# Patient Record
Sex: Male | Born: 1935 | Race: Black or African American | Hispanic: No | Marital: Married | Smoking: Former smoker
Health system: Southern US, Community
[De-identification: ages and names within clinical notes are randomized; demographics above are authoritative.]

## PROBLEM LIST (undated history)

## (undated) DIAGNOSIS — C61 Malignant neoplasm of prostate: Secondary | ICD-10-CM

## (undated) DIAGNOSIS — I639 Cerebral infarction, unspecified: Secondary | ICD-10-CM

## (undated) DIAGNOSIS — Z87891 Personal history of nicotine dependence: Secondary | ICD-10-CM

## (undated) DIAGNOSIS — J449 Chronic obstructive pulmonary disease, unspecified: Secondary | ICD-10-CM

## (undated) DIAGNOSIS — I447 Left bundle-branch block, unspecified: Secondary | ICD-10-CM

## (undated) DIAGNOSIS — E119 Type 2 diabetes mellitus without complications: Secondary | ICD-10-CM

## (undated) DIAGNOSIS — I429 Cardiomyopathy, unspecified: Secondary | ICD-10-CM

## (undated) HISTORY — DX: Cerebral infarction, unspecified: I63.9

## (undated) HISTORY — PX: PROSTATE SURGERY: SHX751

## (undated) HISTORY — DX: Malignant neoplasm of prostate: C61

---

## 2000-08-21 ENCOUNTER — Encounter: Admission: RE | Admit: 2000-08-21 | Discharge: 2000-11-19 | Payer: Self-pay | Admitting: Cardiology

## 2001-01-01 ENCOUNTER — Emergency Department (HOSPITAL_COMMUNITY): Admission: EM | Admit: 2001-01-01 | Discharge: 2001-01-01 | Payer: Self-pay | Admitting: Emergency Medicine

## 2005-12-12 ENCOUNTER — Ambulatory Visit: Payer: Self-pay | Admitting: Internal Medicine

## 2006-01-11 ENCOUNTER — Ambulatory Visit: Payer: Self-pay | Admitting: Internal Medicine

## 2006-08-24 ENCOUNTER — Encounter (HOSPITAL_COMMUNITY): Admission: RE | Admit: 2006-08-24 | Discharge: 2006-08-24 | Payer: Self-pay | Admitting: Cardiology

## 2006-12-31 ENCOUNTER — Ambulatory Visit: Payer: Self-pay | Admitting: Internal Medicine

## 2007-02-26 ENCOUNTER — Emergency Department (HOSPITAL_COMMUNITY): Admission: EM | Admit: 2007-02-26 | Discharge: 2007-02-26 | Payer: Self-pay | Admitting: Emergency Medicine

## 2007-05-06 ENCOUNTER — Emergency Department (HOSPITAL_COMMUNITY): Admission: EM | Admit: 2007-05-06 | Discharge: 2007-05-06 | Payer: Self-pay | Admitting: Family Medicine

## 2007-05-27 ENCOUNTER — Ambulatory Visit: Payer: Self-pay | Admitting: Internal Medicine

## 2007-12-02 DIAGNOSIS — H409 Unspecified glaucoma: Secondary | ICD-10-CM | POA: Insufficient documentation

## 2007-12-02 DIAGNOSIS — J439 Emphysema, unspecified: Secondary | ICD-10-CM

## 2007-12-02 DIAGNOSIS — Z8546 Personal history of malignant neoplasm of prostate: Secondary | ICD-10-CM | POA: Insufficient documentation

## 2007-12-04 ENCOUNTER — Encounter: Payer: Self-pay | Admitting: Internal Medicine

## 2007-12-14 ENCOUNTER — Inpatient Hospital Stay (HOSPITAL_COMMUNITY): Admission: AD | Admit: 2007-12-14 | Discharge: 2007-12-18 | Payer: Self-pay | Admitting: Cardiology

## 2007-12-14 ENCOUNTER — Encounter: Payer: Self-pay | Admitting: Emergency Medicine

## 2008-01-02 ENCOUNTER — Encounter: Payer: Self-pay | Admitting: Internal Medicine

## 2010-11-12 ENCOUNTER — Encounter: Payer: Self-pay | Admitting: Cardiology

## 2011-03-07 NOTE — Assessment & Plan Note (Signed)
[facility]                             PULMONARY OFFICE NOTE   BURYL, Dustin Leonard                       MRN:          [id number]  DATE:05/27/2007                            DOB:          Aug 09, 1929    PROBLEM LIST:  1. Chronic obstructive pulmonary disease.  2. History of prostate cancer resection in 1998.  3. Glaucoma.   HISTORY:  He had bronchitis treated at [facility] July 14  with Avelox and Mucinex. He says he is now back to normal. Chest x-ray  May 6 had questioned interstitial densities left lower lobe. Chest x-ray  July 14 at his urgent care visit described stable opacities left base  considering recurrent air space disease or scarring not entirely  excluding a mass. This was explained to him and his daughter. He just  uses albuterol now and on an occasional p.r.n. basis.   MEDICATIONS:  1. Glaucoma eye drops.  2. Metformin 1000 mg b.i.d.  3. Albuterol rescue inhaler.   No medication allergy.   OBJECTIVE:  Weight 174 pounds, blood pressure 124/68, pulse 87, room air  saturation 99%.  I felt a very occasional extra beat, no murmur.  Chest sounded clear.  I found no adenopathy, no edema.   IMPRESSION:  Chronic obstructive pulmonary disease/asthma with recent  exacerbation. Question abnormal x-ray, probably some residual scarring  but appropriate to followup.   PLAN:  1. Schedule PFT.  2. Schedule return in 6 months, earlier p.r.n.     Clinton D. Maple Hudson, MD, Tonny Bollman, FACP  Electronically Signed    CDY/MedQ  DD: 05/27/2007  DT: 05/28/2007  Job #: [id number]   cc:   [name], M.D.

## 2011-03-10 NOTE — Assessment & Plan Note (Signed)
[facility]                             PULMONARY OFFICE NOTE   Dustin, Leonard                       MRN:          [id number]  DATE:12/31/2006                            DOB:          1929-02-17    PROBLEMS:  1. Chronic obstructive pulmonary disease.  2. History of prostate cancer resection in 1998.  3. Glaucoma.   HISTORY:  One year follow up. He reports a good winter. No flu syndrome.  No routine cough. Occasional scant white sputum. No chest pain or blood.  He feels comfortable with routine walking, but gets short of breath if  he tries to hurry or climb stairs.   MEDICATIONS:  1. Lipitor 10 mg.  2. Flovent 44 2 puffs b.i.d.  3. Rescue albuterols used p.r.n.  4. Glimepiride 2 mg.  5. Alphagan.  6. Systone eye drops.  7. Alcon eye drops.   ALLERGIES:  No medication allergy.   OBJECTIVE:  Weight 181 pounds, blood pressure 122/72, pulse regular 86,  room air saturation 99% at rest. Trace wheeze left mid back, cleared  with a little cough. There is no rhonchi or dullness. Work of breathing  was not increased. Heart sounds were regular without murmur. I found no  adenopathy, neck vein distension, or edema.   IMPRESSION:  Asthma with chronic obstructive pulmonary disease,  clinically stable.   PLAN:  We refilled his rescue albuterol inhaler, discussed options, and  scheduled return for one year. He will be needing some reassessment  including pulmonary function tests at that time.     Clinton D. Maple Hudson, MD, Tonny Bollman, FACP  Electronically Signed    CDY/MedQ  DD: 01/05/2007  DT: 01/07/2007  Job #: [id number]   cc:   [name], M.D.

## 2011-03-10 NOTE — Discharge Summary (Signed)
NAME:  Dustin Leonard, Dustin Leonard              ACCOUNT NO.:  [id number]   MEDICAL RECORD NO.:  [id number]          PATIENT TYPE:  INP   LOCATION:  1418                         FACILITY:  [facility]   PHYSICIAN:  [name], M.D.DATE OF BIRTH:  1928/10/27   DATE OF ADMISSION:  12/14/2007  DATE OF DISCHARGE:  12/18/2007                               DISCHARGE SUMMARY   ADMISSION DIAGNOSES:  1. Chronic obstructive pulmonary disease, asthma.  2. Type 2 diabetes.  3. Uncontrolled hypertension.   DISCHARGE DIAGNOSES:  1. Status asthmaticus.  2. Uncontrolled type 2 diabetes.  3. Hypertension.   BRIEF HISTORY/REASON FOR ADMISSION:  This 77 year old gentleman with a  long history of asthma was admitted to the hospital in my absence by Dr.  Orpah Cobb for complaints of severe dyspnea, cough of several days'  duration.  The patient also complained of some wheezing and it did not  improve with his usual inhaler treatment at home.  He came to the  hospital emergency room, was evaluated by Dr. Algie Coffer, and subsequently  admitted to the hospital for further evaluation and treatment of status  asthmaticus.   LABORATORY STUDIES:  Revealed the following, white blood cell count on  initial presentation on December 15, 2007, was 9300, hemoglobin 12.4,  hematocrit 36.4, serum sodium 134, potassium 3.5, chloride 102, CO2 of  23, glucose 203, BUN 14, creatinine 0.94.  Hemoglobin A1c was 12.9.  Cholesterol was 220 total, the HDL cholesterol was 41, and the LDL  cholesterol was 166.   IMAGING:  Electrocardiogram revealed left bundle branch block and some  ectopics were also noted on the patient's telemetry.   HOSPITAL COURSE:  This 78-year gentleman was admitted to the hospital  for treatment of status asthmaticus after initial presentation.  Baseline laboratory studies were obtained and given in the body of the  patient's chart.  He was started on the following medications, Xopenex,  which was  subsequently discontinued.  He was given Atrovent nebulizers  0.5 mL every 6 hours, and he was started on prednisone 20 mg p.o. daily.   The patient was also given Pulmicort 0.5 mg via nebulizer b.i.d. and  this seemed to improve his status asthmaticus and his dyspnea improved.  He had been given IV Solu-Medrol previously and then also prescribed  Lyrica and Avandaryl.  I also gave his other medications as in his  regimen.   The patient gradually began to improve with shortness of breath.  He was  to ambulate in halls and eventually was started on DuoNeb q.i.d.,  Pulmicort 0.5 mg b.i.d., as well as p.o. steroids.  As his condition  improved, the patient was released to home on the following medications  of Avandaryl 4/2 one tablet p.o. q.a.m., Lyrica 50 mg p.o. b.i.d.,  Nasonex DM 60 mg p.o. three times a day, Proair 2 puffs four times a  day, Travatan  0.004 in his left eye at night sustained drops in his eye  as needed.  He was also prescribed home nebulizer with DuoNeb q.i.d. and  Pulmicort 0.5 mg via nebulizer b.i.d.  He will be seen back  in office in  2 weeks.   DISPOSITION AT DISCHARGE:  The patient's condition improved.  He will be  given Ceftin 250 mg p.o. b.i.d. for 4 days and prednisone 10 mg daily  until he is seen back in the office.   The patient is also noted to have some ventricular ectopics as in the  short runs of nonsustained ventricular tachycardia while hospitalized.  This also will be addressed when he is seen in the office.      [name], M.D.  Electronically Signed     JOS/MEDQ  D:  02/05/2008  T:  02/06/2008  Job:  [id number]

## 2011-03-14 ENCOUNTER — Other Ambulatory Visit: Payer: Self-pay | Admitting: Emergency Medicine

## 2011-03-14 ENCOUNTER — Inpatient Hospital Stay (HOSPITAL_COMMUNITY)
Admission: EM | Admit: 2011-03-14 | Discharge: 2011-03-17 | DRG: 065 | Disposition: A | Discharge status: Home Health | Payer: Medicare Other | Attending: Neurology | Admitting: Neurology

## 2011-03-14 ENCOUNTER — Emergency Department (HOSPITAL_COMMUNITY): Payer: Medicare Other

## 2011-03-14 ENCOUNTER — Inpatient Hospital Stay (INDEPENDENT_AMBULATORY_CARE_PROVIDER_SITE_OTHER)
Admission: RE | Admit: 2011-03-14 | Discharge: 2011-03-14 | Disposition: A | Discharge status: Home / Self Care | Payer: Medicare Other | Source: Ambulatory Visit | Attending: Family Medicine | Admitting: Family Medicine

## 2011-03-14 DIAGNOSIS — J4489 Other specified chronic obstructive pulmonary disease: Secondary | ICD-10-CM | POA: Diagnosis present

## 2011-03-14 DIAGNOSIS — J449 Chronic obstructive pulmonary disease, unspecified: Secondary | ICD-10-CM | POA: Diagnosis present

## 2011-03-14 DIAGNOSIS — Z79899 Other long term (current) drug therapy: Secondary | ICD-10-CM

## 2011-03-14 DIAGNOSIS — H409 Unspecified glaucoma: Secondary | ICD-10-CM | POA: Diagnosis present

## 2011-03-14 DIAGNOSIS — G459 Transient cerebral ischemic attack, unspecified: Secondary | ICD-10-CM

## 2011-03-14 DIAGNOSIS — I447 Left bundle-branch block, unspecified: Secondary | ICD-10-CM | POA: Diagnosis present

## 2011-03-14 DIAGNOSIS — Z8546 Personal history of malignant neoplasm of prostate: Secondary | ICD-10-CM

## 2011-03-14 DIAGNOSIS — E119 Type 2 diabetes mellitus without complications: Secondary | ICD-10-CM

## 2011-03-14 DIAGNOSIS — Z87891 Personal history of nicotine dependence: Secondary | ICD-10-CM

## 2011-03-14 DIAGNOSIS — Z006 Encounter for examination for normal comparison and control in clinical research program: Secondary | ICD-10-CM

## 2011-03-14 DIAGNOSIS — I10 Essential (primary) hypertension: Secondary | ICD-10-CM | POA: Diagnosis present

## 2011-03-14 DIAGNOSIS — Z7982 Long term (current) use of aspirin: Secondary | ICD-10-CM

## 2011-03-14 DIAGNOSIS — Z9079 Acquired absence of other genital organ(s): Secondary | ICD-10-CM

## 2011-03-14 DIAGNOSIS — R4789 Other speech disturbances: Secondary | ICD-10-CM | POA: Diagnosis present

## 2011-03-14 DIAGNOSIS — I6350 Cerebral infarction due to unspecified occlusion or stenosis of unspecified cerebral artery: Principal | ICD-10-CM | POA: Diagnosis present

## 2011-03-14 DIAGNOSIS — I428 Other cardiomyopathies: Secondary | ICD-10-CM | POA: Diagnosis present

## 2011-03-14 DIAGNOSIS — E7800 Pure hypercholesterolemia, unspecified: Secondary | ICD-10-CM | POA: Diagnosis present

## 2011-03-14 LAB — COMPREHENSIVE METABOLIC PANEL
Albumin: 3.2 g/dL — ABNORMAL LOW (ref 3.5–5.2)
BUN: 18 mg/dL (ref 6–23)
Calcium: 9.3 mg/dL (ref 8.4–10.5)
Creatinine, Ser: 1.01 mg/dL (ref 0.4–1.5)
Total Protein: 6.8 g/dL (ref 6.0–8.3)

## 2011-03-14 LAB — DIFFERENTIAL
Basophils Relative: 0 % (ref 0–1)
Eosinophils Absolute: 0.1 10*3/uL (ref 0.0–0.7)
Lymphs Abs: 3 10*3/uL (ref 0.7–4.0)
Neutrophils Relative %: 53 % (ref 43–77)

## 2011-03-14 LAB — PROTIME-INR
INR: 1.06 (ref 0.00–1.49)
Prothrombin Time: 14 seconds (ref 11.6–15.2)

## 2011-03-14 LAB — POCT I-STAT, CHEM 8
Calcium, Ion: 1.08 mmol/L — ABNORMAL LOW (ref 1.12–1.32)
Chloride: 106 mEq/L (ref 96–112)
Chloride: 105 mEq/L (ref 96–112)
HCT: 43 % (ref 39.0–52.0)
HCT: 41 % (ref 39.0–52.0)
Potassium: 4.1 mEq/L (ref 3.5–5.1)
Sodium: 136 mEq/L (ref 135–145)

## 2011-03-14 LAB — CBC
MCH: 29.2 pg (ref 26.0–34.0)
MCV: 86.7 fL (ref 78.0–100.0)
Platelets: 138 10*3/uL — ABNORMAL LOW (ref 150–400)
RDW: 13.2 % (ref 11.5–15.5)
WBC: 8.3 10*3/uL (ref 4.0–10.5)

## 2011-03-14 LAB — GLUCOSE, CAPILLARY: Glucose-Capillary: 252 mg/dL — ABNORMAL HIGH (ref 70–99)

## 2011-03-14 LAB — CK TOTAL AND CKMB (NOT AT ARMC)
CK, MB: 1.5 ng/mL (ref 0.3–4.0)
Total CK: 56 U/L (ref 7–232)

## 2011-03-15 ENCOUNTER — Inpatient Hospital Stay (HOSPITAL_COMMUNITY): Payer: Medicare Other

## 2011-03-15 DIAGNOSIS — I6350 Cerebral infarction due to unspecified occlusion or stenosis of unspecified cerebral artery: Secondary | ICD-10-CM

## 2011-03-15 LAB — CBC
HCT: 40.8 % (ref 39.0–52.0)
Hemoglobin: 13.7 g/dL (ref 13.0–17.0)
MCV: 86.1 fL (ref 78.0–100.0)
RDW: 13.2 % (ref 11.5–15.5)
WBC: 6.2 10*3/uL (ref 4.0–10.5)

## 2011-03-15 LAB — COMPREHENSIVE METABOLIC PANEL
Alkaline Phosphatase: 72 U/L (ref 39–117)
BUN: 13 mg/dL (ref 6–23)
CO2: 24 mEq/L (ref 19–32)
Chloride: 103 mEq/L (ref 96–112)
GFR calc non Af Amer: >60 mL/min (ref >60)
Glucose, Bld: 122 mg/dL — ABNORMAL HIGH (ref 70–99)
Potassium: 4.1 mEq/L (ref 3.5–5.1)
Total Bilirubin: 0.4 mg/dL (ref 0.3–1.2)
Total Protein: 6.5 g/dL (ref 6.0–8.3)

## 2011-03-15 LAB — GLUCOSE, CAPILLARY
Glucose-Capillary: 154 mg/dL — ABNORMAL HIGH (ref 70–99)
Glucose-Capillary: 261 mg/dL — ABNORMAL HIGH (ref 70–99)

## 2011-03-15 LAB — URINALYSIS, ROUTINE W REFLEX MICROSCOPIC
Ketones, ur: NEGATIVE mg/dL
Leukocytes, UA: NEGATIVE
Nitrite: NEGATIVE
Specific Gravity, Urine: 1.024 (ref 1.005–1.030)
pH: 5 (ref 5.0–8.0)

## 2011-03-15 LAB — URINE MICROSCOPIC-ADD ON

## 2011-03-15 LAB — HEMOGLOBIN A1C
Hgb A1c MFr Bld: 10.8 % — ABNORMAL HIGH (ref <5.7)
Mean Plasma Glucose: 263 mg/dL — ABNORMAL HIGH (ref <117)

## 2011-03-15 LAB — LIPID PANEL
Cholesterol: 225 mg/dL — ABNORMAL HIGH (ref 0–200)
LDL Cholesterol: 161 mg/dL — ABNORMAL HIGH (ref 0–99)

## 2011-03-15 NOTE — Consult Note (Signed)
NAME:  Dustin Leonard, Dustin Leonard              ACCOUNT NO.:  [id number]  MEDICAL RECORD NO.:  [id number]           PATIENT TYPE:  I  LOCATION:  3041                         FACILITY:  [facility]  PHYSICIAN:  Levie Heritage, MD       DATE OF BIRTH:  [birth year]  DATE OF CONSULTATION:  03/14/2011 DATE OF DISCHARGE:                                CONSULTATION   REFERRING PHYSICIAN:  ER Team.  REASON FOR CONSULTATION:  Stroke code.  CHIEF COMPLAINT:  Sudden onset of slurring of speech.  HISTORY OF PRESENT ILLNESS:  This patient is a 77 year old African American man who noticed some overall not feeling well around 8 o'clock this morning, but around 12 noon time, he was witnessed to have obvious slurring of speech and was brought to Urgent Care Clinic who referred him here.  He reached here around close to 5 p.m. out of the TPA window. A CT scan of the head was performed that did show low-density area in the left basal ganglia reflective of possible subacute infarct.  There was no other neurological deficit except than dysarthria.  The NIH stroke scale was 1.  The TPA was not offered because of out of the TPA window.  PAST MEDICAL HISTORY:  COPD, asthma, type 2 diabetes mellitus, uncontrolled hypertension.  ALLERGIES:  No known drug allergies.  MEDICATIONS:  The patient affirmed that he takes baby aspirin 81 mg every day, however, he will provide the list of detail medications later on.  Currently, there is no close family relative around to provide the list of medications.  FAMILY HISTORY:  Multiple family members with diabetes mellitus.  SOCIAL HISTORY:  Nonsmoker, nondrinker, no drug abuse.  Lives with his spouse.  REVIEW OF SYSTEMS:  Currently, denies any shortness of breath, denies any chest pain.  Denies any nausea, vomiting, diarrhea.  Denies any recent weight loss.  Denies any burning urination.  Denies any fevers. Denies any rashes.  Denies any motor or sensory deficits or any  visual changes.  He only has problem speaking.  Rest of the review of systems unremarkable except noted above in the HPI.  CLINICAL DATA:  His labs are still pending.  His CT scan of the head of the report is still pending, but I have reviewed the CT scan in the CAT scan suite, did not see any intracranial hemorrhage and have seen a low- density area in the left basal ganglia.  PHYSICAL EXAMINATION:  VITAL SIGNS:  His blood pressure 137/78 mmHg with a pulse of 89 per minute. GENERAL:  His NIH stroke scale at 5 p.m. is 104 dysarthria only.  Alert and oriented x3 in no acute distress. HEENT:  Bilateral pupils reactive to light and accommodation. MUSCULOSKELETAL:  It is important to mention that he has mild chronic or possibly congenital weakness of left later rectus other than that the cranial nerves are intact completely. Motor examination reveals 5/5 all over. The sensory examination is intact, admits to feel light touch sensation equally symmetrical and normal all over. The gait was deferred.  IMPRESSION:  A 77 year old man with risk factor of diabetes mellitus and hypertension,  had sudden onset of slurring of speech which probably started 8 a.m. this morning, but was obvious around noon today and the CT scan is showing the low-density area in the left hemisphere.  My impression is that his subacute stroke is likely the result of small-vessel disease, and he is out of a TPA window period.  Therefore it is not offered.  PLAN: 1. Please give the patient 75 mg of daily Plavix as he has been on     aspirin before. 2. Permissive hypertension for today, not to treat unless systolic is     more than to 200 mmHg. 3. Please send lipid panel HbA1c. 4. MRI and MRA of the brain with Doppler carotid and cardiac echo. 5. Stroke Team will follow the patient up from tomorrow morning, I     will proceed as the case progresses.          ______________________________ Levie Heritage,  MD     WS/MEDQ  D:  03/14/2011  T:  03/15/2011  Job:  [id number]  Electronically Signed by Levie Heritage MD on 03/15/2011 03:14:40 PM

## 2011-03-16 LAB — GLUCOSE, CAPILLARY
Glucose-Capillary: 169 mg/dL — ABNORMAL HIGH (ref 70–99)
Glucose-Capillary: 167 mg/dL — ABNORMAL HIGH (ref 70–99)
Glucose-Capillary: 183 mg/dL — ABNORMAL HIGH (ref 70–99)

## 2011-03-17 LAB — GLUCOSE, CAPILLARY: Glucose-Capillary: 168 mg/dL — ABNORMAL HIGH (ref 70–99)

## 2011-03-18 ENCOUNTER — Inpatient Hospital Stay (HOSPITAL_COMMUNITY)
Admission: EM | Admit: 2011-03-18 | Discharge: 2011-03-22 | DRG: 065 | Disposition: A | Discharge status: Home / Self Care | Payer: Medicare Other | Attending: Cardiology | Admitting: Cardiology

## 2011-03-18 ENCOUNTER — Emergency Department (HOSPITAL_COMMUNITY): Payer: Medicare Other

## 2011-03-18 DIAGNOSIS — J449 Chronic obstructive pulmonary disease, unspecified: Secondary | ICD-10-CM | POA: Diagnosis present

## 2011-03-18 DIAGNOSIS — R29810 Facial weakness: Secondary | ICD-10-CM | POA: Diagnosis present

## 2011-03-18 DIAGNOSIS — I10 Essential (primary) hypertension: Secondary | ICD-10-CM | POA: Diagnosis present

## 2011-03-18 DIAGNOSIS — E7800 Pure hypercholesterolemia, unspecified: Secondary | ICD-10-CM | POA: Diagnosis present

## 2011-03-18 DIAGNOSIS — I447 Left bundle-branch block, unspecified: Secondary | ICD-10-CM | POA: Diagnosis present

## 2011-03-18 DIAGNOSIS — J4489 Other specified chronic obstructive pulmonary disease: Secondary | ICD-10-CM | POA: Diagnosis present

## 2011-03-18 DIAGNOSIS — R471 Dysarthria and anarthria: Secondary | ICD-10-CM | POA: Diagnosis present

## 2011-03-18 DIAGNOSIS — E119 Type 2 diabetes mellitus without complications: Secondary | ICD-10-CM | POA: Diagnosis present

## 2011-03-18 DIAGNOSIS — Z87891 Personal history of nicotine dependence: Secondary | ICD-10-CM

## 2011-03-18 DIAGNOSIS — I428 Other cardiomyopathies: Secondary | ICD-10-CM | POA: Diagnosis present

## 2011-03-18 DIAGNOSIS — Z7982 Long term (current) use of aspirin: Secondary | ICD-10-CM

## 2011-03-18 DIAGNOSIS — I6350 Cerebral infarction due to unspecified occlusion or stenosis of unspecified cerebral artery: Principal | ICD-10-CM | POA: Diagnosis present

## 2011-03-18 DIAGNOSIS — Z8673 Personal history of transient ischemic attack (TIA), and cerebral infarction without residual deficits: Secondary | ICD-10-CM

## 2011-03-18 LAB — DIFFERENTIAL
Basophils Absolute: 0 10*3/uL (ref 0.0–0.1)
Eosinophils Relative: 2 % (ref 0–5)
Lymphocytes Relative: 32 % (ref 12–46)
Lymphs Abs: 2.2 10*3/uL (ref 0.7–4.0)
Neutro Abs: 3.7 10*3/uL (ref 1.7–7.7)
Neutrophils Relative %: 53 % (ref 43–77)

## 2011-03-18 LAB — CK TOTAL AND CKMB (NOT AT ARMC): CK, MB: 1.6 ng/mL (ref 0.3–4.0)

## 2011-03-18 LAB — URINALYSIS, ROUTINE W REFLEX MICROSCOPIC
Ketones, ur: 15 mg/dL — AB
Leukocytes, UA: NEGATIVE
Nitrite: NEGATIVE
Specific Gravity, Urine: 1.035 — ABNORMAL HIGH (ref 1.005–1.030)
Urobilinogen, UA: 1 mg/dL (ref 0.0–1.0)

## 2011-03-18 LAB — CBC
HCT: 42.1 % (ref 39.0–52.0)
MCV: 86.8 fL (ref 78.0–100.0)
RBC: 4.85 MIL/uL (ref 4.22–5.81)
RDW: 12.9 % (ref 11.5–15.5)
WBC: 7 10*3/uL (ref 4.0–10.5)

## 2011-03-18 LAB — BASIC METABOLIC PANEL
Chloride: 100 mEq/L (ref 96–112)
GFR calc non Af Amer: >60 mL/min (ref >60)
Potassium: 4.8 mEq/L (ref 3.5–5.1)
Sodium: 134 mEq/L — ABNORMAL LOW (ref 135–145)

## 2011-03-18 LAB — URINE MICROSCOPIC-ADD ON

## 2011-03-18 LAB — PROTIME-INR: INR: 1.04 (ref 0.00–1.49)

## 2011-03-18 LAB — TROPONIN I: Troponin I: <0.3 ng/mL (ref <0.30)

## 2011-03-18 LAB — GLUCOSE, CAPILLARY

## 2011-03-18 LAB — APTT: aPTT: 29 seconds (ref 24–37)

## 2011-03-19 ENCOUNTER — Inpatient Hospital Stay (HOSPITAL_COMMUNITY): Payer: Medicare Other

## 2011-03-19 ENCOUNTER — Other Ambulatory Visit (HOSPITAL_COMMUNITY): Payer: Medicare Other

## 2011-03-19 LAB — CBC
HCT: 36.4 % — ABNORMAL LOW (ref 39.0–52.0)
Hemoglobin: 11.8 g/dL — ABNORMAL LOW (ref 13.0–17.0)
RBC: 4.2 MIL/uL — ABNORMAL LOW (ref 4.22–5.81)
WBC: 5.2 10*3/uL (ref 4.0–10.5)

## 2011-03-19 LAB — BASIC METABOLIC PANEL
CO2: 24 mEq/L (ref 19–32)
Chloride: 104 mEq/L (ref 96–112)
GFR calc non Af Amer: >60 mL/min (ref >60)
Glucose, Bld: 208 mg/dL — ABNORMAL HIGH (ref 70–99)
Potassium: 4 mEq/L (ref 3.5–5.1)
Sodium: 136 mEq/L (ref 135–145)

## 2011-03-19 LAB — GLUCOSE, CAPILLARY: Glucose-Capillary: 228 mg/dL — ABNORMAL HIGH (ref 70–99)

## 2011-03-20 ENCOUNTER — Inpatient Hospital Stay (HOSPITAL_COMMUNITY): Payer: Medicare Other

## 2011-03-20 LAB — GLUCOSE, CAPILLARY
Glucose-Capillary: 203 mg/dL — ABNORMAL HIGH (ref 70–99)
Glucose-Capillary: 159 mg/dL — ABNORMAL HIGH (ref 70–99)
Glucose-Capillary: 187 mg/dL — ABNORMAL HIGH (ref 70–99)

## 2011-03-20 LAB — URINE CULTURE: Colony Count: 5000

## 2011-03-21 LAB — CBC
MCH: 28.5 pg (ref 26.0–34.0)
MCHC: 33 g/dL (ref 30.0–36.0)
Platelets: 144 10*3/uL — ABNORMAL LOW (ref 150–400)

## 2011-03-21 LAB — CULTURE, BLOOD (ROUTINE X 2)

## 2011-03-21 LAB — BASIC METABOLIC PANEL
Calcium: 8.5 mg/dL (ref 8.4–10.5)
Creatinine, Ser: 0.94 mg/dL (ref 0.4–1.5)
GFR calc Af Amer: >60 mL/min (ref >60)

## 2011-03-21 LAB — GLUCOSE, CAPILLARY: Glucose-Capillary: 173 mg/dL — ABNORMAL HIGH (ref 70–99)

## 2011-03-22 LAB — GLUCOSE, CAPILLARY
Glucose-Capillary: 198 mg/dL — ABNORMAL HIGH (ref 70–99)
Glucose-Capillary: 246 mg/dL — ABNORMAL HIGH (ref 70–99)
Glucose-Capillary: 136 mg/dL — ABNORMAL HIGH (ref 70–99)

## 2011-04-13 NOTE — Discharge Summary (Signed)
NAME:  SANJIV, CASTORENA              ACCOUNT NO.:  [id number]  MEDICAL RECORD NO.:  [id number]           PATIENT TYPE:  I  LOCATION:  3035                         FACILITY:  [facility]  PHYSICIAN:  Eldra Word N. Sharyn Lull, M.D. 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  DISCHARGE DIAGNOSES:  A status post left corona radiata cerebrovascular accident, hypertension, dilated cardiomyopathy, diabetes mellitus, hypercholesteremia, chronic obstructive pulmonary disease.  DISCHARGE MEDICATIONS: 1. Enteric-coated aspirin 81 mg 1 tablet daily. 2. Plavix 75 mg 1 tablet daily. 3. Ramipril 2.5 mg 1 tablet daily. 4. Coreg 3.125 mg 1 tablet twice daily. 5. Crestor 20 mg tablet daily. 6. Pepcid 20 mg 1 tablet twice daily.  DIET:  Low-salt, low-cholesterol 1800 calories ADA diet.  The patient has been advised to monitor BP and blood sugar daily.  Follow up with me in 1 week.  Follow up with Dr. Pearlean Brownie in 2 weeks.  Condition at discharge is stable.  BRIEF HISTORY AND HOSPITAL COURSE:  Mr. Ouzts is a 77 year old black male with past medical history significant for left subcortical infarct with dysarthria, improved during hospitalization; hypertension; diabetes mellitus; hypercholesteremia; dilated cardiomyopathy was admitted by Dr. Algie Coffer on Mar 18, 2011, because of recurrence of slurred speech and slight difficulty in walking.  There was no clear hemiparesis.  Right facial weakness was noted, however, he was brought back to the emergency room for evaluation.  CT of his head showed expected interval changes and left subcortical infarct but no signs of recurrent infarction or no signs of hemorrhage.  NIH stroke score was 2.  The patient was on point study, but due to recurrent  symptoms, the patient was started on Plavix and point study was discontinued.  PAST MEDICAL HISTORY:  As above.  Family history is noncontributory.  SOCIAL HISTORY:  He is married, retired, lives with his wife.  Quit smoking 15+ years ago.  ALLERGIES:  No known drug allergies.  MEDICATION AT HOME:  He was on Crestor, ramipril, Protonix, metformin, Coreg, aspirin and point study at home.  PHYSICAL EXAMINATION:  GENERAL:  He was awake, dysarthric. VITAL SIGNS:  Blood pressure was 130/70, pulse was 70 regular. HEENT:  Conjunctivae was pink. NECK:  Supple, no JVD, no bruit. LUNGS:  Clear bilaterally. CARDIOVASCULAR:  S1, S2 was normal.  There was soft systolic murmur. EXTREMITIES:  There is no clubbing, cyanosis or edema. NEUROLOGIC:  Grossly intact except slurred speech.  LABORATORY DATA:  His hemoglobin was 11.8, hematocrit 36.4, white count of 5.2.  Sodium was 136, potassium 4.0, glucose 208, BUN 14, creatinine 0.91.  Blood cultures were negative.  Urine cultures were negative.  MRI of the brain showed increase in size of the infarct involving the superior aspect of the left basal ganglia and left corona radiata. There was no evidence of intracranial hemorrhage, mild small vessel disease.  BRIEF HOSPITAL COURSE:  The patient was admitted to telemetry unit. Neurology consultation was obtained.  Repeat MRI was  obtained, which showed slight progression of infarct.  Speech therapy and OT/PT consultation was obtained.  The patient was started on Plavix and low- dose aspirin.  The patient's speech improved.  The patient did not have any further episodes of neuro deficits.  The patient will be discharged home on above medications.  Initially family agreed for point study, but finally after discussing his wife with her son, they decided to stop the point study and wanted to be on Plavix and aspirin.  The patient will be discharged home on above medications and will be followed up in  my office in 1 week and Neurology in 2 weeks.     Eduardo Osier. Sharyn Lull, M.D.     MNH/MEDQ  D:  03/22/2011  T:  03/23/2011  Job:  [id number]  Electronically Signed by Rinaldo Cloud M.D. on 04/13/2011 09:38:25 AM

## 2011-04-13 NOTE — Discharge Summary (Signed)
NAME:  Dustin Leonard, VARMA              ACCOUNT NO.:  [id number]  MEDICAL RECORD NO.:  [id number]           PATIENT TYPE:  I  LOCATION:  3041                         FACILITY:  [facility]  PHYSICIAN:  Derold Dorsch N. Sharyn Lull, M.D. DATE OF BIRTH:  [birth year]  DATE OF ADMISSION:  03/14/2011 DATE OF DISCHARGE:  03/17/2011                              DISCHARGE SUMMARY   ADMITTING DIAGNOSES:  Sudden onset of dysarthria, rule out stroke, non- insulin-dependent diabetes mellitus, hypertension, hypercholesteremia, remote tobacco abuse, left bundle-branch block old, history of bronchial asthma, and chronic obstructive pulmonary disease.  DISCHARGE DIAGNOSES:  Status post left subcortical infarct, small vessel disease, hypertension, diabetes mellitus, dilated cardiomyopathy, history of chronic obstructive pulmonary disease, bronchial asthma, left bundle-branch block, remote tobacco abuse, hypercholesteremia.  DISCHARGE HOME MEDICATIONS: 1. Enteric-coated aspirin 325 mg 1 tablet daily plus point of care     trial medications. 2. Carvedilol 3.125 mg 1 tablet twice daily. 3. Ramipril 2.5 mg 1 capsule daily. 4. Crestor 20 mg 1 tablet daily. 5. Protonix 40 mg 1 tablet daily. 6. Glucophage 500 mg 1 tablet twice daily.  DIET:  Low salt, low cholesterol 1800 calories ADA diet.  The patient has been advised to monitor blood sugar and blood pressure daily. Follow up with me in 1 week.  Follow up with neuro, Dr. Pearlean Brownie in 2 weeks.  CONDITION AT DISCHARGE:  Stable.  OT/PT has been arranged as outpatient. Discussed with his wife if he develops any further neuro changes, give him extra dose of regular aspirin to chew and call 911 immediately.  BRIEF HISTORY AND HOSPITAL COURSE:  Dustin Leonard is a 77 year old black male with past medical history significant for hypertension, non-insulin- dependent diabetes mellitus, history of bronchial asthma, COPD, remote tobacco abuse, came to the ER because of sudden onset of  slurred speech associated with confusion and inability to follow commands.  Denies any weakness in the arms or legs.  Denies any blurring of vision or gait problems.  The patient came to the ER more than 6 hours after his symptoms onset, seen by neuro and was felt not the candidate for thrombolytic therapy.  The patient, however, is enrolled in point trial. The patient denies any chest pain, nausea, vomiting diaphoresis.  Denies PND, orthopnea, or leg swelling.  Denies fever or chills.  Denies fall or trauma.  PAST MEDICAL HISTORY:  As above.  He also had history of CA of prostate and glaucoma of his eyes and also has hypercholesteremia.  PAST SURGICAL HISTORY:  Had prostatectomy for CA of prostate in the past.  SOCIAL HISTORY:  He is married, retired, worked as Designer, industrial/product in the past.  He smoked 1 pack per day for 10 years, quit 50+ years ago. No history of alcohol abuse.  FAMILY HISTORY:  Noncontributory.  MEDICATIONS AT HOME:  He was on aspirin and Amaryl and questionable blood pressure medicine.  PHYSICAL EXAMINATION:  GENERAL:  He was awake but confused, follows commands. VITAL SIGNS:  Blood pressure was 132/68, pulse was 97 and regular. HEENT:  Eyes:  Conjunctivae were pink.  There was no facial asymmetry. NECK:  Supple, no JVD.  There was no bruit. LUNGS:  Clear to auscultation without rhonchi or rales. CARDIOVASCULAR:  S1, S2 is normal.  There was soft systolic murmur. There was no history of gallop or rub. ABDOMEN:  Soft.  Bowel sounds were present, nontender. EXTREMITIES:  There is no clubbing, cyanosis, or edema. NEURO:  Except for slurred speech was grossly intact.  CT of the brain showed nonhemorrhagic infarct of the anterior limb of the left internal capsule.  There was mild generalized atrophy.  MRI of the brain showed acute multifocal deep white matter and basal ganglion infarct on the left as observed on the prior CT.  Systolic function was moderate to  severely reduced, diffuse hypokinesia, EF of 30-35% with no evidence of LA or LV thrombus.  Duplex ultrasound of the carotids showed no significant critical stenosis.  BRIEF HOSPITAL COURSE:  The patient was admitted to neuro floor, was enrolled in point trial.  The patient did not have any further episodes of worsening speech, slurred speech, or any weakness in the arms or legs due to dilated cardiomyopathy.  The patient was started on carvedilol and beta-blockers and ACE inhibitors as per discharge sheet.  The patient will be followed up in my office in 1 week.  We will up titrate his beta-blockers and ACE inhibitors as tolerated.  The patient also will follow with neuro as outpatient.  OT/PT has also been arranged.     Eduardo Osier. Sharyn Lull, M.D.     MNH/MEDQ  D:  03/17/2011  T:  03/18/2011  Job:  [id number]  Electronically Signed by Rinaldo Cloud M.D. on 04/13/2011 09:38:21 AM

## 2011-07-14 LAB — DIFFERENTIAL
Basophils Relative: 0
Monocytes Relative: 5
Neutro Abs: 5.5
Neutrophils Relative %: 71

## 2011-07-14 LAB — BASIC METABOLIC PANEL
CO2: 23
Calcium: 9
Creatinine, Ser: 0.97
GFR calc Af Amer: >60

## 2011-07-14 LAB — CULTURE, BLOOD (ROUTINE X 2): Culture: NO GROWTH

## 2011-07-14 LAB — CBC
MCHC: 33.1
Platelets: 185
RBC: 4.87
WBC: 7.8

## 2011-07-17 LAB — CBC
HCT: 36.4 — ABNORMAL LOW
Hemoglobin: 11.8 — ABNORMAL LOW
Hemoglobin: 12.4 — ABNORMAL LOW
Platelets: 144 — ABNORMAL LOW
Platelets: 159
RBC: 4.16 — ABNORMAL LOW
RDW: 13.8
WBC: 9.3

## 2011-07-17 LAB — CULTURE, RESPIRATORY W GRAM STAIN: Culture: NORMAL

## 2011-07-17 LAB — CULTURE, BLOOD (ROUTINE X 2)
Culture: NO GROWTH
Culture: NO GROWTH

## 2011-07-17 LAB — HEMOGLOBIN A1C: Mean Plasma Glucose: 382

## 2011-07-17 LAB — BASIC METABOLIC PANEL
BUN: 14
Calcium: 8.3 — ABNORMAL LOW
GFR calc Af Amer: >60
GFR calc non Af Amer: >60
GFR calc non Af Amer: >60
Glucose, Bld: 170 — ABNORMAL HIGH
Potassium: 3.5
Sodium: 134 — ABNORMAL LOW
Sodium: 136

## 2011-07-17 LAB — LIPID PANEL
Total CHOL/HDL Ratio: 5.4
VLDL: 13

## 2012-01-01 ENCOUNTER — Ambulatory Visit (INDEPENDENT_AMBULATORY_CARE_PROVIDER_SITE_OTHER)
Admission: RE | Admit: 2012-01-01 | Discharge: 2012-01-01 | Disposition: A | Discharge status: Home / Self Care | Payer: Medicare Other | Source: Ambulatory Visit | Attending: Internal Medicine | Admitting: Internal Medicine

## 2012-01-01 ENCOUNTER — Encounter: Payer: Self-pay | Admitting: Internal Medicine

## 2012-01-01 ENCOUNTER — Ambulatory Visit (INDEPENDENT_AMBULATORY_CARE_PROVIDER_SITE_OTHER): Payer: Medicare Other | Admitting: Internal Medicine

## 2012-01-01 VITALS — BP 130/68 | HR 99 | Ht 68.0 in | Wt 181.6 lb

## 2012-01-01 DIAGNOSIS — J449 Chronic obstructive pulmonary disease, unspecified: Secondary | ICD-10-CM

## 2012-01-01 DIAGNOSIS — H409 Unspecified glaucoma: Secondary | ICD-10-CM

## 2012-01-01 MED ORDER — ALBUTEROL SULFATE HFA 108 (90 BASE) MCG/ACT IN AERS: 2.0000 | INHALATION_SPRAY | Freq: Four times a day (QID) | RESPIRATORY_TRACT | Status: DC | PRN

## 2012-01-01 NOTE — Patient Instructions (Signed)
Order- HC Parking form  Order- CXR- dx COPD  Order- DME Home oximetry assessment- sleep, rest, exertion, dx COPD  Script sent for albuterol rescue inhaler

## 2012-01-01 NOTE — Progress Notes (Signed)
01/01/12- 82 yoM former smoker followed for COPD. Last here 2008. Wife here. PCP Dr Algie Coffer. For Spiriva treat 1999 had shown restriction of exhaled volume with FEV1/FVC 0.73. Medical problems include cardiomyopathy, history of CVA, glaucoma, history of resection of prostate cancer. Smoked one half pack per day for 23 years until quitting in 1973. 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 History of pneumonia. Has had pneumonia vaccine at least twice, and flu vaccine. Told in May of 2012 his heart was only "35%" without history of MI. He gets short of breath especially at night-delays sleep onset. Some cough and wheeze only with colds. Some seasonal allergic rhinitis mainly spring and fall. Has home nebulizer with albuterol is only about once a month. Occasional choked drinking water without active reflux symptoms. He walks slowly and is not very active. Mild exertional dyspnea without much variation from one day to the next.  ROS-see HPI Constitutional:   No-   weight loss, night sweats, fevers, chills, fatigue, lassitude. HEENT:   No-  headaches, difficulty swallowing, tooth/dental problems, sore throat,       No-  sneezing, itching, ear ache, nasal congestion, post nasal drip,  CV:  No-   chest pain, orthopnea, PND, swelling in lower extremities, anasarca, dizziness, palpitations Resp: + shortness of breath with exertion or at rest.              No-   productive cough,  No non-productive cough,  No- coughing up of blood.              No-   change in color of mucus.  No- wheezing.   Skin: No-   rash or lesions. GI:  No-   heartburn, indigestion, abdominal pain, nausea, vomiting,  GU: No-   dysuria,  MS:  No-   joint pain or swelling.    Neuro-     nothing unusual Psych:  No- change in mood or affect. No depression or anxiety.  ? memory loss.  OBJ- Physical Exam General- Alert, Oriented, Affect-appropriate, Distress- none acute, slender Skin- rash-none, lesions- none,  excoriation- none Lymphadenopathy- none Head- atraumatic            Eyes- Gross vision intact, PERRLA, conjunctivae and secretions clear            Ears- Hearing, canals-normal            Nose- Clear, no-Septal dev, mucus, polyps, erosion, perforation             Throat- Mallampati III , mucosa clear , drainage- none, tonsils- atrophic, dentures Neck- flexible , trachea midline, no stridor , thyroid nl, carotid no bruit Chest - symmetrical excursion , unlabored           Heart/CV- RRR , no murmur , no gallop  , no rub, nl s1 s2                           - JVD- 1+ , edema-2+, stasis changes- none, varices- none           Lung- clear to P&A, wheeze- none, cough- mild , dullness-none, rub- none           Chest wall-  Abd- tender-no, distended-no, bowel sounds-present, HSM- no Br/ Gen/ Rectal- Not done, not indicated Extrem- cyanosis- none, clubbing, none, atrophy- none, strength- nl Neuro- droop right lower face, speech slurred

## 2012-01-03 ENCOUNTER — Telehealth: Payer: Self-pay | Admitting: Internal Medicine

## 2012-01-03 NOTE — Telephone Encounter (Signed)
On CY's cart for signature.

## 2012-01-03 NOTE — Telephone Encounter (Signed)
Spoke with patients wife(pinkie)-she is aware that Handicap placard at front for pick up.

## 2012-01-04 NOTE — Assessment & Plan Note (Signed)
Limits use of anticholinergic bronchodilators.

## 2012-01-04 NOTE — Assessment & Plan Note (Signed)
Probably at least moderate COPD. With his facial weakness I doubt he could perform spirometry. Ankle edema may reflect cor pulmonale but should be addressed by his primary physician/cardiologist. Plan-chest x-ray, albuterol rescue inhaler with education, handicapped parking, home oxygen assessment.

## 2012-01-09 NOTE — Progress Notes (Signed)
Quick Note:  Spoke with Pinkie-pt's wife-aware of results. ______

## 2012-01-12 ENCOUNTER — Telehealth: Payer: Self-pay | Admitting: Internal Medicine

## 2012-01-12 DIAGNOSIS — J449 Chronic obstructive pulmonary disease, unspecified: Secondary | ICD-10-CM

## 2012-01-12 NOTE — Telephone Encounter (Signed)
[facility] called [place] to report his ONOX showed desat < 88% for 2 hours at night.  Will order O2 for sleep

## 2012-01-18 ENCOUNTER — Telehealth: Payer: Self-pay | Admitting: Internal Medicine

## 2012-01-18 DIAGNOSIS — J449 Chronic obstructive pulmonary disease, unspecified: Secondary | ICD-10-CM

## 2012-01-18 NOTE — Telephone Encounter (Signed)
[facility] reports recent ONOX on room air, desat below 88% for 2 hours.   Plan- ordering home O2 for sleep   Dx COPD

## 2012-03-06 ENCOUNTER — Telehealth: Payer: Self-pay | Admitting: Internal Medicine

## 2012-03-06 NOTE — Telephone Encounter (Signed)
I spoke with daughter and she is wanting to make sure pt is on correct oxygen at night. i advised her according to epic it is 2l at night. She states when pt comes in for OV she will discuss with CDY how long pt will need to be on this and did not need anything further

## 2012-04-01 ENCOUNTER — Ambulatory Visit (INDEPENDENT_AMBULATORY_CARE_PROVIDER_SITE_OTHER): Payer: Medicare Other | Admitting: Internal Medicine

## 2012-04-01 ENCOUNTER — Encounter: Payer: Self-pay | Admitting: Internal Medicine

## 2012-04-01 VITALS — BP 132/80 | HR 96 | Ht 68.0 in | Wt 181.6 lb

## 2012-04-01 DIAGNOSIS — J449 Chronic obstructive pulmonary disease, unspecified: Secondary | ICD-10-CM

## 2012-04-01 MED ORDER — ALBUTEROL SULFATE HFA 108 (90 BASE) MCG/ACT IN AERS: 2.0000 | INHALATION_SPRAY | Freq: Four times a day (QID) | RESPIRATORY_TRACT | Status: DC | PRN

## 2012-04-01 NOTE — Patient Instructions (Addendum)
Order- schedule PFT  Dx COPD  Script refill albuterol rescue inhaler  Please call as needed

## 2012-04-01 NOTE — Progress Notes (Signed)
01/01/12- 82 yoM former smoker followed for COPD. Last here 2008. Wife here. PCP Dr Algie Coffer. For Spiriva treat 1999 had shown restriction of exhaled volume with FEV1/FVC 0.73. Medical problems include cardiomyopathy, history of CVA, glaucoma, history of resection of prostate cancer. Smoked one half pack per day for 23 years until quitting in 1973. 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 History of pneumonia. Has had pneumonia vaccine at least twice, and flu vaccine. Told in May of 2012 his heart was only "35%" without history of MI. He gets short of breath especially at night-delays sleep onset. Some cough and wheeze only with colds. Some seasonal allergic rhinitis mainly spring and fall. Has home nebulizer with albuterol is only about once a month. Occasional choked drinking water without active reflux symptoms. He walks slowly and is not very active. Mild exertional dyspnea without much variation from one day to the next.  ROS-see HPI Constitutional:   No-   weight loss, night sweats, fevers, chills, fatigue, lassitude. HEENT:   No-  headaches, difficulty swallowing, tooth/dental problems, sore throat,       No-  sneezing, itching, ear ache, nasal congestion, post nasal drip,  CV:  No-   chest pain, orthopnea, PND, swelling in lower extremities, anasarca, dizziness, palpitations Resp: + shortness of breath with exertion or at rest.              No-   productive cough,  No non-productive cough,  No- coughing up of blood.              No-   change in color of mucus.  No- wheezing.   Skin: No-   rash or lesions. GI:  No-   heartburn, indigestion, abdominal pain, nausea, vomiting,  GU: No-   dysuria,  MS:  No-   joint pain or swelling.    Neuro-     nothing unusual Psych:  No- change in mood or affect. No depression or anxiety.  ? memory loss.  OBJ- Physical Exam General- Alert, Oriented, Affect-appropriate, Distress- none acute, slender Skin- rash-none, lesions- none,  excoriation- none Lymphadenopathy- none Head- atraumatic            Eyes- Gross vision intact, PERRLA, conjunctivae and secretions clear            Ears- Hearing, canals-normal            Nose- Clear, no-Septal dev, mucus, polyps, erosion, perforation             Throat- Mallampati III , mucosa clear , drainage- none, tonsils- atrophic, dentures Neck- flexible , trachea midline, no stridor , thyroid nl, carotid no bruit Chest - symmetrical excursion , unlabored           Heart/CV- RRR , no murmur , no gallop  , no rub, nl s1 s2                           - JVD- 1+ , edema-2+, stasis changes- none, varices- none           Lung- clear to P&A, wheeze- none, cough- mild , dullness-none, rub- none           Chest wall-  Abd- tender-no, distended-no, bowel sounds-present, HSM- no Br/ Gen/ Rectal- Not done, not indicated Extrem- cyanosis- none, clubbing, none, atrophy- none, strength- nl Neuro- droop right lower face, speech slurred  01/01/12- 82 yoM former smoker followed for COPD. Last  here 2008. Wife here. PCP Dr Algie Coffer. For Spiriva treat 1999 had shown restriction of exhaled volume with FEV1/FVC 0.73. Medical problems include cardiomyopathy, history of CVA, glaucoma, history of resection of prostate cancer. Smoked one half pack per day for 23 years until quitting in 1973. 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 History of pneumonia. Has had pneumonia vaccine at least twice, and flu vaccine. Told in May of 2012 his heart was only "35%" without history of MI. He gets short of breath especially at night-delays sleep onset. Some cough and wheeze only with colds. Some seasonal allergic rhinitis mainly spring and fall. Has home nebulizer with albuterol is only about once a month. Occasional choked drinking water without active reflux symptoms. He walks slowly and is not very active. Mild exertional dyspnea without much variation from one day to the next.  04/01/12- 82 yoM  former smoker followed for COPD.          Wife here. PCP Dr Algie Coffer.  c/o sob with exertion otherwise doing good His COPD assessment test (CAT) score is 20/40. He is now on oxygen at night 2 L/[facility]. Dyspnea on exertion. Much cough and. Easily choked when he swallows. Not aware of reflux. Cough is usually productive. He denies fever, pain, blood. He is not using his nebulizer machine at all and has run out of his rescue inhaler.  ROS-see HPI Constitutional:   No-   weight loss, night sweats, fevers, chills, fatigue, lassitude. HEENT:   No-  headaches, difficulty swallowing, tooth/dental problems, sore throat,       No-  sneezing, itching, ear ache, nasal congestion, post nasal drip,  CV:  No-   chest pain, orthopnea, PND, swelling in lower extremities, anasarca, dizziness, palpitations Resp: + shortness of breath with exertion or at rest.              No-   productive cough,  No non-productive cough,  No- coughing up of blood.              No-   change in color of mucus.  No- wheezing.   Skin: No-   rash or lesions. GI:  No-   heartburn, indigestion, abdominal pain, nausea, vomiting,  GU:   MS:  No-   joint pain or swelling.    Neuro-     nothing unusual Psych:  No- change in mood or affect. No depression or anxiety.  ? memory loss.  OBJ- Physical Exam General- Alert, Oriented, Affect-appropriate/ calm, Distress- none acute, slender Skin- rash-none, lesions- none, excoriation- none Lymphadenopathy- none Head- atraumatic            Eyes- Gross vision intact, PERRLA, conjunctivae and secretions clear            Ears- Hearing, canals-normal            Nose- Clear, no-Septal dev, mucus, polyps, erosion, perforation             Throat- Mallampati III , mucosa clear , drainage- none, tonsils- atrophic, dentures Neck- flexible , trachea midline, no stridor , thyroid nl, carotid no bruit Chest - symmetrical excursion , unlabored           Heart/CV- RRR , no murmur , no gallop  , no rub,  widely split S1 or loud S3.                           - JVD- 1+ ,  edema-1+, stasis changes- none, varices- none           Lung- clear to P&A, wheeze- none, cough- mild , dullness-none, rub- none           Chest wall-  Abd- tender-no, distended-no, bowel sounds-present, HSM- no Br/ Gen/ Rectal- Not done, not indicated Extrem- cyanosis- none, clubbing, none, atrophy- none, strength-probably not normal for age. Using a cane. Neuro- speech seems more clear than last visit and if anything it looks to me as if the left side of his mouth droops

## 2012-04-06 NOTE — Assessment & Plan Note (Signed)
His CAT score  today indicates moderate COPD symptoms. Plan-refill rescue inhaler and discussed use of nebulizer machine. Schedule PFT

## 2012-04-18 ENCOUNTER — Ambulatory Visit (INDEPENDENT_AMBULATORY_CARE_PROVIDER_SITE_OTHER): Payer: Medicare Other | Admitting: Internal Medicine

## 2012-04-18 DIAGNOSIS — J449 Chronic obstructive pulmonary disease, unspecified: Secondary | ICD-10-CM

## 2012-04-18 LAB — PULMONARY FUNCTION TEST

## 2012-04-18 NOTE — Progress Notes (Signed)
PFT done today. 

## 2012-08-25 IMAGING — CR DG CHEST 2V
1 series · 1 of 1 positions shown · non-contrast
Comparison: 12/14/2007

CLINICAL DATA: Shortness of breath.  Question CVA.

CHEST - 2 VIEW

[w chest lat]
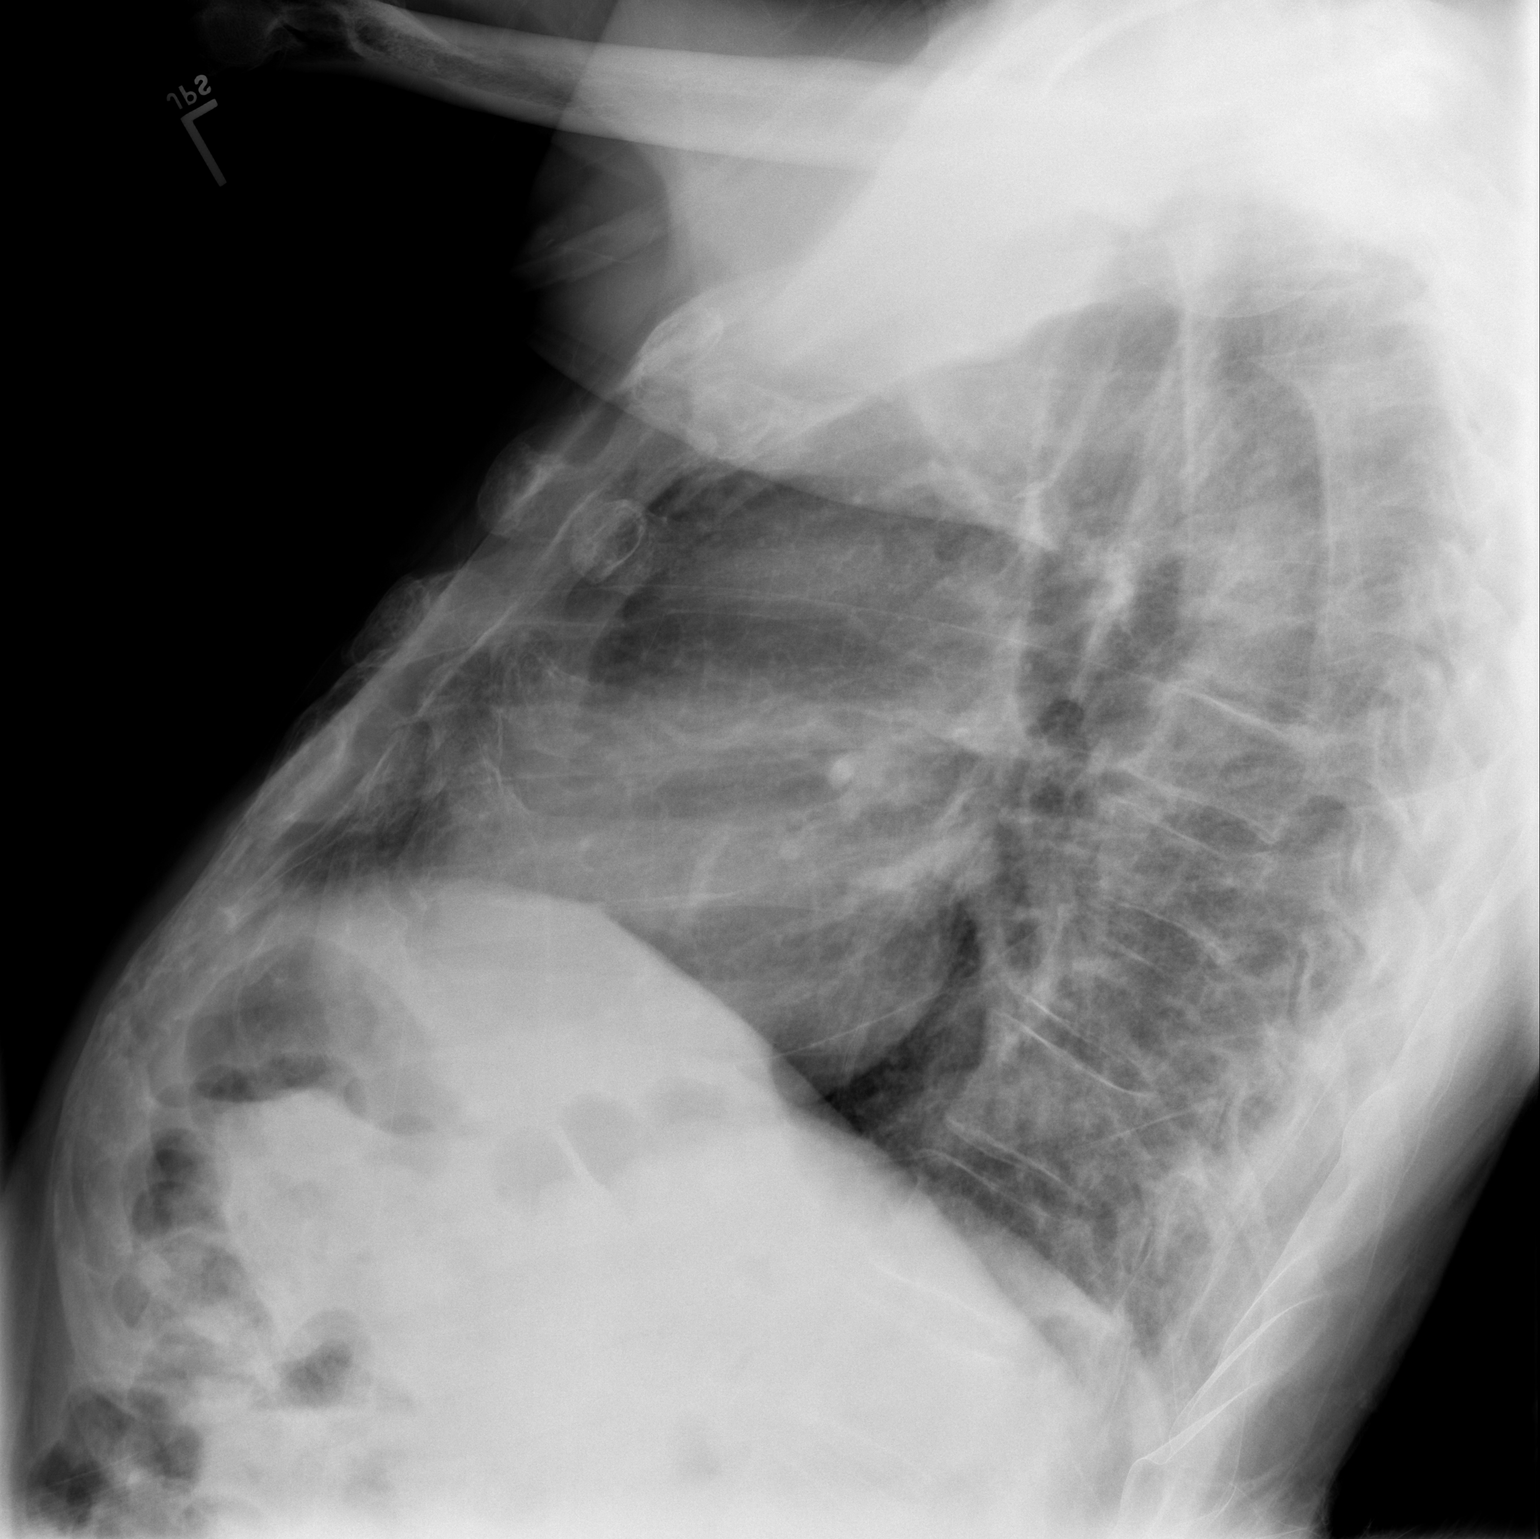

[1 of 1 positions shown; findings below may reference images not displayed]

FINDINGS: Mild hyperinflation.  Mildly degraded lateral view
secondary arm position.  Apical lordotic frontal view. Midline
trachea.  Mild cardiomegaly.  S-shaped thoracic spine curvature. No
pleural effusion or pneumothorax.  No congestive failure.

Mild interstitial thickening is lower lobe predominant. Clear
lungs.
IMPRESSION: Cardiomegaly and chronic interstitial thickening, No acute
findings.

## 2012-09-30 ENCOUNTER — Ambulatory Visit (INDEPENDENT_AMBULATORY_CARE_PROVIDER_SITE_OTHER): Payer: Medicare Other | Admitting: Internal Medicine

## 2012-09-30 ENCOUNTER — Encounter: Payer: Self-pay | Admitting: Internal Medicine

## 2012-09-30 ENCOUNTER — Ambulatory Visit (INDEPENDENT_AMBULATORY_CARE_PROVIDER_SITE_OTHER)
Admission: RE | Admit: 2012-09-30 | Discharge: 2012-09-30 | Disposition: A | Discharge status: Home / Self Care | Payer: Medicare Other | Source: Ambulatory Visit | Attending: Internal Medicine | Admitting: Internal Medicine

## 2012-09-30 VITALS — BP 120/72 | HR 86 | Ht 68.0 in | Wt 180.8 lb

## 2012-09-30 DIAGNOSIS — J449 Chronic obstructive pulmonary disease, unspecified: Secondary | ICD-10-CM

## 2012-09-30 NOTE — Progress Notes (Signed)
01/01/12- 82 yoM former smoker followed for COPD. Last here 2008. Wife here. PCP Dr Algie Coffer. Spirometry 1999 had shown restriction of exhaled volume with FEV1/FVC 0.73. Medical problems include cardiomyopathy, history of CVA, glaucoma, history of resection of prostate cancer. Smoked one half pack per day for 23 years until quitting in 1973. 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 History of pneumonia. Has had pneumonia vaccine at least twice, and flu vaccine. Told in May of 2012 his heart was only "35%" without history of MI. He gets short of breath especially at night-delays sleep onset. Some cough and wheeze only with colds. Some seasonal allergic rhinitis mainly spring and fall. Has home nebulizer with albuterol is only about once a month. Occasional choked drinking water without active reflux symptoms. He walks slowly and is not very active. Mild exertional dyspnea without much variation from one day to the next.  04/01/12- 82 yoM former smoker followed for COPD.          Wife here. PCP Dr Algie Coffer.  c/o sob with exertion otherwise doing good His COPD assessment test (CAT) score is 20/40. He is now on oxygen at night 2 L/[facility]. Dyspnea on exertion. Much cough and. Easily choked when he swallows. Not aware of reflux. Cough is usually productive. He denies fever, pain, blood. He is not using his nebulizer machine at all and has run out of his rescue inhaler.  09/30/12- 82 yoM former smoker followed for COPD, complicated by glaucoma, CM EF 35%          Wife here. PCP Dr Algie Coffer. FOLLOWS FOR: SOB with activity still. Some dyspnea on exertion but not bad or acute. Dry cough. Denies chest pain. Rarely needs his nebulizer. Continues to sleep with oxygen 2 L. CXR 01/09/12-reviewed IMPRESSION:  Cardiomegaly. Bronchitic changes. Right base atelectasis.  Original Report Authenticated By: Cyndie Chime, M.D.  PFT: 04/18/2012 mild obstructive airways disease with response to  bronchodilator, normal lung volumes, normal diffusion.  FEV1 1.86/76%, FEV1/FVC 0.60, FEF 2575% 0.78/37%. TLC 95%, DLCO 94%.  ROS-see HPI Constitutional:   No-   weight loss, night sweats, fevers, chills, fatigue, lassitude. HEENT:   No-  headaches, difficulty swallowing, tooth/dental problems, sore throat,       No-  sneezing, itching, ear ache, nasal congestion, post nasal drip,  CV:  No-   chest pain, orthopnea, PND, swelling in lower extremities, anasarca, dizziness, palpitations Resp: + shortness of breath with exertion or at rest.              No-   productive cough,  + non-productive cough,  No- coughing up of blood.              No-   change in color of mucus.  No- wheezing.   Skin: No-   rash or lesions. GI:  No-   heartburn, indigestion, abdominal pain, nausea, vomiting,  GU:   MS:  No-   joint pain or swelling.    Neuro-     nothing unusual Psych:  No- change in mood or affect. No depression or anxiety.  ? memory loss.  OBJ- Physical Exam General- Alert, Oriented, Affect-appropriate/ calm, Distress- none acute, slender Skin- rash-none, lesions- none, excoriation- none Lymphadenopathy- none Head- atraumatic            Eyes- Gross vision intact, PERRLA, conjunctivae and secretions clear            Ears- Hearing, canals-normal  Nose- Clear, no-Septal dev, mucus, polyps, erosion, perforation             Throat- Mallampati III , mucosa clear , drainage- none, tonsils- atrophic, dentures Neck- flexible , trachea midline, no stridor , thyroid nl, carotid no bruit Chest - symmetrical excursion , unlabored           Heart/CV- RRR , no murmur , +split S2  , no rub, widely split S1 or loud S3.                           - JVD- 1+ , edema-1+, stasis changes- none, varices- none           Lung- clear to P&A, wheeze- none, cough- mild , dullness-none, rub- none           Chest wall-  Abd- Br/ Gen/ Rectal- Not done, not indicated Extrem- cyanosis- none, clubbing, none,  atrophy- none, strength-probably not normal for age. Using a cane. Neuro- speech seems more clear than last visit and if anything it looks to me as if the left side of his mouth droops

## 2012-09-30 NOTE — Patient Instructions (Addendum)
Order- CXR    Dx COPD  Please call as needed 

## 2012-10-07 NOTE — Assessment & Plan Note (Signed)
Mild COPD with a small reversible component and no acute events. Medications are appropriate. Plan-update chest x-ray

## 2012-11-13 ENCOUNTER — Telehealth: Payer: Self-pay | Admitting: Internal Medicine

## 2012-11-13 NOTE — Telephone Encounter (Signed)
Called spoke with patient's daughter [name] @ [phone] who stated that her mother/pt's spouse was confused regarding cxr results from 12.9.13 because she understood that "they found something on his lung."  Apologized to [facility] for the misunderstanding and reported that there is "slight progression...but otherwise nothing new or active."  Pearl okay with these results and denied any further questions/concerns.  Result Notes     Notes Recorded by Caryl Ada, CMA on 10/22/2012 at 1:55 PM Pt aware of results ------  Notes Recorded by Waymon Budge, MD on 09/30/2012 at 1:36 PM CXR- slight progression of fibrosis/ scarring, but otherwise nothing new or active.

## 2013-03-31 ENCOUNTER — Ambulatory Visit (INDEPENDENT_AMBULATORY_CARE_PROVIDER_SITE_OTHER): Payer: Medicare Other | Admitting: Internal Medicine

## 2013-03-31 ENCOUNTER — Encounter: Payer: Self-pay | Admitting: Internal Medicine

## 2013-03-31 VITALS — BP 120/70 | HR 89 | Ht 68.0 in | Wt 183.0 lb

## 2013-03-31 DIAGNOSIS — I2589 Other forms of chronic ischemic heart disease: Secondary | ICD-10-CM

## 2013-03-31 DIAGNOSIS — J449 Chronic obstructive pulmonary disease, unspecified: Secondary | ICD-10-CM

## 2013-03-31 DIAGNOSIS — I255 Ischemic cardiomyopathy: Secondary | ICD-10-CM

## 2013-03-31 NOTE — Progress Notes (Signed)
01/01/12- 82 yoM former smoker followed for COPD. Last here 2008. Wife here. PCP Dr Algie Coffer. Spirometry 1999 had shown restriction of exhaled volume with FEV1/FVC 0.73. Medical problems include cardiomyopathy, history of CVA, glaucoma, history of resection of prostate cancer. Smoked one half pack per day for 23 years until quitting in 1973. 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 History of pneumonia. Has had pneumonia vaccine at least twice, and flu vaccine. Told in May of 2012 his heart was only "35%" without history of MI. He gets short of breath especially at night-delays sleep onset. Some cough and wheeze only with colds. Some seasonal allergic rhinitis mainly spring and fall. Has home nebulizer with albuterol is only about once a month. Occasional choked drinking water without active reflux symptoms. He walks slowly and is not very active. Mild exertional dyspnea without much variation from one day to the next.  04/01/12- 82 yoM former smoker followed for COPD.          Wife here. PCP Dr Algie Coffer.  c/o sob with exertion otherwise doing good His COPD assessment test (CAT) score is 20/40. He is now on oxygen at night 2 L/[facility]. Dyspnea on exertion. Much cough and. Easily choked when he swallows. Not aware of reflux. Cough is usually productive. He denies fever, pain, blood. He is not using his nebulizer machine at all and has run out of his rescue inhaler.  09/30/12- 82 yoM former smoker followed for COPD, complicated by glaucoma, CM EF 35%          Wife here. PCP Dr Algie Coffer. FOLLOWS FOR: SOB with activity still. Some dyspnea on exertion but not bad or acute. Dry cough. Denies chest pain. Rarely needs his nebulizer. Continues to sleep with oxygen 2 L. CXR 01/09/12-reviewed IMPRESSION:  Cardiomegaly. Bronchitic changes. Right base atelectasis.  Original Report Authenticated By: Cyndie Chime, M.D.  PFT: 04/18/2012 mild- moderate obstructive airways disease with  response to bronchodilator, normal lung volumes, normal diffusion.  FEV1 1.86/76%, FEV1/FVC 0.60, FEF 25-75% 0.78/37%. TLC 95%, DLCO 94%.  03/31/13- 82 yoM former smoker followed for COPD, fibrosis, complicated by glaucoma, CM EF 35%    PCP, Hx CVA   Dr Algie Coffer. Wears oxygen 2 L "sometimes" for sleep. Rarely uses rescue inhaler or nebulizer. Notices cough mostly only if swallowing and denies wheeze. CXR 10/22/12 IMPRESSION:  No acute abnormality. Slight progression of interstitial lung  disease.  Original Report Authenticated By: Francene Boyers, M.D.  ROS-see HPI Constitutional:   No-   weight loss, night sweats, fevers, chills, fatigue, lassitude. HEENT:   No-  headaches, difficulty swallowing, tooth/dental problems, sore throat,       No-  sneezing, itching, ear ache, nasal congestion, post nasal drip,  CV:  No-   chest pain, orthopnea, PND, swelling in lower extremities, anasarca, dizziness, palpitations Resp: + shortness of breath with exertion or at rest.              No-   productive cough, no- non-productive cough,  No- coughing up of blood.              No-   change in color of mucus.  No- wheezing.   Skin: No-   rash or lesions. GI:  No-   heartburn, indigestion, abdominal pain, nausea, vomiting,  GU:   MS:  No-   joint pain or swelling.    Neuro-     nothing unusual Psych:  No- change in mood or affect. No  depression or anxiety.  ? memory loss.  OBJ- Physical Exam General- Alert, Oriented, Affect-appropriate/ calm, Distress- none acute, slender Skin- rash-none, lesions- none, excoriation- none Lymphadenopathy- none Head- atraumatic            Eyes- Gross vision intact, PERRLA, conjunctivae and secretions clear            Ears- Hearing, canals-normal            Nose- Clear, no-Septal dev, mucus, polyps, erosion, perforation             Throat- Mallampati III , mucosa clear , drainage- none, tonsils- atrophic, dentures Neck- flexible , trachea midline, no stridor , thyroid  nl, carotid no bruit Chest - symmetrical excursion , unlabored           Heart/CV- RRR , no murmur , +split S2  , no rub, widely split S1 or loud S3.                           - JVD- 1+ , edema-2+, stasis changes- none, varices- none           Lung- clear to P&A, wheeze- none, cough- mild , dullness-none, rub- none           Chest wall-  Abd- Br/ Gen/ Rectal- Not done, not indicated Extrem- cyanosis- none, clubbing, none, atrophy- none, strength-probably not normal for age. Using a cane. Neuro- slow speech, question droop right-sided mouth

## 2013-03-31 NOTE — Patient Instructions (Addendum)
Order- DME [facility] ONOX on room air,   Dx COPD  Please call as needed

## 2013-04-13 DIAGNOSIS — I255 Ischemic cardiomyopathy: Secondary | ICD-10-CM | POA: Insufficient documentation

## 2013-04-13 NOTE — Assessment & Plan Note (Signed)
Is probably the basis for peripheral edema. They are seeing Dr.Kadakia soon for f/u

## 2013-04-13 NOTE — Assessment & Plan Note (Addendum)
Not feeling need for oxygen frequently. We discussed this in the context of cardiomyopathy and history of stroke I'm not concerned about mild fibrosis seen on chest x-ray at this point. Also watching for interstitial edema. Plan-update ONOX on room air

## 2013-06-03 ENCOUNTER — Ambulatory Visit: Payer: Medicare Other | Admitting: Internal Medicine

## 2013-09-08 ENCOUNTER — Other Ambulatory Visit: Payer: Self-pay | Admitting: Gastroenterology

## 2013-09-15 ENCOUNTER — Ambulatory Visit
Admission: RE | Admit: 2013-09-15 | Discharge: 2013-09-15 | Disposition: A | Discharge status: Home / Self Care | Payer: Medicare Other | Source: Ambulatory Visit | Attending: Gastroenterology | Admitting: Gastroenterology

## 2013-09-19 ENCOUNTER — Ambulatory Visit: Payer: Medicare Other | Admitting: Internal Medicine

## 2013-10-28 ENCOUNTER — Ambulatory Visit (INDEPENDENT_AMBULATORY_CARE_PROVIDER_SITE_OTHER): Payer: Medicare Other | Admitting: Internal Medicine

## 2013-10-28 ENCOUNTER — Encounter: Payer: Self-pay | Admitting: Internal Medicine

## 2013-10-28 ENCOUNTER — Ambulatory Visit (INDEPENDENT_AMBULATORY_CARE_PROVIDER_SITE_OTHER)
Admission: RE | Admit: 2013-10-28 | Discharge: 2013-10-28 | Disposition: A | Discharge status: Home / Self Care | Payer: Medicare Other | Source: Ambulatory Visit | Attending: Internal Medicine | Admitting: Internal Medicine

## 2013-10-28 VITALS — BP 120/78 | HR 89 | Ht 68.0 in | Wt 174.6 lb

## 2013-10-28 DIAGNOSIS — J441 Chronic obstructive pulmonary disease with (acute) exacerbation: Secondary | ICD-10-CM

## 2013-10-28 DIAGNOSIS — J439 Emphysema, unspecified: Secondary | ICD-10-CM

## 2013-10-28 DIAGNOSIS — J438 Other emphysema: Secondary | ICD-10-CM

## 2013-10-28 MED ORDER — ALBUTEROL SULFATE (2.5 MG/3ML) 0.083% IN NEBU: 2.5000 mg | INHALATION_SOLUTION | Freq: Four times a day (QID) | RESPIRATORY_TRACT | Status: DC | PRN

## 2013-10-28 NOTE — Patient Instructions (Addendum)
Order- DME [facility]- albuterol nebulizer solution     Dx COPD     He has machine   Order- CXr  Dx COPD, pulmonary fibrosis   Please call as needed  Try to walk some for strength and endurance.

## 2013-10-28 NOTE — Progress Notes (Signed)
01/01/12- 89 yoM former smoker followed for COPD. Last here 2008. Wife here. PCP Dr Doylene Canard. Spirometry 1999 had shown restriction of exhaled volume with FEV1/FVC 0.73. Medical problems include cardiomyopathy, history of CVA, glaucoma, history of resection of prostate cancer. Smoked one half pack per day for 23 years until quitting in 1973. Married and living with his wife, retired from a [employer]. History of pneumonia. Has had pneumonia vaccine at least twice, and flu vaccine. Told in May of 2012 his heart was only "35%" without history of MI. He gets short of breath especially at night-delays sleep onset. Some cough and wheeze only with colds. Some seasonal allergic rhinitis mainly spring and fall. Has home nebulizer with albuterol is only about once a month. Occasional choked drinking water without active reflux symptoms. He walks slowly and is not very active. Mild exertional dyspnea without much variation from one day to the next.  04/01/12- 17 yoM former smoker followed for COPD.          Wife here. PCP Dr Doylene Canard.  c/o sob with exertion otherwise doing good His COPD assessment test (CAT) score is 20/40. He is now on oxygen at night 2 L/[facility]. Dyspnea on exertion. Much cough and. Easily choked when he swallows. Not aware of reflux. Cough is usually productive. He denies fever, pain, blood. He is not using his nebulizer machine at all and has run out of his rescue inhaler.  09/30/12- 38 yoM former smoker followed for COPD, complicated by glaucoma, CM EF 35%          Wife here. PCP Dr Doylene Canard. FOLLOWS FOR: SOB with activity still. Some dyspnea on exertion but not bad or acute. Dry cough. Denies chest pain. Rarely needs his nebulizer. Continues to sleep with oxygen 2 L. CXR 01/09/12-reviewed IMPRESSION:  Cardiomegaly. Bronchitic changes. Right base atelectasis.  Original Report Authenticated By: Raelyn Number, M.D.  PFT: 04/18/2012 mild- moderate obstructive airways disease with  response to bronchodilator, normal lung volumes, normal diffusion.  FEV1 1.86/76%, FEV1/FVC 0.60, FEF 25-75% 0.78/37%. TLC 95%, DLCO 94%.  03/31/13- 68 yoM former smoker followed for COPD, fibrosis, complicated by glaucoma, CM EF 35%    PCP, Hx CVA   Dr Doylene Canard. Wears oxygen 2 L "sometimes" for sleep. Rarely uses rescue inhaler or nebulizer. Notices cough mostly only if swallowing and denies wheeze. CXR 10/22/12 IMPRESSION:  No acute abnormality. Slight progression of interstitial lung  disease.  Original Report Authenticated By: Lorriane Shire, M.D.  10/28/13-  67 yoM former smoker followed for COPD, fibrosis, complicated by glaucoma, CM EF 35%,     Hx CVA    PCP Dr Joneen Caraway.  Daughter here FOLLOWS FOR:  Breathing is unchanged.  c/o: cough x2 weeks non-productive Little cough now. Oxygen 2 L/[facility] is used if needed. Denies chest pain or blood.  ROS-see HPI Constitutional:   No-   weight loss, night sweats, fevers, chills, fatigue, lassitude. HEENT:   No-  headaches, difficulty swallowing, tooth/dental problems, sore throat,       No-  sneezing, itching, ear ache, nasal congestion, post nasal drip,  CV:  No-   chest pain, orthopnea, PND, swelling in lower extremities, anasarca, dizziness, palpitations Resp: + shortness of breath with exertion or at rest.              No-   productive cough, no- non-productive cough,  No- coughing up of blood.              No-  change in color of mucus.  No- wheezing.   Skin: No-   rash or lesions. GI:  No-   heartburn, indigestion, abdominal pain, nausea, vomiting,  GU:   MS:  No-   joint pain or swelling.    Neuro-     + falling without syncope Psych:  No- change in mood or affect. No depression or anxiety.  ? memory loss.  OBJ- Physical Exam   Room air O2 saturation 98% at rest General- Alert, Oriented, Affect-appropriate/ calm, Distress- none acute, slender, elderly woman Skin- rash-none, lesions- none, excoriation- none Lymphadenopathy-  none Head- atraumatic            Eyes- Gross vision intact, PERRLA, conjunctivae and secretions clear            Ears- Hearing, canals-normal            Nose- Clear, no-Septal dev, mucus, polyps, erosion, perforation             Throat- Mallampati III , mucosa clear , drainage- none, tonsils- atrophic, dentures Neck- flexible , trachea midline, no stridor , thyroid nl, carotid no bruit Chest - symmetrical excursion , unlabored           Heart/CV- RRR , no murmur , +split S2  , no rub, widely split S1 or loud S3.                           - JVD- 1+ , edema-2+, stasis changes- none, varices- none           Lung- + faint basilar crackles, wheeze- none, cough- mild , dullness-none, rub- none           Chest wall-  Abd- Br/ Gen/ Rectal- Not done, not indicated Extrem- cyanosis- none, clubbing, none, atrophy- none, strength-probably not normal for age.    Using a cane. Neuro- slow speech, question droop right-sided mouth

## 2013-11-21 NOTE — Assessment & Plan Note (Signed)
COPD with a fibrotic component Plan-chest x-ray, refill prescription for nebulizer solution

## 2013-12-01 ENCOUNTER — Telehealth: Payer: Self-pay | Admitting: Internal Medicine

## 2013-12-01 NOTE — Telephone Encounter (Signed)
lmtcb x2 

## 2013-12-01 NOTE — Telephone Encounter (Signed)
Need rest and exercise O2 sat to document qualification for hme O2 for dx COPD, since he wants to change DME companies.

## 2013-12-01 NOTE — Telephone Encounter (Signed)
PLEASE CALL BACK

## 2013-12-01 NOTE — Telephone Encounter (Signed)
lmtcb x1 

## 2013-12-01 NOTE — Telephone Encounter (Signed)
Advised pt's daughter that with switching DME pt will have to have qualifying sats. She agreed and verbalized understanding.  CY - please advise on DME change. Thanks.

## 2013-12-02 NOTE — Telephone Encounter (Signed)
appt set for Thursday at [place], [place]

## 2013-12-04 ENCOUNTER — Ambulatory Visit (INDEPENDENT_AMBULATORY_CARE_PROVIDER_SITE_OTHER): Payer: Medicare Other | Admitting: Internal Medicine

## 2013-12-04 DIAGNOSIS — J438 Other emphysema: Secondary | ICD-10-CM

## 2013-12-04 NOTE — Progress Notes (Signed)
Pt doesn't qualify for exertion oxygen. He does use oxygen at night. ONO will need to be done to see if he needs oxygen at night. Order will be placed. CY is aware of what will be done.

## 2014-01-07 ENCOUNTER — Encounter: Payer: Self-pay | Admitting: Internal Medicine

## 2014-01-15 ENCOUNTER — Telehealth: Payer: Self-pay | Admitting: Internal Medicine

## 2014-01-15 DIAGNOSIS — J439 Emphysema, unspecified: Secondary | ICD-10-CM

## 2014-01-15 NOTE — Telephone Encounter (Signed)
Spoke with CY-states per ONO patient does not need O2 anymore and order placed to [facility]. Daughter is aware of order.

## 2014-01-15 NOTE — Telephone Encounter (Signed)
Daughter called back.  Wants to make sure you know [facility] will not pick up the tanks and pt is being billed until we let them know he no longer needs it.

## 2014-01-20 ENCOUNTER — Telehealth: Payer: Self-pay | Admitting: Internal Medicine

## 2014-01-20 NOTE — Telephone Encounter (Signed)
Spoke with [employer]. Pt wanted to switch to [facility] from [facility] for his O2. Made her aware per phone note 01/15/14 pt had ONO and did not need O2 at bedtime any longer. She voiced her understanding and needed nothing further

## 2014-01-20 NOTE — Telephone Encounter (Signed)
Ok to dc O2 

## 2014-02-11 ENCOUNTER — Emergency Department (INDEPENDENT_AMBULATORY_CARE_PROVIDER_SITE_OTHER): Payer: Medicare Other

## 2014-02-11 ENCOUNTER — Encounter (HOSPITAL_COMMUNITY): Payer: Self-pay | Admitting: Emergency Medicine

## 2014-02-11 ENCOUNTER — Emergency Department (HOSPITAL_COMMUNITY)
Admission: EM | Admit: 2014-02-11 | Discharge: 2014-02-11 | Disposition: A | Discharge status: Home / Self Care | Payer: Medicare Other | Source: Home / Self Care | Attending: Emergency Medicine | Admitting: Emergency Medicine

## 2014-02-11 DIAGNOSIS — L03012 Cellulitis of left finger: Secondary | ICD-10-CM

## 2014-02-11 DIAGNOSIS — Z23 Encounter for immunization: Secondary | ICD-10-CM

## 2014-02-11 DIAGNOSIS — L03019 Cellulitis of unspecified finger: Secondary | ICD-10-CM

## 2014-02-11 DIAGNOSIS — R739 Hyperglycemia, unspecified: Secondary | ICD-10-CM

## 2014-02-11 LAB — GLUCOSE, CAPILLARY: GLUCOSE-CAPILLARY: 247 mg/dL — AB (ref 70–99)

## 2014-02-11 MED ORDER — TETANUS-DIPHTH-ACELL PERTUSSIS 5-2.5-18.5 LF-MCG/0.5 IM SUSP
0.5000 mL | Freq: Once | INTRAMUSCULAR | Status: AC
  Administered 2014-02-11: 0.5 mL via INTRAMUSCULAR

## 2014-02-11 MED ORDER — CEPHALEXIN 500 MG PO CAPS: 500.0000 mg | ORAL_CAPSULE | Freq: Four times a day (QID) | ORAL | Status: DC

## 2014-02-11 MED ORDER — TETANUS-DIPHTH-ACELL PERTUSSIS 5-2.5-18.5 LF-MCG/0.5 IM SUSP
INTRAMUSCULAR | Status: AC
  Filled 2014-02-11: qty 0.5

## 2014-02-11 NOTE — ED Provider Notes (Signed)
CSN: [id number]     Arrival date & time 02/11/14  0900 History   First MD Initiated Contact with Patient 02/11/14 0920     Chief Complaint  Patient presents with  . Hand Pain   (Consider location/radiation/quality/duration/timing/severity/associated sxs/prior Treatment) HPI Comments: Increased pain, swelling, redness and purulent drainage from left thumb in diabetic 80 y/o male. Is without known injury. History provided primarily by daughter as patient is s/p remote CVA and has minimal verbal communication skills. PCP: Dr. Joneen Caraway at [facility] (next appointment is 02-16-2014)  Patient is a 80 y.o. male presenting with hand pain. The history is provided by the patient and a relative.  Hand Pain This is a new problem. Episode onset: 4 days ago. The problem occurs constantly. The problem has been rapidly worsening.    Past Medical History  Diagnosis Date  . Asthma   . Stroke   . Prostate cancer    Past Surgical History  Procedure Laterality Date  . Prostate surgery     Family History  Problem Relation Age of Onset  . Diabetes Maternal Aunt    History  Substance Use Topics  . Smoking status: Former Smoker    Types: Cigarettes    Quit date: 10/23/1957  . Smokeless tobacco: Not on file     Comment: Smoked 1/2 ppd x 23 years  . Alcohol Use: No    Review of Systems  Unable to perform ROS: Patient nonverbal    Allergies  Review of patient's allergies indicates no known allergies.  Home Medications   Prior to Admission medications   Medication Sig Start Date End Date Taking? Authorizing Provider  albuterol (PROVENTIL HFA;VENTOLIN HFA) 108 (90 BASE) MCG/ACT inhaler Inhale 2 puffs into the lungs every 6 (six) hours as needed for wheezing or shortness of breath. 04/01/12   Deneise Lever, MD  albuterol (PROVENTIL) (2.5 MG/3ML) 0.083% nebulizer solution Take 3 mLs (2.5 mg total) by nebulization every 6 (six) hours as needed for wheezing or shortness of breath. 10/28/13    Deneise Lever, MD  aspirin 81 MG chewable tablet Chew 81 mg by mouth daily.    Historical Provider, MD  carvedilol (COREG) 3.125 MG tablet Take 3.125 mg by mouth daily.  12/26/11   Historical Provider, MD  clopidogrel (PLAVIX) 75 MG tablet Take 75 mg by mouth daily. 12/26/11   Historical Provider, MD  CRESTOR 20 MG tablet 20 mg daily. 12/04/11   Historical Provider, MD  fesoterodine (TOVIAZ) 4 MG TB24 Take 4 mg by mouth daily.    Historical Provider, MD  furosemide (LASIX) 40 MG tablet Take 1 tablet by mouth daily. 01/25/13   Historical Provider, MD  glimepiride (AMARYL) 2 MG tablet Take 1 tablet by mouth daily. 4mg  daily 09/08/12   Historical Provider, MD  Multiple Vitamins-Minerals (CENTRUM SILVER ULTRA MENS) TABS Take by mouth daily.    Historical Provider, MD  polyethylene glycol (MIRALAX / GLYCOLAX) packet Take 17 g by mouth daily as needed.    Historical Provider, MD  ramipril (ALTACE) 2.5 MG capsule Take 1 tablet by mouth daily. 09/08/12   Historical Provider, MD   BP 162/83  Pulse 76  Temp(Src) 97.8 F (36.6 C) (Oral)  Resp 16  SpO2 100% Physical Exam  Nursing note reviewed. Constitutional: He appears well-developed and well-nourished. No distress.  HENT:  Head: Normocephalic and atraumatic.  Eyes: Conjunctivae are normal. No scleral icterus.  Musculoskeletal:       Hands: 3 x 2 cm area of  abscess draining thick yellow purulent material with moderate STS, erythema and induration of left thumb. Decreased ROM (flexion) as a result of both pain and swelling). Cap refill intact.  Neurological: He is alert.  Skin: Skin is warm and dry.  Psychiatric: He has a normal mood and affect. His behavior is normal.    ED Course  INCISION AND DRAINAGE Date/Time: 02/11/2014 11:07 AM Performed by: Griselda Miner LEE Authorized by: Philipp Deputy C Consent: Verbal consent obtained. Risks and benefits: risks, benefits and alternatives were discussed Consent given by: patient Patient  understanding: patient states understanding of the procedure being performed Patient identity confirmed: verbally with patient and arm band Time out: Immediately prior to procedure a "time out" was called to verify the correct patient, procedure, equipment, support staff and site/side marked as required. Type: abscess Body area: upper extremity Location details: left thumb Local anesthetic: topical anesthetic Patient sedated: no Scalpel size: 11 Incision type: single straight Complexity: simple Drainage: purulent Drainage amount: moderate Wound treatment: wound left open Packing material: none Patient tolerance: Patient tolerated the procedure well with no immediate complications.   (including critical care time) Labs Review Labs Reviewed  GLUCOSE, CAPILLARY - Abnormal; Notable for the following:    Glucose-Capillary 247 (*)    All other components within normal limits    Results for orders placed during the hospital encounter of 02/11/14  GLUCOSE, CAPILLARY      Result Value Ref Range   Glucose-Capillary 247 (*) 70 - 99 mg/dL   Comment 1 Notify RN     Comment 2 Documented in Chart     Imaging Review Dg Finger Thumb Left  02/11/2014   CLINICAL DATA:  Swelling and infection left thumb, oozing at wound  EXAM: LEFT THUMB 2+V  COMPARISON:  None  FINDINGS: Soft tissue swelling left thumb.  Minimal degenerative changes of IP joint.  Osseous mineralization grossly normal.  No acute fracture, dislocation or bone destruction.  No soft tissue gas or radiopaque foreign bodies.  Tiny benign-appearing cortical excrescence from the proximal phalanx left thumb.  IMPRESSION: Soft tissue swelling without acute osseous findings.   Electronically Signed   By: Lavonia Dana M.D.   On: 02/11/2014 10:34     MDM   1. Paronychia of left thumb   2. Hyperglycemia    Left Thumb Paronychia: no clinical evidence of flexor sheath tenosynovitis or osteomyelitis. Abscess drained as about. Advised warm Epsom  salt soaks at home BID-TID, cephalexin as prescribed and  Follow up in 2 days for wound re-check. Family made aware of patient's hyperglycemia and advise to contact his PCP.   [place], [place] 02/11/14 1110

## 2014-02-11 NOTE — ED Notes (Signed)
Pt  Has  Pain  And  Swelling  Base of l thumbnail  With    Pus  Present   As well  As pain and  Swelling       Symptoms  X  2  Days        Pt  denys  Any  specefic  Injury

## 2014-02-11 NOTE — Discharge Instructions (Signed)
Your blood sugar is running a bit high. Please follow up with your doctor to determine if your medication needs to be adjusted. For the left thumb, please perform warm Epsom salt soaks 2-3 times a day until healed. If possible, please follow up here in two days to make sure that the condition of your left thumb is improving.   Hyperglycemia Hyperglycemia occurs when the glucose (sugar) in your blood is too high. Hyperglycemia can happen for many reasons, but it most often happens to people who do not know they have diabetes or are not managing their diabetes properly.  CAUSES  Whether you have diabetes or not, there are other causes of hyperglycemia. Hyperglycemia can occur when you have diabetes, but it can also occur in other situations that you might not be as aware of, such as: Diabetes  If you have diabetes and are having problems controlling your blood glucose, hyperglycemia could occur because of some of the following reasons:  Not following your meal plan.  Not taking your diabetes medications or not taking it properly.  Exercising less or doing less activity than you normally do.  Being sick. Pre-diabetes  This cannot be ignored. Before people develop Type 2 diabetes, they almost always have "pre-diabetes." This is when your blood glucose levels are higher than normal, but not yet high enough to be diagnosed as diabetes. Research has shown that some long-term damage to the body, especially the heart and circulatory system, may already be occurring during pre-diabetes. If you take action to manage your blood glucose when you have pre-diabetes, you may delay or prevent Type 2 diabetes from developing. Stress  If you have diabetes, you may be "diet" controlled or on oral medications or insulin to control your diabetes. However, you may find that your blood glucose is higher than usual in the hospital whether you have diabetes or not. This is often referred to as "stress hyperglycemia."  Stress can elevate your blood glucose. This happens because of hormones put out by the body during times of stress. If stress has been the cause of your high blood glucose, it can be followed regularly by your caregiver. That way he/she can make sure your hyperglycemia does not continue to get worse or progress to diabetes. Steroids  Steroids are medications that act on the infection fighting system (immune system) to block inflammation or infection. One side effect can be a rise in blood glucose. Most people can produce enough extra insulin to allow for this rise, but for those who cannot, steroids make blood glucose levels go even higher. It is not unusual for steroid treatments to "uncover" diabetes that is developing. It is not always possible to determine if the hyperglycemia will go away after the steroids are stopped. A special blood test called an A1c is sometimes done to determine if your blood glucose was elevated before the steroids were started. SYMPTOMS  Thirsty.  Frequent urination.  Dry mouth.  Blurred vision.  Tired or fatigue.  Weakness.  Sleepy.  Tingling in feet or leg. DIAGNOSIS  Diagnosis is made by monitoring blood glucose in one or all of the following ways:  A1c test. This is a chemical found in your blood.  Fingerstick blood glucose monitoring.  Laboratory results. TREATMENT  First, knowing the cause of the hyperglycemia is important before the hyperglycemia can be treated. Treatment may include, but is not be limited to:  Education.  Change or adjustment in medications.  Change or adjustment in meal plan.  Treatment for an illness, infection, etc.  More frequent blood glucose monitoring.  Change in exercise plan.  Decreasing or stopping steroids.  Lifestyle changes. HOME CARE INSTRUCTIONS   Test your blood glucose as directed.  Exercise regularly. Your caregiver will give you instructions about exercise. Pre-diabetes or diabetes which comes  on with stress is helped by exercising.  Eat wholesome, balanced meals. Eat often and at regular, fixed times. Your caregiver or nutritionist will give you a meal plan to guide your sugar intake.  Being at an ideal weight is important. If needed, losing as little as 10 to 15 pounds may help improve blood glucose levels. SEEK MEDICAL CARE IF:   You have questions about medicine, activity, or diet.  You continue to have symptoms (problems such as increased thirst, urination, or weight gain). SEEK IMMEDIATE MEDICAL CARE IF:   You are vomiting or have diarrhea.  Your breath smells fruity.  You are breathing faster or slower.  You are very sleepy or incoherent.  You have numbness, tingling, or pain in your feet or hands.  You have chest pain.  Your symptoms get worse even though you have been following your caregiver's orders.  If you have any other questions or concerns. Document Released: 04/04/2001 Document Revised: 01/01/2012 Document Reviewed: 02/05/2012 [facility] Patient Information 2014 [place], [place].  Paronychia Paronychia is an inflammatory reaction involving the folds of the skin surrounding the fingernail. This is commonly caused by an infection in the skin around a nail. The most common cause of paronychia is frequent wetting of the hands (as seen with bartenders, food servers, nurses or others who wet their hands). This makes the skin around the fingernail susceptible to infection by bacteria (germs) or fungus. Other predisposing factors are:  Aggressive manicuring.  Nail biting.  Thumb sucking. The most common cause is a staphylococcal (a type of germ) infection, or a fungal (Candida) infection. When caused by a germ, it usually comes on suddenly with redness, swelling, pus and is often painful. It may get under the nail and form an abscess (collection of pus), or form an abscess around the nail. If the nail itself is infected with a fungus, the treatment is usually  prolonged and may require oral medicine for up to one year. Your caregiver will determine the length of time treatment is required. The paronychia caused by bacteria (germs) may largely be avoided by not pulling on hangnails or picking at cuticles. When the infection occurs at the tips of the finger it is called felon. When the cause of paronychia is from the herpes simplex virus (HSV) it is called herpetic whitlow. TREATMENT  When an abscess is present treatment is often incision and drainage. This means that the abscess must be cut open so the pus can get out. When this is done, the following home care instructions should be followed. HOME CARE INSTRUCTIONS   It is important to keep the affected fingers very dry. Rubber or plastic gloves over cotton gloves should be used whenever the hand must be placed in water.  Keep wound clean, dry and dressed as suggested by your caregiver between warm soaks or warm compresses.  Soak in warm water for fifteen to twenty minutes three to four times per day for bacterial infections. Fungal infections are very difficult to treat, so often require treatment for long periods of time.  For bacterial (germ) infections take antibiotics (medicine which kill germs) as directed and finish the prescription, even if the problem appears to be  solved before the medicine is gone.  Only take over-the-counter or prescription medicines for pain, discomfort, or fever as directed by your caregiver. SEEK IMMEDIATE MEDICAL CARE IF:  You have redness, swelling, or increasing pain in the wound.  You notice pus coming from the wound.  You have a fever.  You notice a bad smell coming from the wound or dressing. Document Released: 04/04/2001 Document Revised: 01/01/2012 Document Reviewed: 12/04/2008 [facility] Patient Information 2014 [place].

## 2014-02-13 NOTE — ED Provider Notes (Signed)
Medical screening examination/treatment/procedure(s) were performed by non-physician practitioner and as supervising physician I was immediately available for consultation/collaboration.  Philipp Deputy, M.D.  Harden Mo, MD 02/13/14 [phone]

## 2014-03-26 ENCOUNTER — Encounter (HOSPITAL_COMMUNITY): Payer: Self-pay | Admitting: Emergency Medicine

## 2014-03-26 ENCOUNTER — Emergency Department (HOSPITAL_COMMUNITY)
Admission: EM | Admit: 2014-03-26 | Discharge: 2014-03-26 | Disposition: A | Discharge status: Home / Self Care | Payer: Medicare Other | Attending: Emergency Medicine | Admitting: Emergency Medicine

## 2014-03-26 DIAGNOSIS — Z8673 Personal history of transient ischemic attack (TIA), and cerebral infarction without residual deficits: Secondary | ICD-10-CM | POA: Insufficient documentation

## 2014-03-26 DIAGNOSIS — Z7902 Long term (current) use of antithrombotics/antiplatelets: Secondary | ICD-10-CM | POA: Insufficient documentation

## 2014-03-26 DIAGNOSIS — E119 Type 2 diabetes mellitus without complications: Secondary | ICD-10-CM | POA: Insufficient documentation

## 2014-03-26 DIAGNOSIS — Z7982 Long term (current) use of aspirin: Secondary | ICD-10-CM | POA: Insufficient documentation

## 2014-03-26 DIAGNOSIS — Z792 Long term (current) use of antibiotics: Secondary | ICD-10-CM | POA: Insufficient documentation

## 2014-03-26 DIAGNOSIS — Z79899 Other long term (current) drug therapy: Secondary | ICD-10-CM | POA: Insufficient documentation

## 2014-03-26 DIAGNOSIS — Z8546 Personal history of malignant neoplasm of prostate: Secondary | ICD-10-CM | POA: Insufficient documentation

## 2014-03-26 DIAGNOSIS — J45909 Unspecified asthma, uncomplicated: Secondary | ICD-10-CM | POA: Insufficient documentation

## 2014-03-26 DIAGNOSIS — Z87891 Personal history of nicotine dependence: Secondary | ICD-10-CM | POA: Insufficient documentation

## 2014-03-26 DIAGNOSIS — R739 Hyperglycemia, unspecified: Secondary | ICD-10-CM

## 2014-03-26 LAB — URINALYSIS, ROUTINE W REFLEX MICROSCOPIC
Bilirubin Urine: NEGATIVE
Glucose, UA: >1000 mg/dL — AB
Hgb urine dipstick: NEGATIVE
KETONES UR: NEGATIVE mg/dL
LEUKOCYTES UA: NEGATIVE
NITRITE: NEGATIVE
PH: 6.5 (ref 5.0–8.0)
PROTEIN: NEGATIVE mg/dL
Specific Gravity, Urine: 1.03 (ref 1.005–1.030)
Urobilinogen, UA: 1 mg/dL (ref 0.0–1.0)

## 2014-03-26 LAB — URINE MICROSCOPIC-ADD ON

## 2014-03-26 LAB — COMPREHENSIVE METABOLIC PANEL
ALBUMIN: 3.4 g/dL — AB (ref 3.5–5.2)
ALT: 23 U/L (ref 0–53)
AST: 20 U/L (ref 0–37)
Alkaline Phosphatase: 125 U/L — ABNORMAL HIGH (ref 39–117)
BILIRUBIN TOTAL: 0.3 mg/dL (ref 0.3–1.2)
BUN: 19 mg/dL (ref 6–23)
CO2: 26 mEq/L (ref 19–32)
Calcium: 9.5 mg/dL (ref 8.4–10.5)
Chloride: 95 mEq/L — ABNORMAL LOW (ref 96–112)
Creatinine, Ser: 1.02 mg/dL (ref 0.50–1.35)
GFR calc Af Amer: 77 mL/min — ABNORMAL LOW (ref >90)
GFR calc non Af Amer: 66 mL/min — ABNORMAL LOW (ref >90)
Glucose, Bld: 643 mg/dL (ref 70–99)
Potassium: 4.4 mEq/L (ref 3.7–5.3)
Sodium: 134 mEq/L — ABNORMAL LOW (ref 137–147)
TOTAL PROTEIN: 7.2 g/dL (ref 6.0–8.3)

## 2014-03-26 LAB — CBC
HCT: 40.5 % (ref 39.0–52.0)
HEMOGLOBIN: 13.3 g/dL (ref 13.0–17.0)
MCH: 29.3 pg (ref 26.0–34.0)
MCHC: 32.8 g/dL (ref 30.0–36.0)
MCV: 89.2 fL (ref 78.0–100.0)
Platelets: 126 10*3/uL — ABNORMAL LOW (ref 150–400)
RBC: 4.54 MIL/uL (ref 4.22–5.81)
RDW: 13.2 % (ref 11.5–15.5)
WBC: 5.7 10*3/uL (ref 4.0–10.5)

## 2014-03-26 LAB — CBG MONITORING, ED
Glucose-Capillary: >600 mg/dL (ref 70–99)
Glucose-Capillary: 461 mg/dL — ABNORMAL HIGH (ref 70–99)
Glucose-Capillary: 300 mg/dL — ABNORMAL HIGH (ref 70–99)

## 2014-03-26 MED ORDER — INSULIN ASPART 100 UNIT/ML ~~LOC~~ SOLN
10.0000 [IU] | Freq: Once | SUBCUTANEOUS | Status: AC
  Administered 2014-03-26: 10 [IU] via INTRAVENOUS
  Filled 2014-03-26: qty 1

## 2014-03-26 MED ORDER — SODIUM CHLORIDE 0.9 % IV BOLUS (SEPSIS)
1000.0000 mL | Freq: Once | INTRAVENOUS | Status: AC
  Administered 2014-03-26: 1000 mL via INTRAVENOUS

## 2014-03-26 NOTE — ED Notes (Signed)
Pt reports that he is a type 2 diabetic and that the home health nurse came to check his CBG and it read high. States that he recently has been transitioned to insulin as well as pills. No other complaints.

## 2014-03-26 NOTE — Discharge Instructions (Signed)

## 2014-03-26 NOTE — ED Provider Notes (Signed)
CSN: [id number]     Arrival date & time 03/26/14  1705 History   First MD Initiated Contact with Patient 03/26/14 1711     Chief Complaint  Patient presents with  . Hyperglycemia     (Consider location/radiation/quality/duration/timing/severity/associated sxs/prior Treatment) Patient is a 80 y.o. male presenting with hyperglycemia. The history is provided by the patient.  Hyperglycemia Blood sugar level PTA:  >600 Severity:  Severe Onset quality:  Gradual Timing:  Constant Progression:  Unable to specify Diabetes status:  Controlled with oral medications (was supposed to change to insulin today) Current diabetic therapy:  Glimepiride Time since last antidiabetic medication:  1 day Context: change in medication   Relieved by:  None tried Ineffective treatments:  Oral agents Associated symptoms: blurred vision, increased thirst, polyuria and weight change   Associated symptoms: no abdominal pain, no altered mental status, no confusion, no nausea, no shortness of breath, no syncope and no vomiting   Risk factors: no hx of DKA and no recent steroid use     Past Medical History  Diagnosis Date  . Asthma   . Stroke   . Prostate cancer    Past Surgical History  Procedure Laterality Date  . Prostate surgery     Family History  Problem Relation Age of Onset  . Diabetes Maternal Aunt    History  Substance Use Topics  . Smoking status: Former Smoker    Types: Cigarettes    Quit date: 10/23/1957  . Smokeless tobacco: Not on file     Comment: Smoked 1/2 ppd x 23 years  . Alcohol Use: No    Review of Systems  Eyes: Positive for blurred vision.  Respiratory: Negative for shortness of breath.   Cardiovascular: Negative for syncope.  Gastrointestinal: Negative for nausea, vomiting and abdominal pain.  Endocrine: Positive for polydipsia and polyuria.  Psychiatric/Behavioral: Negative for confusion.  All other systems reviewed and are negative.     Allergies  Review of  patient's allergies indicates no known allergies.  Home Medications   Prior to Admission medications   Medication Sig Start Date End Date Taking? Authorizing Provider  albuterol (PROVENTIL HFA;VENTOLIN HFA) 108 (90 BASE) MCG/ACT inhaler Inhale 2 puffs into the lungs every 6 (six) hours as needed for wheezing or shortness of breath. 04/01/12  Yes Deneise Lever, MD  albuterol (PROVENTIL) (2.5 MG/3ML) 0.083% nebulizer solution Take 3 mLs (2.5 mg total) by nebulization every 6 (six) hours as needed for wheezing or shortness of breath. 10/28/13  Yes Deneise Lever, MD  aspirin 81 MG chewable tablet Chew 81 mg by mouth daily.   Yes Historical Provider, MD  carvedilol (COREG) 3.125 MG tablet Take 3.125 mg by mouth daily.  12/26/11  Yes Historical Provider, MD  clopidogrel (PLAVIX) 75 MG tablet Take 75 mg by mouth daily. 12/26/11  Yes Historical Provider, MD  CRESTOR 20 MG tablet Take 20 mg by mouth daily.  12/04/11  Yes Historical Provider, MD  fesoterodine (TOVIAZ) 4 MG TB24 Take 4 mg by mouth daily.   Yes Historical Provider, MD  furosemide (LASIX) 40 MG tablet Take 1 tablet by mouth daily. 01/25/13  Yes Historical Provider, MD  glimepiride (AMARYL) 2 MG tablet Take 1 tablet by mouth daily. 4mg  daily 09/08/12  Yes Historical Provider, MD  polyethylene glycol (MIRALAX / GLYCOLAX) packet Take 17 g by mouth daily as needed for mild constipation.    Yes Historical Provider, MD  ramipril (ALTACE) 2.5 MG capsule Take 1 tablet by mouth daily.  09/08/12  Yes Historical Provider, MD  cephALEXin (KEFLEX) 500 MG capsule Take 500 mg by mouth 4 (four) times daily. X 7 days. Completed course of medication a week ago from today (03-26-14) 02/11/14   Lahoma Rocker, PA   BP 154/73  Pulse 90  Temp(Src) 98.6 F (37 C) (Oral)  Resp 18  Ht 5\' 7"  (1.702 m)  Wt 174 lb (78.926 kg)  BMI 27.25 kg/m2  SpO2 100% Physical Exam  Nursing note and vitals reviewed. Constitutional: He is oriented to person, place, and time. He  appears well-developed and well-nourished. No distress.  HENT:  Head: Normocephalic and atraumatic.  Mouth/Throat: Oropharynx is clear and moist. Mucous membranes are dry.  Eyes: Conjunctivae and EOM are normal. Pupils are equal, round, and reactive to light.  Neck: Normal range of motion. Neck supple.  Cardiovascular: Normal rate, regular rhythm and intact distal pulses.   No murmur heard. Pulmonary/Chest: Effort normal and breath sounds normal. No respiratory distress. He has no wheezes. He has no rales.  Abdominal: Soft. He exhibits no distension. There is no tenderness. There is no rebound and no guarding.  Musculoskeletal: Normal range of motion. He exhibits edema. He exhibits no tenderness.  Trace pitting edema in lower ext R>L  Neurological: He is alert and oriented to person, place, and time.  Skin: Skin is warm and dry. No rash noted. No erythema.  Psychiatric: He has a normal mood and affect. His behavior is normal.    ED Course  Procedures (including critical care time) Labs Review Labs Reviewed  CBC - Abnormal; Notable for the following:    Platelets 126 (*)    All other components within normal limits  COMPREHENSIVE METABOLIC PANEL - Abnormal; Notable for the following:    Sodium 134 (*)    Chloride 95 (*)    Glucose, Bld 643 (*)    Albumin 3.4 (*)    Alkaline Phosphatase 125 (*)    GFR calc non Af Amer 66 (*)    GFR calc Af Amer 77 (*)    All other components within normal limits  URINALYSIS, ROUTINE W REFLEX MICROSCOPIC - Abnormal; Notable for the following:    Glucose, UA >1000 (*)    All other components within normal limits  CBG MONITORING, ED - Abnormal; Notable for the following:    Glucose-Capillary >600 (*)    All other components within normal limits  CBG MONITORING, ED - Abnormal; Notable for the following:    Glucose-Capillary 461 (*)    All other components within normal limits  CBG MONITORING, ED - Abnormal; Notable for the following:     Glucose-Capillary 300 (*)    All other components within normal limits  URINE MICROSCOPIC-ADD ON    Imaging Review No results found.   EKG Interpretation None      MDM   Final diagnoses:  None   Patient here do to elevated blood sugars today. Health came to evaluate him for the first time today to start him on insulin and rechecked his blood sugar was greater than 600. Family states that over the last several months his blood sugar has been in the 2 and 300s but unclear how long its been running critical high. He has polyuria, polydipsia but denies any fever, chest pain, abdominal pain, nausea or vomiting. Patient's last hemoglobin A1c was 14.  Hydrate patient, low suspicion for DKA. Will investigate for infection is another reason for elevated blood sugar but feel most likely due to the  recent medication switches and poor diet control.  CBC, CMP, UA pending. Patient started on IV fluids.  9:55 PM Patient after 2 fluid boluses of 1 dose of insulin now has a blood sugar of 300. He is much more awake and interactive and feels well. Home health is coming tomorrow for insulin training and further diabetic training. Family members with him and feel that patient can be discharged. Other than hyperglycemia on labs the rest of his lab results are within normal limits.   Blanchie Dessert, MD 03/26/14 2156

## 2014-03-26 NOTE — ED Notes (Addendum)
Dr. Plunkett at bedside.  

## 2014-03-26 NOTE — ED Notes (Signed)
PT reports his blood sugar read "HI" on his home machine and so he came in

## 2014-03-26 NOTE — ED Notes (Signed)
CBG taken and Nurse was informed of Critical High while in room with pt.

## 2014-03-26 NOTE — ED Notes (Signed)
Lab called to report high glucose level. MD notified

## 2014-03-26 NOTE — ED Notes (Signed)
MD at bedside. Plunkitt

## 2014-04-01 ENCOUNTER — Telehealth: Payer: Self-pay | Admitting: Internal Medicine

## 2014-04-01 MED ORDER — ALBUTEROL SULFATE HFA 108 (90 BASE) MCG/ACT IN AERS: 2.0000 | INHALATION_SPRAY | Freq: Four times a day (QID) | RESPIRATORY_TRACT | Status: DC | PRN

## 2014-04-01 NOTE — Telephone Encounter (Signed)
Spoke with Pearl-states that the patient needs refill for albuterol HFA inhaler to help with SOB flares that patient has. Pearl made aware that rx sent to pharmacy and if patient sees he is getting worse then they should call our office for appt or seek emergency care.

## 2014-04-01 NOTE — Addendum Note (Signed)
Addended by: Clayborne Dana C on: 04/01/2014 05:04 PM   Modules accepted: Orders

## 2014-04-28 ENCOUNTER — Ambulatory Visit (INDEPENDENT_AMBULATORY_CARE_PROVIDER_SITE_OTHER): Payer: Medicare Other | Admitting: Internal Medicine

## 2014-04-28 ENCOUNTER — Encounter: Payer: Self-pay | Admitting: Internal Medicine

## 2014-04-28 VITALS — BP 152/72 | HR 81 | Ht 68.0 in | Wt 182.6 lb

## 2014-04-28 DIAGNOSIS — I2589 Other forms of chronic ischemic heart disease: Secondary | ICD-10-CM

## 2014-04-28 DIAGNOSIS — J439 Emphysema, unspecified: Secondary | ICD-10-CM

## 2014-04-28 DIAGNOSIS — I255 Ischemic cardiomyopathy: Secondary | ICD-10-CM

## 2014-04-28 DIAGNOSIS — J438 Other emphysema: Secondary | ICD-10-CM

## 2014-04-28 NOTE — Patient Instructions (Addendum)
Your lungs seem to be very stable, and you haven't been needing to use the inhaler. I suggest you follow up now for your breathing with Dr Prince Rome as your primary physician. We will be happy to see you back here if needed for your lungs and breathing. Dr Prince Rome can refill your inhaler if needed.

## 2014-04-28 NOTE — Progress Notes (Signed)
01/01/12- 81 yoM former smoker followed for COPD. Last here 2008. Wife here. PCP Dr Doylene Canard. Spirometry 1999 had shown restriction of exhaled volume with FEV1/FVC 0.73. Medical problems include cardiomyopathy, history of CVA, glaucoma, history of resection of prostate cancer. Smoked one half pack per day for 23 years until quitting in 1973. Married and living with his wife, retired from a [employer]. History of pneumonia. Has had pneumonia vaccine at least twice, and flu vaccine. Told in May of 2012 his heart was only "35%" without history of MI. He gets short of breath especially at night-delays sleep onset. Some cough and wheeze only with colds. Some seasonal allergic rhinitis mainly spring and fall. Has home nebulizer with albuterol is only about once a month. Occasional choked drinking water without active reflux symptoms. He walks slowly and is not very active. Mild exertional dyspnea without much variation from one day to the next.  04/01/12- 49 yoM former smoker followed for COPD.          Wife here. PCP Dr Doylene Canard.  c/o sob with exertion otherwise doing good His COPD assessment test (CAT) score is 20/40. He is now on oxygen at night 2 L/Advanced. Dyspnea on exertion. Much cough and. Easily choked when he swallows. Not aware of reflux. Cough is usually productive. He denies fever, pain, blood. He is not using his nebulizer machine at all and has run out of his rescue inhaler.  09/30/12- 89 yoM former smoker followed for COPD, complicated by glaucoma, CM EF 35%          Wife here. PCP Dr Doylene Canard. FOLLOWS FOR: SOB with activity still. Some dyspnea on exertion but not bad or acute. Dry cough. Denies chest pain. Rarely needs his nebulizer. Continues to sleep with oxygen 2 L. CXR 01/09/12-reviewed IMPRESSION:  Cardiomegaly. Bronchitic changes. Right base atelectasis.  Original Report Authenticated By: Raelyn Number, M.D.  PFT: 04/18/2012 mild- moderate obstructive airways disease with  response to bronchodilator, normal lung volumes, normal diffusion.  FEV1 1.86/76%, FEV1/FVC 0.60, FEF 25-75% 0.78/37%. TLC 95%, DLCO 94%.  03/31/13- 57 yoM former smoker followed for COPD, fibrosis, complicated by glaucoma, CM EF 35%    PCP, Hx CVA   Dr Doylene Canard. Wears oxygen 2 L "sometimes" for sleep. Rarely uses rescue inhaler or nebulizer. Notices cough mostly only if swallowing and denies wheeze. CXR 10/22/12 IMPRESSION:  No acute abnormality. Slight progression of interstitial lung  disease.  Original Report Authenticated By: Lorriane Shire, M.D.  10/28/13-  39 yoM former smoker followed for COPD, fibrosis, complicated by glaucoma, CM EF 35%,     Hx CVA    PCP Dr Joneen Caraway.  Daughter here FOLLOWS FOR:  Breathing is unchanged.  c/o: cough x2 weeks non-productive Little cough now. Oxygen 2 L/Advanced is used if needed. Denies chest pain or blood.  04/28/14-84 yoM former smoker followed for COPD, fibrosis, complicated by glaucoma, CM EF 35%,     Hx CVA    PCP Dr Joneen Caraway.  Daughter here FOLLOWS FOR: Pt denies any troubles with his breathing while sitting or moving around(states he does not get up and move around much) He no longer has home oxygen-no longer qualifies. Overnight oximetry was done to/18/2015. He no longer has a nebulizer machine and rarely uses rescue inhaler.  ROS-see HPI Constitutional:   No-   weight loss, night sweats, fevers, chills, fatigue, lassitude. HEENT:   No-  headaches, difficulty swallowing, tooth/dental problems, sore throat,       No-  sneezing, itching, ear ache, nasal congestion, post nasal drip,  CV:  No-   chest pain, orthopnea, PND, swelling in lower extremities, anasarca, dizziness, palpitations Resp: + shortness of breath with exertion or at rest.              No-   productive cough, no- non-productive cough,  No- coughing up of blood.              No-   change in color of mucus.  No- wheezing.   Skin: No-   rash or lesions. GI:  No-    heartburn, indigestion, abdominal pain, nausea, vomiting,  GU:   MS:  No-   joint pain or swelling.    Neuro-     + falling without syncope Psych:  No- change in mood or affect. No depression or anxiety.  ? memory loss.  OBJ- Physical Exam   Room air O2 saturation 98% at rest General- Alert, Oriented, Affect-appropriate/ calm, Distress- none acute, slender,  Skin- rash-none, lesions- none, excoriation- none Lymphadenopathy- none Head- atraumatic            Eyes- Gross vision intact, PERRLA, conjunctivae and secretions clear            Ears- Hearing, canals-normal            Nose- Clear, no-Septal dev, mucus, polyps, erosion, perforation             Throat- Mallampati III , mucosa clear , drainage- none, tonsils- atrophic, dentures Neck- flexible , trachea midline, no stridor , thyroid nl, carotid no bruit Chest - symmetrical excursion , unlabored           Heart/CV- RRR , no murmur , +split S2  , no rub, widely split S1 or loud S3.                           - JVD- 1+ , edema-1+, stasis changes- none, varices- none           Lung- + faint basilar crackles, wheeze+trace on right, unlabored, cough- mild ,                                                 dullness-none, rub- none           Chest wall-  Abd- Br/ Gen/ Rectal- Not done, not indicated Extrem- cyanosis- none, clubbing, none, atrophy- none, strength-probably not normal for age.        +Using a walker  Neuro- slow speech, question droop right-sided mouth

## 2014-04-28 NOTE — Assessment & Plan Note (Signed)
There's not much change in. Much of his dyspnea is from his cardiac status, as well as his age and debilitation. Plan-will be happy to see him again if we can help but he doesn't need to make extra doctor visits to [facility]. His primary physician can refill inhaler if needed

## 2014-04-28 NOTE — Assessment & Plan Note (Signed)
Followed by cardiology 

## 2014-09-03 ENCOUNTER — Ambulatory Visit: Payer: Medicare Other | Admitting: Family Medicine

## 2014-09-22 ENCOUNTER — Ambulatory Visit (INDEPENDENT_AMBULATORY_CARE_PROVIDER_SITE_OTHER): Payer: Medicare Other | Admitting: Emergency Medicine

## 2014-09-22 ENCOUNTER — Ambulatory Visit (INDEPENDENT_AMBULATORY_CARE_PROVIDER_SITE_OTHER): Payer: Medicare Other

## 2014-09-22 VITALS — BP 110/70 | HR 84 | Temp 98.5°F | Resp 16 | Ht 70.0 in | Wt 183.0 lb

## 2014-09-22 DIAGNOSIS — R062 Wheezing: Secondary | ICD-10-CM

## 2014-09-22 DIAGNOSIS — Z8673 Personal history of transient ischemic attack (TIA), and cerebral infarction without residual deficits: Secondary | ICD-10-CM | POA: Insufficient documentation

## 2014-09-22 DIAGNOSIS — R05 Cough: Secondary | ICD-10-CM

## 2014-09-22 DIAGNOSIS — R059 Cough, unspecified: Secondary | ICD-10-CM

## 2014-09-22 DIAGNOSIS — J441 Chronic obstructive pulmonary disease with (acute) exacerbation: Secondary | ICD-10-CM

## 2014-09-22 DIAGNOSIS — I639 Cerebral infarction, unspecified: Secondary | ICD-10-CM

## 2014-09-22 MED ORDER — ALBUTEROL SULFATE (2.5 MG/3ML) 0.083% IN NEBU: 2.5000 mg | INHALATION_SOLUTION | Freq: Once | RESPIRATORY_TRACT | Status: DC

## 2014-09-22 MED ORDER — ALBUTEROL SULFATE (2.5 MG/3ML) 0.083% IN NEBU
5.0000 mg | INHALATION_SOLUTION | Freq: Once | RESPIRATORY_TRACT | Status: AC
  Administered 2014-09-22: 5 mg via RESPIRATORY_TRACT

## 2014-09-22 MED ORDER — IPRATROPIUM BROMIDE 0.02 % IN SOLN
0.5000 mg | Freq: Once | RESPIRATORY_TRACT | Status: AC
  Administered 2014-09-22: 0.5 mg via RESPIRATORY_TRACT

## 2014-09-22 MED ORDER — CLARITHROMYCIN 500 MG PO TABS: 500.0000 mg | ORAL_TABLET | Freq: Two times a day (BID) | ORAL | Status: DC

## 2014-09-22 NOTE — Progress Notes (Addendum)
[facility] [address] [place] [address] [phone]  Date:  09/22/2014   Name:  Dustin Leonard   DOB:  1928-11-12   MRN:  [id number]  PCP:  Patricia Nettle, MD    Chief Complaint: URI   History of Present Illness:  Dustin Leonard is a 80 y.o. very pleasant male patient who presents with the following:  Has cough productive mucopurulent sputum over past month.  Associated with increased shortness of breath. No PND or orthopnea. No fever or chills.   No wheezing. No nausea or vomiting.  No chest pain or peripheral edema Worsening so family brought him in. Using puffer sporadically but not daily  No improvement with over the counter medications or other home remedies.  Denies other complaint or health concern today.   Patient Active Problem List   Diagnosis Date Noted  . Stroke 09/22/2014  . Cardiomyopathy, ischemic 04/13/2013  . GLAUCOMA 12/02/2007  . COPD with emphysema 12/02/2007  . PROSTATE CANCER, HX OF 12/02/2007    Past Medical History  Diagnosis Date  . Asthma   . Stroke   . Prostate cancer     Past Surgical History  Procedure Laterality Date  . Prostate surgery      History  Substance Use Topics  . Smoking status: Former Smoker    Types: Cigarettes    Quit date: 10/23/1957  . Smokeless tobacco: Not on file     Comment: Smoked 1/2 ppd x 23 years  . Alcohol Use: No    Family History  Problem Relation Age of Onset  . Diabetes Maternal Aunt     No Known Allergies  Medication list has been reviewed and updated.  Current Outpatient Prescriptions on File Prior to Visit  Medication Sig Dispense Refill  . albuterol (PROVENTIL HFA;VENTOLIN HFA) 108 (90 BASE) MCG/ACT inhaler Inhale 2 puffs into the lungs every 6 (six) hours as needed for wheezing or shortness of breath. 1 Inhaler prn  . albuterol (PROVENTIL) (2.5 MG/3ML) 0.083% nebulizer solution Take 3 mLs (2.5 mg total) by nebulization every 6 (six) hours as needed for  wheezing or shortness of breath. 75 mL prn  . aspirin 81 MG chewable tablet Chew 81 mg by mouth daily.    . carvedilol (COREG) 3.125 MG tablet Take 3.125 mg by mouth daily.     . clopidogrel (PLAVIX) 75 MG tablet Take 75 mg by mouth daily.    . fesoterodine (TOVIAZ) 4 MG TB24 Take 4 mg by mouth daily.    . furosemide (LASIX) 40 MG tablet Take 1 tablet by mouth daily.    . insulin detemir (LEVEMIR) 100 UNIT/ML injection Inject 40 Units into the skin daily.    Marland Kitchen lovastatin (MEVACOR) 40 MG tablet Take 40 mg by mouth daily.    . polyethylene glycol (MIRALAX / GLYCOLAX) packet Take 17 g by mouth daily as needed for mild constipation.     . ramipril (ALTACE) 2.5 MG capsule Take 1 tablet by mouth daily.     No current facility-administered medications on file prior to visit.    Review of Systems:  As per HPI, otherwise negative.    Physical Examination: Filed Vitals:   09/22/14 1228  BP: 110/70  Pulse: 84  Temp: 98.5 F (36.9 C)  Resp: 16   Filed Vitals:   09/22/14 1228  Height: 5\' 10"  (1.778 m)  Weight: 183 lb (83.008 kg)   Body mass index is 26.26 kg/(m^2). Ideal Body Weight: Weight in (lb)  to have BMI = 25: 173.9  GEN: WDWN, NAD, Non-toxic, A & O x 3 HEENT: Atraumatic, Normocephalic. Neck supple. No masses, No LAD. Ears and Nose: No external deformity. CV: RRR, No M/G/R. No JVD. No thrill. No extra heart sounds. PULM: moderate diffuse wheezes, no crackles, rhonchi. No retractions. No resp. distress. No accessory muscle use. ABD: S, NT, ND, +BS. No rebound. No HSM. EXTR: No c/c/e NEURO Normal gait.  PSYCH: Normally interactive. Conversant. Not depressed or anxious appearing.  Calm demeanor.    Assessment and Plan: Exacerbation COPD biaxin  Signed,  Ellison Carwin, MD   [facility] reading (PRIMARY) by  Dr. Ouida Sills  Unchanged COPD.

## 2014-09-22 NOTE — Patient Instructions (Signed)

## 2016-06-30 ENCOUNTER — Inpatient Hospital Stay (HOSPITAL_COMMUNITY)
Admission: EM | Admit: 2016-06-30 | Discharge: 2016-07-03 | DRG: 191 | Disposition: A | Discharge status: Skilled Nursing Facility | Payer: Medicare Other | Attending: Student in an Organized Health Care Education/Training Program | Admitting: Student in an Organized Health Care Education/Training Program

## 2016-06-30 ENCOUNTER — Emergency Department (HOSPITAL_COMMUNITY): Payer: Medicare Other

## 2016-06-30 ENCOUNTER — Encounter (HOSPITAL_COMMUNITY): Payer: Self-pay | Admitting: Emergency Medicine

## 2016-06-30 DIAGNOSIS — Z87891 Personal history of nicotine dependence: Secondary | ICD-10-CM

## 2016-06-30 DIAGNOSIS — R4702 Dysphasia: Secondary | ICD-10-CM | POA: Diagnosis present

## 2016-06-30 DIAGNOSIS — R471 Dysarthria and anarthria: Secondary | ICD-10-CM | POA: Diagnosis not present

## 2016-06-30 DIAGNOSIS — Z794 Long term (current) use of insulin: Secondary | ICD-10-CM | POA: Diagnosis not present

## 2016-06-30 DIAGNOSIS — R29810 Facial weakness: Secondary | ICD-10-CM | POA: Diagnosis present

## 2016-06-30 DIAGNOSIS — Z7902 Long term (current) use of antithrombotics/antiplatelets: Secondary | ICD-10-CM | POA: Diagnosis not present

## 2016-06-30 DIAGNOSIS — Z79899 Other long term (current) drug therapy: Secondary | ICD-10-CM | POA: Diagnosis not present

## 2016-06-30 DIAGNOSIS — E1165 Type 2 diabetes mellitus with hyperglycemia: Secondary | ICD-10-CM

## 2016-06-30 DIAGNOSIS — R4701 Aphasia: Secondary | ICD-10-CM | POA: Diagnosis present

## 2016-06-30 DIAGNOSIS — Z8673 Personal history of transient ischemic attack (TIA), and cerebral infarction without residual deficits: Secondary | ICD-10-CM | POA: Diagnosis not present

## 2016-06-30 DIAGNOSIS — Z8546 Personal history of malignant neoplasm of prostate: Secondary | ICD-10-CM | POA: Diagnosis not present

## 2016-06-30 DIAGNOSIS — J189 Pneumonia, unspecified organism: Secondary | ICD-10-CM

## 2016-06-30 DIAGNOSIS — I447 Left bundle-branch block, unspecified: Secondary | ICD-10-CM | POA: Diagnosis present

## 2016-06-30 DIAGNOSIS — E119 Type 2 diabetes mellitus without complications: Secondary | ICD-10-CM | POA: Diagnosis present

## 2016-06-30 DIAGNOSIS — I255 Ischemic cardiomyopathy: Secondary | ICD-10-CM | POA: Diagnosis present

## 2016-06-30 DIAGNOSIS — R112 Nausea with vomiting, unspecified: Secondary | ICD-10-CM | POA: Diagnosis present

## 2016-06-30 DIAGNOSIS — J439 Emphysema, unspecified: Secondary | ICD-10-CM | POA: Diagnosis present

## 2016-06-30 DIAGNOSIS — N390 Urinary tract infection, site not specified: Secondary | ICD-10-CM | POA: Diagnosis present

## 2016-06-30 DIAGNOSIS — J441 Chronic obstructive pulmonary disease with (acute) exacerbation: Principal | ICD-10-CM | POA: Diagnosis present

## 2016-06-30 DIAGNOSIS — R0602 Shortness of breath: Secondary | ICD-10-CM | POA: Diagnosis present

## 2016-06-30 DIAGNOSIS — R1310 Dysphagia, unspecified: Secondary | ICD-10-CM | POA: Diagnosis present

## 2016-06-30 DIAGNOSIS — R29704 NIHSS score 4: Secondary | ICD-10-CM | POA: Diagnosis present

## 2016-06-30 DIAGNOSIS — Z7982 Long term (current) use of aspirin: Secondary | ICD-10-CM | POA: Diagnosis not present

## 2016-06-30 DIAGNOSIS — B9620 Unspecified Escherichia coli [E. coli] as the cause of diseases classified elsewhere: Secondary | ICD-10-CM | POA: Diagnosis present

## 2016-06-30 DIAGNOSIS — J069 Acute upper respiratory infection, unspecified: Secondary | ICD-10-CM | POA: Diagnosis present

## 2016-06-30 LAB — I-STAT CHEM 8, ED
BUN: 21 mg/dL — AB (ref 6–20)
CHLORIDE: 103 mmol/L (ref 101–111)
CREATININE: 1.1 mg/dL (ref 0.61–1.24)
Calcium, Ion: 1.1 mmol/L — ABNORMAL LOW (ref 1.15–1.40)
Glucose, Bld: 213 mg/dL — ABNORMAL HIGH (ref 65–99)
HEMATOCRIT: 42 % (ref 39.0–52.0)
Hemoglobin: 14.3 g/dL (ref 13.0–17.0)
Potassium: 4 mmol/L (ref 3.5–5.1)
Sodium: 142 mmol/L (ref 135–145)
TCO2: 27 mmol/L (ref 0–100)

## 2016-06-30 LAB — COMPREHENSIVE METABOLIC PANEL
ALBUMIN: 3.3 g/dL — AB (ref 3.5–5.0)
ALK PHOS: 72 U/L (ref 38–126)
ALT: 13 U/L — AB (ref 17–63)
ANION GAP: 8 (ref 5–15)
AST: 27 U/L (ref 15–41)
BILIRUBIN TOTAL: 0.8 mg/dL (ref 0.3–1.2)
BUN: 17 mg/dL (ref 6–20)
CALCIUM: 9 mg/dL (ref 8.9–10.3)
CO2: 25 mmol/L (ref 22–32)
Chloride: 108 mmol/L (ref 101–111)
Creatinine, Ser: 1.16 mg/dL (ref 0.61–1.24)
GFR calc Af Amer: >60 mL/min (ref >60)
GFR, EST NON AFRICAN AMERICAN: 56 mL/min — AB (ref >60)
GLUCOSE: 217 mg/dL — AB (ref 65–99)
Potassium: 4 mmol/L (ref 3.5–5.1)
Sodium: 141 mmol/L (ref 135–145)
TOTAL PROTEIN: 6.6 g/dL (ref 6.5–8.1)

## 2016-06-30 LAB — DIFFERENTIAL
BASOS ABS: 0 10*3/uL (ref 0.0–0.1)
Basophils Relative: 0 %
Eosinophils Absolute: 0 10*3/uL (ref 0.0–0.7)
Eosinophils Relative: 0 %
LYMPHS ABS: 3.6 10*3/uL (ref 0.7–4.0)
LYMPHS PCT: 25 %
MONOS PCT: 10 %
Monocytes Absolute: 1.4 10*3/uL — ABNORMAL HIGH (ref 0.1–1.0)
NEUTROS ABS: 9.3 10*3/uL — AB (ref 1.7–7.7)
NEUTROS PCT: 65 %

## 2016-06-30 LAB — CBC
HEMATOCRIT: 41 % (ref 39.0–52.0)
HEMOGLOBIN: 13.2 g/dL (ref 13.0–17.0)
MCH: 29 pg (ref 26.0–34.0)
MCHC: 32.2 g/dL (ref 30.0–36.0)
MCV: 90.1 fL (ref 78.0–100.0)
Platelets: 130 10*3/uL — ABNORMAL LOW (ref 150–400)
RBC: 4.55 MIL/uL (ref 4.22–5.81)
RDW: 13.4 % (ref 11.5–15.5)
WBC: 14.3 10*3/uL — AB (ref 4.0–10.5)

## 2016-06-30 LAB — RAPID URINE DRUG SCREEN, HOSP PERFORMED
Amphetamines: NOT DETECTED
BARBITURATES: NOT DETECTED
Benzodiazepines: NOT DETECTED
Cocaine: NOT DETECTED
OPIATES: NOT DETECTED
TETRAHYDROCANNABINOL: NOT DETECTED

## 2016-06-30 LAB — URINALYSIS, ROUTINE W REFLEX MICROSCOPIC
Bilirubin Urine: NEGATIVE
Glucose, UA: 500 mg/dL — AB
KETONES UR: NEGATIVE mg/dL
NITRITE: NEGATIVE
PROTEIN: 100 mg/dL — AB
Specific Gravity, Urine: 1.024 (ref 1.005–1.030)
pH: 5 (ref 5.0–8.0)

## 2016-06-30 LAB — URINE MICROSCOPIC-ADD ON

## 2016-06-30 LAB — BRAIN NATRIURETIC PEPTIDE: B NATRIURETIC PEPTIDE 5: 312.4 pg/mL — AB (ref 0.0–100.0)

## 2016-06-30 LAB — GLUCOSE, CAPILLARY: Glucose-Capillary: 311 mg/dL — ABNORMAL HIGH (ref 65–99)

## 2016-06-30 LAB — PROTIME-INR
INR: 1.1
Prothrombin Time: 14.3 seconds (ref 11.4–15.2)

## 2016-06-30 LAB — APTT: APTT: 31 s (ref 24–36)

## 2016-06-30 LAB — I-STAT TROPONIN, ED: Troponin i, poc: 0.01 ng/mL (ref 0.00–0.08)

## 2016-06-30 LAB — MRSA PCR SCREENING: MRSA by PCR: NEGATIVE

## 2016-06-30 LAB — I-STAT CG4 LACTIC ACID, ED: LACTIC ACID, VENOUS: 1.47 mmol/L (ref 0.5–1.9)

## 2016-06-30 LAB — CBG MONITORING, ED: Glucose-Capillary: 221 mg/dL — ABNORMAL HIGH (ref 65–99)

## 2016-06-30 MED ORDER — PREDNISONE 20 MG PO TABS
40.0000 mg | ORAL_TABLET | Freq: Every day | ORAL | Status: DC
  Filled 2016-06-30: qty 2

## 2016-06-30 MED ORDER — ONDANSETRON HCL 4 MG/2ML IJ SOLN
INTRAMUSCULAR | Status: AC
  Administered 2016-06-30: 4 mg
  Filled 2016-06-30: qty 2

## 2016-06-30 MED ORDER — INSULIN ASPART 100 UNIT/ML ~~LOC~~ SOLN
0.0000 [IU] | Freq: Every day | SUBCUTANEOUS | Status: DC
Start: 2016-06-30 — End: 2016-07-03
  Administered 2016-06-30: 4 [IU] via SUBCUTANEOUS
  Administered 2016-07-01: 2 [IU] via SUBCUTANEOUS
  Administered 2016-07-02: 3 [IU] via SUBCUTANEOUS

## 2016-06-30 MED ORDER — ACETAMINOPHEN 650 MG RE SUPP: 650.0000 mg | Freq: Four times a day (QID) | RECTAL | Status: DC | PRN

## 2016-06-30 MED ORDER — ACETAMINOPHEN 325 MG PO TABS: 650.0000 mg | ORAL_TABLET | Freq: Four times a day (QID) | ORAL | Status: DC | PRN

## 2016-06-30 MED ORDER — ASPIRIN 81 MG PO CHEW
81.0000 mg | CHEWABLE_TABLET | Freq: Every day | ORAL | Status: DC
  Administered 2016-06-30 – 2016-07-03 (×3): 81 mg via ORAL
  Filled 2016-06-30 (×3): qty 1

## 2016-06-30 MED ORDER — LABETALOL HCL 5 MG/ML IV SOLN
10.0000 mg | Freq: Once | INTRAVENOUS | Status: DC
  Filled 2016-06-30: qty 4

## 2016-06-30 MED ORDER — GUAIFENESIN ER 600 MG PO TB12
600.0000 mg | ORAL_TABLET | Freq: Two times a day (BID) | ORAL | Status: DC
  Administered 2016-06-30 – 2016-07-03 (×3): 600 mg via ORAL
  Filled 2016-06-30 (×4): qty 1

## 2016-06-30 MED ORDER — ENOXAPARIN SODIUM 40 MG/0.4ML ~~LOC~~ SOLN
40.0000 mg | SUBCUTANEOUS | Status: DC
  Administered 2016-06-30 – 2016-07-02 (×3): 40 mg via SUBCUTANEOUS
  Filled 2016-06-30 (×3): qty 0.4

## 2016-06-30 MED ORDER — INSULIN ASPART 100 UNIT/ML ~~LOC~~ SOLN
0.0000 [IU] | Freq: Three times a day (TID) | SUBCUTANEOUS | Status: DC
  Administered 2016-07-01 (×2): 3 [IU] via SUBCUTANEOUS
  Administered 2016-07-01: 8 [IU] via SUBCUTANEOUS
  Administered 2016-07-02: 2 [IU] via SUBCUTANEOUS
  Administered 2016-07-02: 5 [IU] via SUBCUTANEOUS
  Administered 2016-07-02: 11 [IU] via SUBCUTANEOUS
  Administered 2016-07-03 (×2): 3 [IU] via SUBCUTANEOUS

## 2016-06-30 MED ORDER — RAMIPRIL 2.5 MG PO CAPS
10.0000 mg | ORAL_CAPSULE | Freq: Every day | ORAL | Status: DC
  Administered 2016-07-02 – 2016-07-03 (×2): 10 mg via ORAL
  Filled 2016-06-30 (×2): qty 4
  Filled 2016-06-30: qty 1

## 2016-06-30 MED ORDER — INSULIN DETEMIR 100 UNIT/ML ~~LOC~~ SOLN
20.0000 [IU] | Freq: Every day | SUBCUTANEOUS | Status: DC
  Administered 2016-07-02 – 2016-07-03 (×2): 20 [IU] via SUBCUTANEOUS
  Filled 2016-06-30 (×3): qty 0.2

## 2016-06-30 MED ORDER — FUROSEMIDE 40 MG PO TABS
40.0000 mg | ORAL_TABLET | Freq: Every day | ORAL | Status: DC
  Administered 2016-06-30 – 2016-07-03 (×3): 40 mg via ORAL
  Filled 2016-06-30 (×3): qty 1

## 2016-06-30 MED ORDER — ONDANSETRON HCL 4 MG/2ML IJ SOLN
4.0000 mg | Freq: Once | INTRAMUSCULAR | Status: AC
  Administered 2016-06-30: 4 mg via INTRAVENOUS
  Filled 2016-06-30: qty 2

## 2016-06-30 MED ORDER — ALBUTEROL SULFATE (2.5 MG/3ML) 0.083% IN NEBU: 5.0000 mg | INHALATION_SOLUTION | Freq: Once | RESPIRATORY_TRACT | Status: DC

## 2016-06-30 MED ORDER — LEVOFLOXACIN IN D5W 750 MG/150ML IV SOLN
750.0000 mg | Freq: Once | INTRAVENOUS | Status: AC
  Administered 2016-06-30: 750 mg via INTRAVENOUS
  Filled 2016-06-30: qty 150

## 2016-06-30 MED ORDER — CLOPIDOGREL BISULFATE 75 MG PO TABS
75.0000 mg | ORAL_TABLET | Freq: Every day | ORAL | Status: DC
  Administered 2016-06-30 – 2016-07-03 (×3): 75 mg via ORAL
  Filled 2016-06-30 (×3): qty 1

## 2016-06-30 MED ORDER — IPRATROPIUM-ALBUTEROL 0.5-2.5 (3) MG/3ML IN SOLN
3.0000 mL | Freq: Four times a day (QID) | RESPIRATORY_TRACT | Status: DC
  Administered 2016-06-30 – 2016-07-01 (×4): 3 mL via RESPIRATORY_TRACT
  Filled 2016-06-30 (×4): qty 3

## 2016-06-30 MED ORDER — CARVEDILOL 3.125 MG PO TABS
3.1250 mg | ORAL_TABLET | Freq: Two times a day (BID) | ORAL | Status: DC
  Administered 2016-07-02 – 2016-07-03 (×3): 3.125 mg via ORAL
  Filled 2016-06-30 (×4): qty 1

## 2016-06-30 NOTE — ED Notes (Signed)
Per Dr. Lita Mains, patient is NOT a code stroke.  CT Head w/o Contrast orders placed in accordance with this.  CT updated.

## 2016-06-30 NOTE — H&P (Signed)
Date: 06/30/2016               Patient Name:  Dustin Leonard MRN: [id number]  DOB: Jan 19, 1929 Age / Sex: 83 y.o., male   PCP: Katherina Mires, MD         Medical Service: Internal Medicine Teaching Service         Attending Physician: Dr. Axel Filler, MD    First Contact: Dr. Heber Luna Pager: [phone]  Second Contact: Dr. Quay Burow Pager: [phone]       After Hours (After 5p/  First Contact Pager: [phone]  weekends / holidays): Second Contact Pager: [phone]   Chief Complaint: Shortness of breath   History of Present Illness: Mr. Breaux is an 82 year old male with past medical history of asthma, prostate cancer, CVA, type II diabetes that presents to the ED with shortness of breath.  Wife and son are at the bedside and help provide history.   This morning, patient's wife went to get him out of bed but he couldn't get up due to weakness. Per son, his speech is at baseline normal, since his TIA several years ago. His normal daily activities are going outside and sit under the [place]. He spoke to his son on the phone and his breathing was labored and stated he couldn't breath and couldn't get out of bed. In the last 5 years, patient has been doing well without hospitalization.  Patient follows with his physician in [place]. Yesterday, patient was in his normal state of health. He has been coughing more than usual and has a productive cough. He also had questionable nausea with vomiting this morning. He admits to lower extremity swelling and is currently at baseline. He denies sick contacts. Patient denies fever or chills, chest pain, abdominal pain, dysuria or urinary frequency.      Meds:  Current Meds  Medication Sig  . albuterol (PROVENTIL HFA;VENTOLIN HFA) 108 (90 BASE) MCG/ACT inhaler Inhale 2 puffs into the lungs every 6 (six) hours as needed for wheezing or shortness of breath.  Marland Kitchen aspirin 81 MG chewable tablet Chew 81 mg by mouth daily.  . carvedilol (COREG) 3.125 MG tablet  Take 3.125 mg by mouth 2 (two) times daily with a meal.   . Cholecalciferol 1000 units tablet Take 1,000 Units by mouth daily.  . clopidogrel (PLAVIX) 75 MG tablet Take 75 mg by mouth daily.  . furosemide (LASIX) 40 MG tablet Take 1 tablet by mouth daily.  . insulin detemir (LEVEMIR) 100 UNIT/ML injection Inject 47 Units into the skin daily as needed (takes only as needed).   . ramipril (ALTACE) 10 MG capsule Take 10 mg by mouth daily.   Allergies: Allergies as of 06/30/2016  . (No Known Allergies)   Past Medical History:  Diagnosis Date  . Asthma   . Prostate cancer ([place])   . Stroke [facility])     Family History:  Family History  Problem Relation Age of Onset  . Diabetes Maternal Aunt     Social History:  Tobacco: former smoker.  He reports quitting 20-30 years ago and smoked 1 pack a day for approximately 10 years.   Alcohol: denies Illicit Drugs: denies  Review of Systems: A complete ROS was negative except as per HPI.   Physical Exam: Blood pressure (!) 169/102, pulse 101, temperature 98.9 F (37.2 C), temperature source Oral, resp. rate (!) 30, SpO2 95 %. Vitals:   06/30/16 1430 06/30/16 1455 06/30/16 1500 06/30/16 1845  BP: 148/69 187/71 Marland Kitchen)  169/102 (!) 165/80  Pulse: 96 105 101   Resp: (!) 30 (!) 33 (!) 30   Temp:      TempSrc:      SpO2: 97% 94% 95%   Weight:    188 lb 7.9 oz (85.5 kg)  Height:    6\' 8"  (2.032 m)   General: Vital signs reviewed.  Patient is well-developed and well-nourished, in no acute distress and cooperative with exam.  Head: Normocephalic and atraumatic. Eyes: EOMI, conjunctivae normal, no scleral icterus.  Cardiovascular: RRR, S1 normal, S2 normal, no murmurs, gallops, or rubs. Pulmonary/Chest: Diffuse rhonchi bilaterally and expiratory wheezing Abdominal: Soft, non-tender, non-distended, BS +, no masses, organomegaly, or guarding present.  Musculoskeletal: No joint deformities, erythema, or stiffness, ROM full and nontender. Extremities:  lower extremity edema bilaterally Neurological: Oriented to person and place but not time, Strength is normal and symmetric bilaterally, cranial nerve II-XII are grossly intact, no focal motor deficit Skin: Warm, dry and intact. No rashes or erythema. Psychiatric: Normal mood and affect.  EKG: Sinus tachycardia  CXR:  DG Chest port 1 view FINDINGS: Mild cardiac enlargement. There is aortic atherosclerosis noted. Pulmonary vascular congestion is identified. No airspace consolidation.  IMPRESSION: Aortic atherosclerosis and pulmonary vascular congestion  Assessment & Plan by Problem: Acute COPD exacerbation Patient has had a worsening productive cough and URI is likely triggering his COPD. He is stating well on room air.  On exam he has diffuse rhonchi and expiratory wheezing.   - Prednisone 40mg  - Duoneb Q6 - Levofloxacin 750mg  IV  Possible Aspiration pneumonia Questionable vomiting this morning which may have led to an aspiration pneumonia. This is not evident on current chest x-ray. He is currently afebrile and has mild leukocytosis at 14.   -Repeat CXR tomorrow.  UTI Patient denies dysuria or urinary frequency.  Urinalysis shows large leukocytes and too numerous to count WBC suggestive of urinary tract infection. - Levofloxacin 750mg  IV  Diabetes mellitus type 2 - Continue home Levemir - SSI  History of CVA Per family's history patient is at baseline speech and they have not noted any generalized or focal weakness. - Continue home ASA - Continue home Plavix     Dispo: Admit patient to Observation with expected length of stay less than 2 midnights.  Signed: Valinda Party, DO 06/30/2016, 4:16 PM  Pager: [phone]

## 2016-06-30 NOTE — ED Provider Notes (Signed)
[place] DEPT Provider Note   CSN: [id number] Arrival date & time: 06/30/16  1319     History   Chief Complaint Chief Complaint  Patient presents with  . Shortness of Breath  . Weakness  . Facial Droop    HPI Marciano Mega is a 82 y.o. male.  HPI Low 5 caveat due to aphasia. Patient noticed to have right-sided facial droop this afternoon. Last seen normal was yesterday evening around 8:30. Noted to be in respiratory distress by EMS. Given albuterol nebulizer and Solu-Medrol. Past Medical History:  Diagnosis Date  . Asthma   . Prostate cancer ([place])   . Stroke [facility])     Patient Active Problem List   Diagnosis Date Noted  . Facial droop 06/30/2016  . Stroke ([place]) 09/22/2014  . Cardiomyopathy, ischemic 04/13/2013  . GLAUCOMA 12/02/2007  . COPD with emphysema ([place]) 12/02/2007  . PROSTATE CANCER, HX OF 12/02/2007    Past Surgical History:  Procedure Laterality Date  . PROSTATE SURGERY         Home Medications    Prior to Admission medications   Medication Sig Start Date End Date Taking? Authorizing Provider  albuterol (PROVENTIL HFA;VENTOLIN HFA) 108 (90 BASE) MCG/ACT inhaler Inhale 2 puffs into the lungs every 6 (six) hours as needed for wheezing or shortness of breath. 04/01/14  Yes Deneise Lever, MD  aspirin 81 MG chewable tablet Chew 81 mg by mouth daily.   Yes Historical Provider, MD  carvedilol (COREG) 3.125 MG tablet Take 3.125 mg by mouth 2 (two) times daily with a meal.  12/26/11  Yes Historical Provider, MD  Cholecalciferol 1000 units tablet Take 1,000 Units by mouth daily.   Yes Historical Provider, MD  clopidogrel (PLAVIX) 75 MG tablet Take 75 mg by mouth daily. 12/26/11  Yes Historical Provider, MD  furosemide (LASIX) 40 MG tablet Take 1 tablet by mouth daily. 01/25/13  Yes Historical Provider, MD  insulin detemir (LEVEMIR) 100 UNIT/ML injection Inject 47 Units into the skin daily as needed (takes only as needed).  02/24/14  Yes Historical Provider, MD    ramipril (ALTACE) 10 MG capsule Take 10 mg by mouth daily.   Yes Historical Provider, MD  albuterol (PROVENTIL) (2.5 MG/3ML) 0.083% nebulizer solution Take 3 mLs (2.5 mg total) by nebulization every 6 (six) hours as needed for wheezing or shortness of breath. Patient not taking: Reported on 06/30/2016 10/28/13   Deneise Lever, MD  clarithromycin (BIAXIN) 500 MG tablet Take 1 tablet (500 mg total) by mouth 2 (two) times daily. Patient not taking: Reported on 06/30/2016 09/22/14   Roselee Culver, MD    Family History Family History  Problem Relation Age of Onset  . Diabetes Maternal Aunt     Social History Social History  Substance Use Topics  . Smoking status: Former Smoker    Types: Cigarettes    Quit date: 10/23/1957  . Smokeless tobacco: Not on file     Comment: Smoked 1/2 ppd x 23 years  . Alcohol use No     Allergies   Review of patient's allergies indicates no known allergies.   Review of Systems Review of Systems  Unable to perform ROS: Patient nonverbal     Physical Exam Updated Vital Signs BP (!) 169/102   Pulse 101   Temp 98.9 F (37.2 C) (Oral)   Resp (!) 30   SpO2 95%   Physical Exam  Constitutional: He is oriented to person, place, and time. He appears well-developed.  HENT:  Head: Normocephalic and atraumatic.  Mouth/Throat: Oropharynx is clear and moist.  Patient with right-sided facial droop and drooling  Eyes: EOM are normal. Pupils are equal, round, and reactive to light.  Neck: Normal range of motion. Neck supple.  No meningismus  Cardiovascular:  Tachycardia. Irregularly irregular.  Pulmonary/Chest: Effort normal and breath sounds normal.  Abdominal: Soft. Bowel sounds are normal. There is no tenderness. There is no rebound and no guarding.  Musculoskeletal: Normal range of motion. He exhibits no edema or tenderness.  No lower extremity swelling, asymmetry or tenderness. 2+ dorsalis pedis and posterior tibial pulses bilaterally.   Neurological: He is alert and oriented to person, place, and time.  Appears to have left greater than right lower extremity weakness. Sensation is grossly intact. Right lower facial droop.  Skin: Skin is warm and dry. Capillary refill takes less than 2 seconds. No rash noted. No erythema.  Nursing note and vitals reviewed.    ED Treatments / Results  Labs (all labs ordered are listed, but only abnormal results are displayed) Labs Reviewed  CBC - Abnormal; Notable for the following:       Result Value   WBC 14.3 (*)    Platelets 130 (*)    All other components within normal limits  DIFFERENTIAL - Abnormal; Notable for the following:    Neutro Abs 9.3 (*)    Monocytes Absolute 1.4 (*)    All other components within normal limits  COMPREHENSIVE METABOLIC PANEL - Abnormal; Notable for the following:    Glucose, Bld 217 (*)    Albumin 3.3 (*)    ALT 13 (*)    GFR calc non Af Amer 56 (*)    All other components within normal limits  URINALYSIS, ROUTINE W REFLEX MICROSCOPIC (NOT AT [facility]) - Abnormal; Notable for the following:    APPearance TURBID (*)    Glucose, UA 500 (*)    Hgb urine dipstick MODERATE (*)    Protein, ur 100 (*)    Leukocytes, UA LARGE (*)    All other components within normal limits  URINE MICROSCOPIC-ADD ON - Abnormal; Notable for the following:    Squamous Epithelial / LPF 0-5 (*)    Bacteria, UA MANY (*)    All other components within normal limits  CBG MONITORING, ED - Abnormal; Notable for the following:    Glucose-Capillary 221 (*)    All other components within normal limits  I-STAT CHEM 8, ED - Abnormal; Notable for the following:    BUN 21 (*)    Glucose, Bld 213 (*)    Calcium, Ion 1.10 (*)    All other components within normal limits  PROTIME-INR  APTT  URINE RAPID DRUG SCREEN, HOSP PERFORMED  ETHANOL  BRAIN NATRIURETIC PEPTIDE  I-STAT TROPOININ, ED  I-STAT CG4 LACTIC ACID, ED    EKG  EKG Interpretation  Date/Time:  Friday June 30 2016 13:22:51 EDT Ventricular Rate:  106 PR Interval:    QRS Duration: 132 QT Interval:  336 QTC Calculation: 447 R Axis:   72 Text Interpretation:  Supraventricular bigeminy IVCD, consider atypical LBBB Confirmed by Lita Mains  MD, Delron Comer ([id number]) on 06/30/2016 1:40:02 PM Also confirmed by Lita Mains  MD, Amyjo Mizrachi ([id number]), editor [place], Joelene Millin [phone])  on 06/30/2016 1:59:59 PM       Radiology Ct Head Wo Contrast  Result Date: 06/30/2016 CLINICAL DATA:  Unresponsive patient. Right-sided facial droop and weakness. EXAM: CT HEAD WITHOUT CONTRAST TECHNIQUE: Contiguous axial images were obtained from  the base of the skull through the vertex without intravenous contrast. COMPARISON:  MRI 03/19/2011, CT scan brain 03/18/2011, 03/14/2011 FINDINGS: Brain: There is no acute territorial infarction, intracranial hemorrhage, or focal mass lesion identified. There are no extra-axial fluid collections. Old infarcts involving the left basal ganglia and corona radiata. New mild ex vacuo dilatation of the anterior aspect of left lateral ventricle. Ventricles are otherwise stable in morphology. No mass effect. No midline shift. There has been progression of cortical volume loss. Physiologic basal ganglial calcifications. Vascular: Vascular calcifications are present within the vertebrobasilar system as well as within the internal carotid arteries at the skullbase. Mild increased density in the region of left middle cerebral artery on axial views but appears symmetric on coronal views. Skull: No acute fracture or suspicious bone lesion is visualized. Incidental note is made of incomplete fusion of posterior arch of C1 as before. Mastoid air cells are clear. Sinuses/Orbits: Mild mucosal thickening visualized within the maxillary and ethmoid sinuses. There is bilateral aphakia. Globes otherwise appear intact. Other: Soft tissue or cardiologic disc calcifications are again noted within the bilateral pinna IMPRESSION: 1. No definite  acute intracranial abnormality. MRI follow-up may be obtained as indicated given clinical history. 2. Old infarct involving the left basal ganglia and left periventricular white matter. 3. Interval increase in cerebral volume loss 4. Mild sinus disease Electronically Signed   By: Donavan Foil M.D.   On: 06/30/2016 15:07   Dg Chest Port 1 View  Result Date: 06/30/2016 CLINICAL DATA:  Shortness of breath and unresponsiveness EXAM: PORTABLE CHEST 1 VIEW COMPARISON:  09/22/2014 FINDINGS: Mild cardiac enlargement. There is aortic atherosclerosis noted. Pulmonary vascular congestion is identified. No airspace consolidation. IMPRESSION: Aortic atherosclerosis and pulmonary vascular congestion. Electronically Signed   By: Kerby Moors M.D.   On: 06/30/2016 14:39    Procedures Procedures (including critical care time)  Medications Ordered in ED Medications  albuterol (PROVENTIL) (2.5 MG/3ML) 0.083% nebulizer solution 5 mg (not administered)  levofloxacin (LEVAQUIN) IVPB 750 mg (not administered)  ondansetron (ZOFRAN) injection 4 mg (4 mg Intravenous Given 06/30/16 1400)  ondansetron (ZOFRAN) 4 MG/2ML injection (4 mg  Given 06/30/16 1453)     Initial Impression / Assessment and Plan / ED Course  I have reviewed the triage vital signs and the nursing notes.  Pertinent labs & imaging results that were available during my care of the patient were reviewed by me and considered in my medical decision making (see chart for details).  Clinical Course   Discussed with Dr. Leonel Ramsay. We will evaluate in the emergency department. Patient with persistent wheezing. Elevation in white blood cell count. Concern for possible aspiration versus pneumonia versus COPD exacerbation. We'll start antibiotic. No acute changes on CT. Discussed with internal medicine service. Will admit to step down bed. PCP: Suzanna Obey Final Clinical Impressions(s) / ED Diagnoses   Final diagnoses:  COPD exacerbation ([place])  Facial  droop  UTI (lower urinary tract infection)    New Prescriptions New Prescriptions   No medications on file     Julianne Rice, MD 06/30/16 1555

## 2016-06-30 NOTE — ED Triage Notes (Signed)
Pt in from home via [facility] EMS with SOB, unresponsiveness, R side facial droop and weakness. Per EMS, pt was called to scene for unresponsiveness and sob. When they arrived, they noticed R side facial droop at 1215, new per wife. Pt having n/v on arrival, HR 112. Hx of CVA

## 2016-06-30 NOTE — Progress Notes (Signed)
Spoke with pts son, states pt has intermittent choking spells while eating. Will keep pt NPO and request MD to reassess PO med routes till swallow evaluation by speech therapy is completed

## 2016-07-01 ENCOUNTER — Encounter (HOSPITAL_COMMUNITY): Payer: Self-pay

## 2016-07-01 ENCOUNTER — Inpatient Hospital Stay (HOSPITAL_COMMUNITY): Payer: Medicare Other

## 2016-07-01 DIAGNOSIS — Z794 Long term (current) use of insulin: Secondary | ICD-10-CM

## 2016-07-01 DIAGNOSIS — R1310 Dysphagia, unspecified: Secondary | ICD-10-CM

## 2016-07-01 DIAGNOSIS — B9689 Other specified bacterial agents as the cause of diseases classified elsewhere: Secondary | ICD-10-CM

## 2016-07-01 DIAGNOSIS — J441 Chronic obstructive pulmonary disease with (acute) exacerbation: Principal | ICD-10-CM

## 2016-07-01 DIAGNOSIS — Z8673 Personal history of transient ischemic attack (TIA), and cerebral infarction without residual deficits: Secondary | ICD-10-CM

## 2016-07-01 DIAGNOSIS — Z7982 Long term (current) use of aspirin: Secondary | ICD-10-CM

## 2016-07-01 DIAGNOSIS — E119 Type 2 diabetes mellitus without complications: Secondary | ICD-10-CM

## 2016-07-01 DIAGNOSIS — N390 Urinary tract infection, site not specified: Secondary | ICD-10-CM

## 2016-07-01 DIAGNOSIS — R471 Dysarthria and anarthria: Secondary | ICD-10-CM

## 2016-07-01 LAB — COMPREHENSIVE METABOLIC PANEL
ALBUMIN: 2.7 g/dL — AB (ref 3.5–5.0)
ALT: 11 U/L — ABNORMAL LOW (ref 17–63)
AST: 25 U/L (ref 15–41)
Alkaline Phosphatase: 65 U/L (ref 38–126)
Anion gap: 8 (ref 5–15)
BUN: 26 mg/dL — AB (ref 6–20)
CHLORIDE: 107 mmol/L (ref 101–111)
CO2: 25 mmol/L (ref 22–32)
Calcium: 8.6 mg/dL — ABNORMAL LOW (ref 8.9–10.3)
Creatinine, Ser: 1.27 mg/dL — ABNORMAL HIGH (ref 0.61–1.24)
GFR calc Af Amer: 58 mL/min — ABNORMAL LOW (ref >60)
GFR, EST NON AFRICAN AMERICAN: 50 mL/min — AB (ref >60)
Glucose, Bld: 323 mg/dL — ABNORMAL HIGH (ref 65–99)
POTASSIUM: 4.6 mmol/L (ref 3.5–5.1)
Sodium: 140 mmol/L (ref 135–145)
Total Bilirubin: 0.6 mg/dL (ref 0.3–1.2)
Total Protein: 5.6 g/dL — ABNORMAL LOW (ref 6.5–8.1)

## 2016-07-01 LAB — GLUCOSE, CAPILLARY
GLUCOSE-CAPILLARY: 267 mg/dL — AB (ref 65–99)
GLUCOSE-CAPILLARY: 190 mg/dL — AB (ref 65–99)
GLUCOSE-CAPILLARY: 246 mg/dL — AB (ref 65–99)
Glucose-Capillary: 164 mg/dL — ABNORMAL HIGH (ref 65–99)

## 2016-07-01 MED ORDER — LEVOFLOXACIN IN D5W 750 MG/150ML IV SOLN
750.0000 mg | Freq: Once | INTRAVENOUS | Status: DC
  Filled 2016-07-01: qty 150

## 2016-07-01 MED ORDER — ONDANSETRON HCL 4 MG PO TABS: 4.0000 mg | ORAL_TABLET | Freq: Three times a day (TID) | ORAL | Status: DC | PRN

## 2016-07-01 MED ORDER — LEVOFLOXACIN IN D5W 750 MG/150ML IV SOLN
750.0000 mg | INTRAVENOUS | Status: DC
  Administered 2016-07-01: 750 mg via INTRAVENOUS
  Filled 2016-07-01 (×2): qty 150

## 2016-07-01 MED ORDER — ONDANSETRON HCL 4 MG/2ML IJ SOLN
4.0000 mg | Freq: Three times a day (TID) | INTRAMUSCULAR | Status: DC | PRN
  Administered 2016-07-01: 4 mg via INTRAVENOUS
  Filled 2016-07-01: qty 2

## 2016-07-01 MED ORDER — METHYLPREDNISOLONE SODIUM SUCC 40 MG IJ SOLR
40.0000 mg | Freq: Once | INTRAMUSCULAR | Status: AC
  Administered 2016-07-01: 40 mg via INTRAVENOUS
  Filled 2016-07-01: qty 1

## 2016-07-01 MED ORDER — IPRATROPIUM-ALBUTEROL 0.5-2.5 (3) MG/3ML IN SOLN
3.0000 mL | Freq: Two times a day (BID) | RESPIRATORY_TRACT | Status: DC
  Administered 2016-07-01 – 2016-07-02 (×2): 3 mL via RESPIRATORY_TRACT
  Filled 2016-07-01 (×2): qty 3

## 2016-07-01 MED ORDER — PREDNISONE 20 MG PO TABS: 40.0000 mg | ORAL_TABLET | Freq: Every day | ORAL | Status: DC

## 2016-07-01 MED ORDER — SODIUM CHLORIDE 0.9 % IV SOLN
INTRAVENOUS | Status: AC
  Administered 2016-07-01 – 2016-07-02 (×2): via INTRAVENOUS

## 2016-07-01 MED ORDER — RESOURCE THICKENUP CLEAR PO POWD
ORAL | Status: DC | PRN
  Filled 2016-07-01: qty 125

## 2016-07-01 MED ORDER — METHYLPREDNISOLONE SODIUM SUCC 40 MG IJ SOLR: 40.0000 mg | Freq: Every day | INTRAMUSCULAR | Status: DC

## 2016-07-01 NOTE — Progress Notes (Signed)
Am meds moved to afternoon, speech therapy up this am and did bedside swallow eval, pt needs a MBS, therefore meds not given this am.  Will plan to give meds after MBS done this afternoon when eval gives more info on where po's are going when given.

## 2016-07-01 NOTE — Plan of Care (Signed)
Physical Therapy Evaluation Patient Details Name: Dustin Leonard MRN: [id number] DOB: April 04, 1929 Today's Date: 07/01/2016   History of Present Illness  Patient presents with COPD and facial drop. Past medical history includes past stroke and prostate cancer.    Clinical Impression  Patient presents with limited balance , endurance, and difficulty transferring. His past level of activity was difficult to assess 2nd to cognition. If he goes home he will require 24 hour support. His family was not present. At this time he would benefit from skilled therapy depending on the amount of family support that can be provided. He would benefit from further skilled acute therapy as well.      Follow Up Recommendations SNF;Supervision/Assistance - 24 hour    Equipment Recommendations  Rolling walker with 5" wheels    Recommendations for Other Services  (rehab at a SNF v home with family support )     Precautions / Restrictions Precautions Precautions: None Restrictions Weight Bearing Restrictions: No      Mobility  Bed Mobility Overal bed mobility: Needs Assistance Bed Mobility: Sit to Supine       Sit to supine: Min assist   General bed mobility comments: Min a to sit at the edge of the bed. Once to the edge of the bed only required supervision   Transfers Overall transfer level: Needs assistance Equipment used: Rolling walker (2 wheeled) Transfers: Sit to/from Stand Sit to Stand: Min assist         General transfer comment: Min a for balance and strength to stand from the edge of the bed.   Ambulation/Gait Ambulation/Gait assistance: Min assist Ambulation Distance (Feet): 5 Feet Assistive device: Rolling walker (2 wheeled) Gait Pattern/deviations: Shuffle;Narrow base of support (min a for balance. Min cuing ffor direction. )        Stairs            Wheelchair Mobility    Modified Rankin (Stroke Patients Only)       Balance Overall balance assessment: Needs  assistance Sitting-balance support: No upper extremity supported Sitting balance-Leahy Scale: Good     Standing balance support: Bilateral upper extremity supported Standing balance-Leahy Scale: Poor                               Pertinent Vitals/Pain Pain Assessment: No/denies pain    Home Living Family/patient expects to be discharged to:: Private residence Living Arrangements:  (With famaily but questionable historian)               Additional Comments: unable to give  an accurate histroy but per notes patient has had family with him most fo the day.     Prior Function Level of Independence: Needs assistance   Gait / Transfers Assistance Needed: Per patient used a walker but could not walk far.            Hand Dominance   Dominant Hand:  (unable to say )    Extremity/Trunk Assessment   Upper Extremity Assessment: Difficult to assess due to impaired cognition           Lower Extremity Assessment: Difficult to assess due to impaired cognition (Patient could kick his legs out on command )         Communication   Communication: Other (comment)  Cognition Arousal/Alertness: Lethargic Behavior During Therapy: Flat affect Overall Cognitive Status: History of cognitive impairments - at baseline  General Comments      Exercises        Assessment/Plan    PT Assessment Patient needs continued PT services  PT Diagnosis Difficulty walking;Abnormality of gait;Generalized weakness;Acute pain;Altered mental status   PT Problem List Decreased strength;Decreased range of motion;Decreased activity tolerance;Decreased balance;Decreased mobility;Decreased safety awareness  PT Treatment Interventions Gait training;Stair training;Functional mobility training;Therapeutic activities;Therapeutic exercise;Patient/family education;Manual techniques   PT Goals (Current goals can be found in the Care Plan section) Acute Rehab PT  Goals Patient Stated Goal: unable to understand patient goal  PT Goal Formulation: With patient    Frequency Min 3X/week   Barriers to discharge        Co-evaluation               End of Session Equipment Utilized During Treatment: Gait belt Activity Tolerance: Patient limited by lethargy Patient left: in bed;with bed alarm set Nurse Communication: Mobility status         Time: 0235-0300 PT Time Calculation (min) (ACUTE ONLY): 25 min   Charges:   PT Evaluation $PT Eval Moderate Complexity: 1 Procedure     PT G Codes:        Carney Living PT DPT  07/01/2016, 4:04 PM

## 2016-07-01 NOTE — Progress Notes (Signed)
Pt transferred to the unit at 1210. Pt mental status is alert and oriented. Pt oriented to room, staff, and call bell. Skin is intact. Call bell within reach. Pt grandchildren are at the bedside.

## 2016-07-01 NOTE — Evaluation (Signed)
Clinical/Bedside Swallow Evaluation Patient Details  Name: Dustin Leonard MRN: [id number] Date of Birth: Nov 12, 1928  Today's Date: 07/01/2016 Time: SLP Start Time (ACUTE ONLY): 17 SLP Stop Time (ACUTE ONLY): 0930 SLP Time Calculation (min) (ACUTE ONLY): 18 min  Past Medical History:  Past Medical History:  Diagnosis Date  . Asthma   . Prostate cancer ([place])   . Stroke [facility])    Past Surgical History:  Past Surgical History:  Procedure Laterality Date  . PROSTATE SURGERY     HPI:  Mr. Carse is an 82 year old male with past medical history of asthma, prostate cancer, CVA, type II diabetes that presents to the ED with shortness of breath. Wife and son are at the bedside and help provide history. This morning, patient's wife went to get him out of bed but he couldn't get up due to weakness. Per son, his speech is at baseline,since his TIA several years ago.  He spoke to his son on the phone and his breathing was labored and stated he couldn't breath and couldn't get out of bed. In the last 5 years, patient has been doing well without hospitalization.    Assessment / Plan / Recommendation Clinical Impression  Clinical swallowing evaluation was completed using thin liquids via tsp and cup sips, pureed material and dual textured solids.  Oral mechanism exam was completed and  weakness was noted with lingual deviation to the right during movement.  Of note, the patient has had dysarthric speech since a CVA a few years ago that the family reported as baseline.  Previous swallow work up at [facility] was not found.  The patient reported that he gets choked at home on foods which feel like they get stuck.  Nursing reported throat clearing while taking his medications crushed in puree.  Clinically the patient presented with oral and pharyngeal dysphagia characterized by delayed oral transit for purees and dual textured solids and delayed swallow trigger.  Increased SHOB and delayed throat clear were noted  across all textures as well as a slight drop in O2 levels.  Given the current clinical presentation, the patient's history of CVA and complaints of choking on food recommend keeping the patient NPO pending MBS to determine safest oral diet.     Aspiration Risk  Moderate aspiration risk    Diet Recommendation   NPO pending MBS results  Medication Administration: Via alternative means    Other  Recommendations Oral Care Recommendations: Oral care QID     Swallow Study   General Date of Onset: 06/30/16 HPI: Mr. Benko is an 82 year old male with past medical history of asthma, prostate cancer, CVA, type II diabetes that presents to the ED with shortness of breath. Wife and son are at the bedside and help provide history. This morning, patient's wife went to get him out of bed but he couldn't get up due to weakness. Per son, his speech is at baseline,since his TIA several years ago.  He spoke to his son on the phone and his breathing was labored and stated he couldn't breath and couldn't get out of bed. In the last 5 years, patient has been doing well without hospitalization.  Type of Study: Bedside Swallow Evaluation Previous Swallow Assessment: No previous swallow work up noted at [facility].   Diet Prior to this Study: NPO Temperature Spikes Noted: Yes History of Recent Intubation: No Behavior/Cognition: Alert;Cooperative;Requires cueing Oral Cavity Assessment: Dry Oral Care Completed by SLP: No Oral Cavity - Dentition: Dentures, top;Dentures, bottom (Bottom dentures fit very  poorly and were not used during BSE) Vision: Impaired for self-feeding Self-Feeding Abilities: Needs assist Patient Positioning: Upright in bed Baseline Vocal Quality: Hoarse Volitional Cough: Strong Volitional Swallow: Able to elicit    Oral/Motor/Sensory Function Overall Oral Motor/Sensory Function: Mild impairment Facial ROM: Within Functional Limits Facial Symmetry: Within Functional Limits Facial Strength:  Within Functional Limits Lingual ROM: Within Functional Limits Lingual Symmetry: Abnormal symmetry right (deviation to the right noted with movement) Lingual Strength: Reduced Mandible: Within Functional Limits   Ice Chips Ice chips: Not tested   Thin Liquid Thin Liquid: Impaired Presentation: Cup;Spoon Pharyngeal  Phase Impairments: Suspected delayed Swallow;Throat Clearing - Delayed (increased SHOB)    Nectar Thick Nectar Thick Liquid: Not tested   Honey Thick Honey Thick Liquid: Not tested   Puree Puree: Impaired Presentation: Spoon Oral Phase Impairments: Impaired mastication Oral Phase Functional Implications: Prolonged oral transit Pharyngeal Phase Impairments: Suspected delayed Swallow;Throat Clearing - Immediate (increased SHOB)   Solid   GO   Solid: Impaired Presentation: Spoon Oral Phase Impairments: Impaired mastication Oral Phase Functional Implications: Prolonged oral transit Pharyngeal Phase Impairments: Suspected delayed Swallow;Throat Clearing - Delayed (increased SHOB)       Shelly Flatten, MA, CCC-SLP Acute Rehab SLP [phone] Shelly Flatten N 07/01/2016,9:39 AM

## 2016-07-01 NOTE — Progress Notes (Signed)
   Subjective: Patient was evaluated this morning on rounds. Patient continues to have shortness of breath. He denies chest pain, nausea or vomiting, abdominal pain, dysuria or urinary frequency.  Objective:  Vital signs in last 24 hours: Vitals:   07/01/16 0600 07/01/16 0800 07/01/16 0836 07/01/16 1000  BP: 124/62   123/74  Pulse: 82   89  Resp: (!) 22   (!) 22  Temp:  98.9 F (37.2 C)    TempSrc:  Oral    SpO2: 99%  98% 98%  Weight:      Height:       Physical Exam  Constitutional:  Dysarthria - at baseline  Eyes:  Poor vision -chronic  Cardiovascular: Normal rate, regular rhythm and normal heart sounds.  Exam reveals no gallop and no friction rub.   No murmur heard. Pulmonary/Chest: Effort normal.  Wheezes present diffusely. No coarse or fine crackles noted  Abdominal: Soft. There is no tenderness.  Musculoskeletal:  Trace pitting edema  Skin: Skin is warm and dry.     Assessment/Plan:  Principal Problem:   COPD exacerbation ([facility]) Active Problems:   COPD with emphysema ([facility])   PROSTATE CANCER, HX OF   Cardiomyopathy, ischemic   History of TIA (transient ischemic attack)   UTI (lower urinary tract infection)   T2DM (type 2 diabetes mellitus) ([place])  Acute COPD exacerbation Patient has history of COPD (FEV1 61%)  He is currently stating well on room air.  On exam he has diffuse expiratory wheezing and no coarse or fine crackles noted.  Today there is improvement in his lung sounds.  This is likely an exacerbation triggered by a recent URI.  At this point will continue current antibiotic and steroid treatment.   - solumedrol 40mg  IV - Duoneb Q6 - Levofloxacin 750mg  IV  Possible Aspiration pneumonia He is currently afebrile.  Chest X-ray with 2 view was done for better imaging results and showed no pleural effusion or signs of aspiration pneumonia.  OT saw and evaluated patient.  They recommended patient be NPO until modified barium sallow was done later today.  He  was classified as a moderate aspiration risk. -modified barium swallow   -NPO - changed antibiotic and steroid oral medications to IV  UTI Patient denies dysuria or urinary frequency.  Urinalysis suggestive of UTI - Levofloxacin 750mg  IV  Diabetes mellitus type 2 - Continue home Levemir - SSI  History of CVA Stable - Continue home ASA - Continue home Plavix  Dispo: Anticipated discharge in approximately 1 day(s).   Valinda Party, DO 07/01/2016, 11:02 AM Pager: [phone]

## 2016-07-01 NOTE — Progress Notes (Addendum)
Report was given to RN Phineas Semen on unit [place], pt transferred via wheelchair with belongings to room 5W20, nontele. Family, son and 3 grandchildren at pt's side and made aware of new room number and unit.

## 2016-07-01 NOTE — Progress Notes (Signed)
Pt to transfer to med surg bed, attempted report, nurse receiving pt currently in report with another nurse. Will return call.

## 2016-07-01 NOTE — Progress Notes (Signed)
Internal Medicine Attending:   I saw and examined the patient. I reviewed the resident's note and I agree with the resident's findings and plan as documented in the resident's note.  82 year old man responding well to treatment of COPD exacerbation with systemic steroids and antibiotics. Nursing has noted delayed swallowing of foods and meds. Speech exam has noted dysarthria and dysfunctional swallowing that will need a modified barium swallow to further evaluate. We will continue NPO and IV meds for now while pending this workup. I wonder why he is having more swallow difficulties, is this a residual deficit of his prior CVA, or might he have a new ischemic event. If the MBS is abnormal, I would consider MRI brain to rule out a new CVA.

## 2016-07-02 LAB — BASIC METABOLIC PANEL
ANION GAP: 6 (ref 5–15)
BUN: 28 mg/dL — ABNORMAL HIGH (ref 6–20)
CHLORIDE: 110 mmol/L (ref 101–111)
CO2: 25 mmol/L (ref 22–32)
CREATININE: 1.3 mg/dL — AB (ref 0.61–1.24)
Calcium: 8.6 mg/dL — ABNORMAL LOW (ref 8.9–10.3)
GFR calc non Af Amer: 48 mL/min — ABNORMAL LOW (ref >60)
GFR, EST AFRICAN AMERICAN: 56 mL/min — AB (ref >60)
Glucose, Bld: 235 mg/dL — ABNORMAL HIGH (ref 65–99)
Potassium: 4.3 mmol/L (ref 3.5–5.1)
Sodium: 141 mmol/L (ref 135–145)

## 2016-07-02 LAB — CBC
HEMATOCRIT: 39 % (ref 39.0–52.0)
HEMOGLOBIN: 12.4 g/dL — AB (ref 13.0–17.0)
MCH: 29.1 pg (ref 26.0–34.0)
MCHC: 31.8 g/dL (ref 30.0–36.0)
MCV: 91.5 fL (ref 78.0–100.0)
Platelets: 121 10*3/uL — ABNORMAL LOW (ref 150–400)
RBC: 4.26 MIL/uL (ref 4.22–5.81)
RDW: 13.9 % (ref 11.5–15.5)
WBC: 10 10*3/uL (ref 4.0–10.5)

## 2016-07-02 LAB — GLUCOSE, CAPILLARY
GLUCOSE-CAPILLARY: 285 mg/dL — AB (ref 65–99)
Glucose-Capillary: 208 mg/dL — ABNORMAL HIGH (ref 65–99)
Glucose-Capillary: 147 mg/dL — ABNORMAL HIGH (ref 65–99)
Glucose-Capillary: 344 mg/dL — ABNORMAL HIGH (ref 65–99)

## 2016-07-02 MED ORDER — LEVOFLOXACIN 500 MG PO TABS
500.0000 mg | ORAL_TABLET | Freq: Every day | ORAL | Status: DC
  Administered 2016-07-02 – 2016-07-03 (×2): 500 mg via ORAL
  Filled 2016-07-02 (×2): qty 1

## 2016-07-02 MED ORDER — RESOURCE THICKENUP CLEAR PO POWD
ORAL | Status: DC | PRN
  Filled 2016-07-02: qty 125

## 2016-07-02 MED ORDER — SODIUM CHLORIDE 0.9 % IV SOLN
INTRAVENOUS | Status: DC
  Administered 2016-07-02: 08:00:00 via INTRAVENOUS

## 2016-07-02 MED ORDER — IPRATROPIUM-ALBUTEROL 0.5-2.5 (3) MG/3ML IN SOLN
3.0000 mL | Freq: Four times a day (QID) | RESPIRATORY_TRACT | Status: DC
  Administered 2016-07-02 (×2): 3 mL via RESPIRATORY_TRACT
  Filled 2016-07-02 (×2): qty 3

## 2016-07-02 MED ORDER — PREDNISONE 20 MG PO TABS
40.0000 mg | ORAL_TABLET | Freq: Every day | ORAL | Status: DC
  Administered 2016-07-02 – 2016-07-03 (×2): 40 mg via ORAL
  Filled 2016-07-02 (×2): qty 2

## 2016-07-02 MED ORDER — SODIUM CHLORIDE 0.9 % IV SOLN
INTRAVENOUS | Status: DC
Start: 2016-07-02 — End: 2016-07-02

## 2016-07-02 MED ORDER — IPRATROPIUM-ALBUTEROL 0.5-2.5 (3) MG/3ML IN SOLN
3.0000 mL | Freq: Three times a day (TID) | RESPIRATORY_TRACT | Status: DC
  Administered 2016-07-03 (×2): 3 mL via RESPIRATORY_TRACT
  Filled 2016-07-02 (×2): qty 3

## 2016-07-02 NOTE — Progress Notes (Signed)
   Subjective: Patient was seen and examined this morning. He denies shortness of breath or chest pain. He asks for some water.   Objective: Vital signs in last 24 hours: Vitals:   07/01/16 1937 07/01/16 2216 07/02/16 0626 07/02/16 0736  BP:  134/71 140/69   Pulse:  92 83   Resp:  18 18   Temp:  99.4 F (37.4 C) 98.9 F (37.2 C)   TempSrc:  Oral Oral   SpO2: 97% 93% 100% 96%  Weight:      Height:       Physical Exam General: Vital signs reviewed.  Patient is elderly, in no acute distress and cooperative with exam.  Cardiovascular: RRR Pulmonary/Chest: Scattered coarse rales and expiratory wheezes, no increased work of breathing. Abdominal: Soft, non-tender, non-distended, BS + Extremities: No lower extremity edema bilaterally  Assessment/Plan: Principal Problem:   COPD exacerbation (HCC) Active Problems:   COPD with emphysema (HCC)   PROSTATE CANCER, HX OF   Cardiomyopathy, ischemic   History of TIA (transient ischemic attack)   UTI (lower urinary tract infection)   T2DM (type 2 diabetes mellitus) (HCC)  Acute COPD Exacerbation: Patient denies shortness of breath. He continues to have diffuse rhonchi and wheezing on examination. He is satting well on room air. -Prednisone 40 mg po for 5 days (end 9/13) -Duoneb Q6H -Levofloxacin 750 mg po for 5 days (end 9/12)  Aspiration Risk: Given nursing concerns that patient was throat clearning during medication administration, SLP was consulted. Patient was seen yesterday morning at 0900 and recommendations were to keep patient NPO for MBS. MBS appears to have been done at 1245; however, no results are back and no further SLP recommendations have been made. I have contacted SLP in order to identify an appropriate diet for this patient as he has remained NPO overnight and on NS 75 cc/hr.  -Results of MBS pending -Awaiting SLP recommendations -NS 75 cc/hr while NPO  UTI: UA concerning for UTI with large leukocytes and too numerous  to count WBC and many bacteria. Patient denies urinary complaints.  -Levofloxacin 750 mg po for 5 days (end 9/12)  T2DM -Continue home Levemir 20 units daily -SSI  History of CVA: Per family's history patient is at baseline speech and they have not noted any new generalized or focal weakness. However, given possible change in swallowing mechanics, we will order an MRI brain to rule acute CVA if MBS is abnormal. Patient evaluated by PT and recommending SNF versus 24 hour home care. -Consult to social work for SNF placement -MRI if MBS abnormal -Continue home ASA -Continue home Plavix  DVT/PE ppx: Lovenox 40 mg SQ QD FEN: NPO, NS 75 cc/hr CODE: FULL  Dispo: Anticipated discharge in approximately 1 day(s).   LOS: 2 days   Martyn Malay, DO PGY-3 Internal Medicine Resident Pager # [phone] 07/02/2016 8:50 AM

## 2016-07-02 NOTE — Progress Notes (Signed)
I spoke with Dr. Quay Burow about patient receiving PO meds with sips of water. She said to go ahead and give PO meds with minimal water. Elias Else D

## 2016-07-02 NOTE — Progress Notes (Signed)
Internal Medicine Attending:   I saw and examined the patient. I reviewed the resident's note and I agree with the resident's findings and plan as documented in the resident's note.  82 year old man admitted for acute COPD exacerbation. On exam this morning he continues to look frail and chronically ill. He is lying in bed sleeping. He still has significant expiratory wheezing bilaterally. We had some delays in getting his systemic steroids started because of swallowing dysfunction. Today will be day 2 of systemic steroids and day 3 of antibiotics. Please also continue nebulized bronchodilators. It seems like he had a modified barium swallow yesterday which has not resulted back yet. This morning at the bedside and told me he was thirsty. I gave him some water through a straw which she swallowed without coughing or difficulty. I wonder if the slow oral transit seen by the nursing might be more related to dementia rather than mechanical dysfunction. We will follow-up with speech today and likely resume oral diet. I think oral medications with water is safe to give now. Disposition will likely be to skilled nursing facility in 1-2 days if we can get his COPD under better control.

## 2016-07-02 NOTE — Progress Notes (Signed)
Speech Language Pathology Treatment:    Patient Details Name: Dustin Leonard MRN: [id number] DOB: [birth year] Today's Date: 07/02/2016 Time:  -     Assessment / Plan / Recommendation Clinical Impression  MBS completed 07/01/16 By Shelly Flatten, but report is not showing under imaging. Melissa's note recommended the patient have a diet of Dys2, Honey thick liquids. Nurse contacted me today for diet recommendation and MBS results. Nurse will enter diet order.      HPI        SLP Plan: Diet tolerance and swallowing therapy.        Recommendations: Dys 2, Honey thick liquids.                       [place], MA, CCC-SLP 07/02/2016 1:15 PM

## 2016-07-02 NOTE — Evaluation (Signed)
Occupational Therapy Evaluation Patient Details Name: Dustin Leonard MRN: [id number] DOB: September 11, 1929 Today's Date: 07/02/2016    History of Present Illness Patient presents with COPD and facial drop. Past medical history includes past stroke and prostate cancer.     Clinical Impression   Pt reports he was independent with ADL PTA. Currently pt overall min assist for ADL and functional mobility with the exception of mod assist for LB ADL. Pt presenting with poor activity tolerance and endurance, increased SOB with activity, and pt reports that "I cant move my legs" impacting his independence and safety with ADL and functional mobility. SpO2 down to 93 on RA with activity; returned to 95 on RA at rest with audible SOB/wheezing throughout (RN aware). Recommending SNF for further rehab prior to return home to maximize independence and safety with ADL and functional mobility. Pt may progress to home with 24/7 supervision and [place] for follow up; will continue to assess. Pt would benefit from continued skilled OT to address established goals.    Follow Up Recommendations  SNF;Supervision/Assistance - 24 hour (may progress to home with HHOT)    Equipment Recommendations  3 in 1 bedside comode (3 in 1 in tub vs tub bench?)    Recommendations for Other Services       Precautions / Restrictions Precautions Precautions: None Restrictions Weight Bearing Restrictions: No      Mobility Bed Mobility Overal bed mobility: Needs Assistance Bed Mobility: Supine to Sit;Sit to Supine     Supine to sit: Min guard;HOB elevated Sit to supine: Min assist   General bed mobility comments: Min assist for LEs back to bed. Min guard for safety with cues for technique and scooting hips out to EOB.  Transfers Overall transfer level: Needs assistance Equipment used: Rolling walker (2 wheeled) Transfers: Sit to/from Stand Sit to Stand: Min assist         General transfer comment: Min assist to boost  up from EOB and for balance in standing. Good hand placement with RW.    Balance Overall balance assessment: Needs assistance Sitting-balance support: Feet supported;No upper extremity supported Sitting balance-Leahy Scale: Good     Standing balance support: Bilateral upper extremity supported Standing balance-Leahy Scale: Poor Standing balance comment: RW for support                            ADL Overall ADL's : Needs assistance/impaired Eating/Feeding: Set up;Supervision/ safety;Bed level   Grooming: Set up;Min guard;Sitting   Upper Body Bathing: Set up;Min guard;Sitting   Lower Body Bathing: Moderate assistance;Sit to/from stand   Upper Body Dressing : Set up;Supervision/safety;Sitting   Lower Body Dressing: Moderate assistance;Sit to/from stand   Toilet Transfer: Minimal assistance;Ambulation;BSC;RW Toilet Transfer Details (indicate cue type and reason): Simulated by sit to stand from EOB with functional mobility in room. Toileting- Clothing Manipulation and Hygiene: Maximal assistance;Sit to/from stand       Functional mobility during ADLs: Minimal assistance;Rolling walker General ADL Comments: SpO2 down to 93 on RA following activity; educated pt on breathing strategies. SpO2 up to 95 on RA at end of session (RN aware). Audible SOB noted with limited mobility.     Vision Additional Comments: Pt with obvious visual deficits; running into objects during functional mobility and requiring cueing for safety. Pt would benefit from further visual testing.   Perception     Praxis      Pertinent Vitals/Pain Pain Assessment: No/denies pain     Hand  Dominance Right   Extremity/Trunk Assessment Upper Extremity Assessment Upper Extremity Assessment: Overall WFL for tasks assessed   Lower Extremity Assessment Lower Extremity Assessment: Defer to PT evaluation   Cervical / Trunk Assessment Cervical / Trunk Assessment: Kyphotic   Communication  Communication Communication: No difficulties   Cognition Arousal/Alertness: Awake/alert Behavior During Therapy: Flat affect Overall Cognitive Status: History of cognitive impairments - at baseline                     General Comments       Exercises       Shoulder Instructions      Home Living Family/patient expects to be discharged to:: Private residence Living Arrangements: Spouse/significant other Available Help at Discharge: Family;Available 24 hours/day Type of Home: House       Home Layout: Two level;Able to live on main level with bedroom/bathroom;Laundry or work area in basement     [employer] Shower/Tub: Tub/shower unit [employer] characteristics: Architectural technologist: [place]: Environmental consultant - 2 wheels          Prior Functioning/Environment Level of Independence: Independent with assistive device(s)  Gait / Transfers Assistance Needed: RW for mobility PTA          OT Diagnosis: Generalized weakness;Blindness and low vision   OT Problem List: Decreased strength;Decreased activity tolerance;Impaired balance (sitting and/or standing);Decreased knowledge of use of DME or AE;Decreased knowledge of precautions   OT Treatment/Interventions: Self-care/ADL training;Neuromuscular education;Energy conservation;DME and/or AE instruction;Therapeutic activities;Patient/family education;Balance training    OT Goals(Current goals can be found in the care plan section) Acute Rehab OT Goals Patient Stated Goal: return home OT Goal Formulation: With patient/family Time For Goal Achievement: 07/16/16 Potential to Achieve Goals: Good ADL Goals Pt Will Perform Grooming: with supervision;standing Pt Will Perform Upper Body Bathing: with supervision;sitting Pt Will Perform Lower Body Bathing: with supervision;sit to/from stand Pt Will Transfer to Toilet: with supervision;ambulating;bedside commode Pt Will Perform Toileting - Clothing Manipulation  and hygiene: with supervision;sit to/from stand Pt Will Perform Tub/Shower Transfer: Tub transfer;with supervision;ambulating;rolling walker (3 in 1 vs tub bench) Additional ADL Goal #1: Pt will independently verbally recall 3 energy conservation strategies.  OT Frequency: Min 2X/week   Barriers to D/C:            Co-evaluation              End of Session Equipment Utilized During Treatment: Gait belt;Rolling walker Nurse Communication: Mobility status;Other (comment) (SpO2 down to 93 on RA)  Activity Tolerance: Patient tolerated treatment well Patient left: in bed;with call bell/phone within reach;with bed alarm set;with family/visitor present;with nursing/sitter in room   Time: [id number] OT Time Calculation (min): 23 min Charges:  OT General Charges $OT Visit: 1 Procedure OT Evaluation $OT Eval Moderate Complexity: 1 Procedure OT Treatments $Therapeutic Activity: 8-22 mins G-Codes:     Binnie Kand M.S., OTR/L Pager: [phone]  07/02/2016, 4:45 PM

## 2016-07-03 ENCOUNTER — Other Ambulatory Visit: Payer: Self-pay | Admitting: Internal Medicine

## 2016-07-03 DIAGNOSIS — E118 Type 2 diabetes mellitus with unspecified complications: Secondary | ICD-10-CM

## 2016-07-03 LAB — URINE CULTURE

## 2016-07-03 LAB — GLUCOSE, CAPILLARY
Glucose-Capillary: 162 mg/dL — ABNORMAL HIGH (ref 65–99)
Glucose-Capillary: 171 mg/dL — ABNORMAL HIGH (ref 65–99)

## 2016-07-03 MED ORDER — INSULIN DETEMIR 100 UNIT/ML ~~LOC~~ SOLN: 20.0000 [IU] | Freq: Every day | SUBCUTANEOUS | Status: DC

## 2016-07-03 MED ORDER — PREDNISONE 20 MG PO TABS: 40.0000 mg | ORAL_TABLET | Freq: Every day | ORAL | 0 refills | Status: DC

## 2016-07-03 MED ORDER — LEVOFLOXACIN 500 MG PO TABS: 500.0000 mg | ORAL_TABLET | Freq: Every day | ORAL | 0 refills | Status: DC

## 2016-07-03 MED ORDER — IPRATROPIUM-ALBUTEROL 0.5-2.5 (3) MG/3ML IN SOLN: 3.0000 mL | Freq: Four times a day (QID) | RESPIRATORY_TRACT | 1 refills | Status: DC | PRN

## 2016-07-03 NOTE — Progress Notes (Signed)
Received called from [facility], to clarify if levaquin prescription of 1 tab.  Paged Dr. Heber [place] to clarify.  Will await for return call and continue to monitor.  Alphonzo Lemmings, RN

## 2016-07-03 NOTE — Clinical Social Work Placement (Signed)
   CLINICAL SOCIAL WORK PLACEMENT  NOTE  Date:  07/03/2016  Patient Details  Name: Adeoluwa Kessner MRN: [id number] Date of Birth: 1929/06/08  Clinical Social Work is seeking post-discharge placement for this patient at the [place] level of care (*CSW will initial, date and re-position this form in  chart as items are completed):      Patient/family provided with [place] Work Department's list of facilities offering this level of care within the geographic area requested by the patient (or if unable, by the patient's family).      Patient/family informed of their freedom to choose among providers that offer the needed level of care, that participate in Medicare, Medicaid or managed care program needed by the patient, have an available bed and are willing to accept the patient.      Patient/family informed of [place]'s ownership interest in [facility] and [facility], as well as of the fact that they are under no obligation to receive care at these facilities.  PASRR submitted to EDS on 07/03/16     PASRR number received on 07/03/16     Existing PASRR number confirmed on       FL2 transmitted to all facilities in geographic area requested by pt/family on 07/03/16     FL2 transmitted to all facilities within larger geographic area on       Patient informed that his/her managed care company has contracts with or will negotiate with certain facilities, including the following:            Patient/family informed of bed offers received.  Patient chooses bed at       Physician recommends and patient chooses bed at      Patient to be transferred to   on  .  Patient to be transferred to facility by       Patient family notified on   of transfer.  Name of family member notified:        PHYSICIAN Please sign FL2     Additional Comment:    _______________________________________________ Benard Halsted, [place] 07/03/2016, 10:31  AM

## 2016-07-03 NOTE — Progress Notes (Signed)
Speech Language Pathology Treatment: Dysphagia  Patient Details Name: Okie Jeffords MRN: [id number] DOB: 21-Feb-1929 Today's Date: 07/03/2016 Time: [id number] SLP Time Calculation (min) (ACUTE ONLY): 19 min  Assessment / Plan / Recommendation Clinical Impression  Pt seen with lunch, alone, struggling to feed himself due to visual impairment. Thickened water at bedside, though texture not quite honey thick. Pt is observed to have significant hard coughing after sips of honey thick liquids, reduced with teaspoon sized sips. Given that pt is not having improvement in coughing with honey thick liquids and no pna was dx, would encourage diet advancement with precautions at next level of care. Discussed need for full supervision with RN.    HPI HPI: Mr. Felber is an 82 year old male with past medical history of asthma, prostate cancer, CVA, type II diabetes that presents to the ED with shortness of breath. Wife and son are at the bedside and help provide history. This morning, patient's wife went to get him out of bed but he couldn't get up due to weakness. Per son, his speech is at baseline,since his TIA several years ago.  He spoke to his son on the phone and his breathing was labored and stated he couldn't breath and couldn't get out of bed. In the last 5 years, patient has been doing well without hospitalization.       SLP Plan  Continue with current plan of care     Recommendations  Diet recommendations: Dysphagia 2 (fine chop);Honey-thick liquid Liquids provided via: Teaspoon Medication Administration: Crushed with puree Supervision: Staff to assist with self feeding;Full supervision/cueing for compensatory strategies Compensations: Slow rate;Small sips/bites Postural Changes and/or Swallow Maneuvers: Seated upright 90 degrees             Oral Care Recommendations: Oral care BID Follow up Recommendations: Skilled Nursing facility Plan: Continue with current plan of care      GO                Azavier Creson, Katherene Ponto 07/03/2016, 2:10 PM

## 2016-07-03 NOTE — Clinical Social Work Placement (Signed)
   CLINICAL SOCIAL WORK PLACEMENT  NOTE  Date:  07/03/2016  Patient Details  Name: Dustin Leonard MRN: [id number] Date of Birth: 30-Aug-1929  Clinical Social Work is seeking post-discharge placement for this patient at the [place] level of care (*CSW will initial, date and re-position this form in  chart as items are completed):  Yes   Patient/family provided with [place] Work Department's list of facilities offering this level of care within the geographic area requested by the patient (or if unable, by the patient's family).  Yes   Patient/family informed of their freedom to choose among providers that offer the needed level of care, that participate in Medicare, Medicaid or managed care program needed by the patient, have an available bed and are willing to accept the patient.  Yes   Patient/family informed of [place]'s ownership interest in [facility] and [facility], as well as of the fact that they are under no obligation to receive care at these facilities.  PASRR submitted to EDS on 07/03/16     PASRR number received on 07/03/16     Existing PASRR number confirmed on       FL2 transmitted to all facilities in geographic area requested by pt/family on 07/03/16     FL2 transmitted to all facilities within larger geographic area on       Patient informed that his/her managed care company has contracts with or will negotiate with certain facilities, including the following:        Yes   Patient/family informed of bed offers received.  Patient [facility] bed [facility]     Physician recommends and patient chooses bed at      Patient to be transferred to [facility] on 07/03/16.  Patient to be transferred to facility by [facility]     Patient family notified on 07/03/16 of transfer.  Name of family member notified:  [name]     PHYSICIAN Please sign FL2     Additional Comment:     _______________________________________________ Benard Halsted, [place] 07/03/2016, 4:21 PM

## 2016-07-03 NOTE — Progress Notes (Signed)
Neita Garnet discharged Skilled nursing facility per MD order.  Report called to receiving Misty, LPN at [facility].     Medication List    STOP taking these medications   clarithromycin 500 MG tablet Commonly known as:  BIAXIN     TAKE these medications   albuterol (2.5 MG/3ML) 0.083% nebulizer solution Commonly known as:  PROVENTIL Take 3 mLs (2.5 mg total) by nebulization every 6 (six) hours as needed for wheezing or shortness of breath.   albuterol 108 (90 Base) MCG/ACT inhaler Commonly known as:  PROVENTIL HFA;VENTOLIN HFA Inhale 2 puffs into the lungs every 6 (six) hours as needed for wheezing or shortness of breath.   aspirin 81 MG chewable tablet Chew 81 mg by mouth daily.   carvedilol 3.125 MG tablet Commonly known as:  COREG Take 3.125 mg by mouth 2 (two) times daily with a meal.   Cholecalciferol 1000 units tablet Take 1,000 Units by mouth daily.   clopidogrel 75 MG tablet Commonly known as:  PLAVIX Take 75 mg by mouth daily.   furosemide 40 MG tablet Commonly known as:  LASIX Take 1 tablet by mouth daily.   ipratropium-albuterol 0.5-2.5 (3) MG/3ML Soln Commonly known as:  DUONEB Take 3 mLs by nebulization every 6 (six) hours as needed.   LEVEMIR 100 UNIT/ML injection Generic drug:  insulin detemir Inject 47 Units into the skin daily as needed (takes only as needed).   levofloxacin 500 MG tablet Commonly known as:  LEVAQUIN Take 1 tablet (500 mg total) by mouth daily.   predniSONE 20 MG tablet Commonly known as:  DELTASONE Take 2 tablets (40 mg total) by mouth daily with breakfast.   ramipril 10 MG capsule Commonly known as:  ALTACE Take 10 mg by mouth daily.       Patients skin is clean, dry and intact, no evidence of skin break down. IV site discontinued and catheter remains intact. Site without signs and symptoms of complications. Dressing and pressure applied.  Patient transported on a stretcher by non emergent EMS,  no distress noted  upon discharge.  Wynetta Emery, Makila Colombe C 07/03/2016 5:45 PM

## 2016-07-03 NOTE — NC FL2 (Signed)
[place] MEDICAID FL2 LEVEL OF CARE SCREENING TOOL     IDENTIFICATION  Patient Name: Dustin Leonard Birthdate: 12-01-1928 Sex: male Admission Date (Current Location): 06/30/2016  [facility] and [place] Number:  Herbalist and Address:  [facility] [place], [place] [address] [place], [place] [address]      Provider Number: [id number]  Attending Physician Name and Address:  Axel Filler, MD  Relative Name and Phone Number:  Florentina Addison, spouse, [phone]    Current Level of Care: Hospital Recommended Level of Care: [place] Prior Approval Number:    Date Approved/Denied:   PASRR Number: [id number]  Discharge Plan: SNF    Current Diagnoses: Patient Active Problem List   Diagnosis Date Noted  . COPD exacerbation ([place]) 06/30/2016  . History of TIA (transient ischemic attack) 06/30/2016  . UTI (lower urinary tract infection) 06/30/2016  . T2DM (type 2 diabetes mellitus) ([place]) 06/30/2016  . Stroke ([place]) 09/22/2014  . Cardiomyopathy, ischemic 04/13/2013  . GLAUCOMA 12/02/2007  . COPD with emphysema ([place]) 12/02/2007  . PROSTATE CANCER, HX OF 12/02/2007    Orientation RESPIRATION BLADDER Height & Weight     Self, Time, Situation, Place  Normal Incontinent, Indwelling catheter (Urinary catheter) Weight: 85.9 kg (189 lb 4.8 oz) Height:  5\' 9"  (175.3 cm) (per family)  BEHAVIORAL SYMPTOMS/MOOD NEUROLOGICAL BOWEL NUTRITION STATUS      Continent Diet (Please see DC Summary)  AMBULATORY STATUS COMMUNICATION OF NEEDS Skin   Extensive Assist Verbally Normal                       Personal Care Assistance Level of Assistance  Bathing, Feeding, Dressing Bathing Assistance: Maximum assistance Feeding assistance: Independent Dressing Assistance: Limited assistance     Functional Limitations Info             SPECIAL CARE FACTORS FREQUENCY  PT (By licensed PT), OT (By licensed OT)     PT Frequency: 5x/week OT Frequency:  2x/week            Contractures      Additional Factors Info  Code Status, Allergies, Insulin Sliding Scale Code Status Info: Full Allergies Info: NKA   Insulin Sliding Scale Info: insulin aspart (novoLOG) injection 0-15 Units;insulin aspart (novoLOG) injection 0-5 Units;       Current Medications (07/03/2016):  This is the current hospital active medication list Current Facility-Administered Medications  Medication Dose Route Frequency Provider Last Rate Last Dose  . acetaminophen (TYLENOL) tablet 650 mg  650 mg Oral Q6H PRN Alexa Angela Burke, MD       Or  . acetaminophen (TYLENOL) suppository 650 mg  650 mg Rectal Q6H PRN Alexa Angela Burke, MD      . aspirin chewable tablet 81 mg  81 mg Oral Daily Alexa R Quay Burow, MD   81 mg at 07/02/16 0941  . carvedilol (COREG) tablet 3.125 mg  3.125 mg Oral BID WC Alexa R Quay Burow, MD   3.125 mg at 07/03/16 0915  . clopidogrel (PLAVIX) tablet 75 mg  75 mg Oral Daily Alexa Angela Burke, MD   75 mg at 07/02/16 0941  . enoxaparin (LOVENOX) injection 40 mg  40 mg Subcutaneous Q24H Alexa R Burns, MD   40 mg at 07/02/16 1957  . furosemide (LASIX) tablet 40 mg  40 mg Oral Daily Alexa Angela Burke, MD   40 mg at 07/02/16 0941  . guaiFENesin (MUCINEX) 12 hr tablet 600 mg  600 mg  Oral BID Florinda Marker, MD   600 mg at 07/02/16 0941  . insulin aspart (novoLOG) injection 0-15 Units  0-15 Units Subcutaneous TID WC Alexa Angela Burke, MD   3 Units at 07/03/16 0911  . insulin aspart (novoLOG) injection 0-5 Units  0-5 Units Subcutaneous QHS Florinda Marker, MD   3 Units at 07/02/16 2252  . insulin detemir (LEVEMIR) injection 20 Units  20 Units Subcutaneous Daily Florinda Marker, MD   20 Units at 07/02/16 0942  . ipratropium-albuterol (DUONEB) 0.5-2.5 (3) MG/3ML nebulizer solution 3 mL  3 mL Nebulization TID Axel Filler, MD   3 mL at 07/03/16 0723  . levofloxacin (LEVAQUIN) tablet 500 mg  500 mg Oral Daily Alexa Angela Burke, MD   500 mg at 07/02/16 1759  . ondansetron (ZOFRAN) injection  4 mg  4 mg Intravenous Q8H PRN Alphonzo Grieve, MD   4 mg at 07/01/16 2335   Or  . ondansetron (ZOFRAN) tablet 4 mg  4 mg Oral Q8H PRN Alphonzo Grieve, MD      . predniSONE (DELTASONE) tablet 40 mg  40 mg Oral Q breakfast Alexa Angela Burke, MD   40 mg at 07/03/16 0912  . ramipril (ALTACE) capsule 10 mg  10 mg Oral Daily Alexa Angela Burke, MD   10 mg at 07/02/16 0941     Discharge Medications: Please see discharge summary for a list of discharge medications.  Relevant Imaging Results:  Relevant Lab Results:   Additional Information SSN: [place], [place]

## 2016-07-03 NOTE — Clinical Social Work Note (Signed)
Clinical Social Work Assessment  Patient Details  Name: Dustin Leonard MRN: [id number] Date of Birth: May 21, 1929  Date of referral:  07/03/16               Reason for consult:  Facility Placement                Permission sought to share information with:  Facility Sport and exercise psychologist, Family Supports Permission granted to share information::  Yes, Verbal Permission Granted  Name::     [name]::  SNFs  Relationship::  Spouse  Contact Information:  [phone]  Housing/Transportation Living arrangements for the past 2 months:  Apartment Source of Information:  Patient, Adult Children, Spouse Patient Interpreter Needed:  None Criminal Activity/Legal Involvement Pertinent to Current Situation/Hospitalization:  No - Comment as needed Significant Relationships:  Adult Children, Spouse Lives with:  Spouse Do you feel safe going back to the place where you live?  Yes Need for family participation in patient care:  Yes (Comment)  Care giving concerns:  CSW received consult for possible SNF placement at time of discharge. CSW met with patient and spoke with patient's spouse and daughter regarding PT recommendation of SNF placement at time of discharge. Per patient's spouse, patient's spouse is currently unable to care for patient at their home given patient's current physical needs and fall risk. Patient and patient's spouse expressed understanding of PT recommendation and may agreeable to SNF placement at time of discharge if home health is too expensive. CSW to continue to follow and assist with discharge planning needs.   Social Worker assessment / plan:  CSW spoke with patient and patient's spouse concerning possibility of rehab at [facility] before returning home.  Employment status:  Retired Nurse, adult PT Recommendations:  [place] / Referral to community resources:  [place]  Patient/Family's Response to  care:  Patient and patient's spouse recognize need for rehab before returning home and may be agreeable to a SNF in [place] if home health is not free. Patient's spouse states they do not have the money to pay for services.  Patient/Family's Understanding of and Emotional Response to Diagnosis, Current Treatment, and Prognosis:  Patient/family is realistic regarding therapy needs and expressed being hopeful for SNF placement if home health services are not free. Patient expressed understanding of CSW role and discharge process. No questions/concerns about plan or treatment.    Emotional Assessment Appearance:  Appears stated age Attitude/Demeanor/Rapport:  Other (Appropriate) Affect (typically observed):  Appropriate Orientation:  Oriented to Self, Oriented to Place, Oriented to  Time, Oriented to Situation Alcohol / Substance use:  Not Applicable Psych involvement (Current and /or in the community):  No (Comment)  Discharge Needs  Concerns to be addressed:  Care Coordination Readmission within the last 30 days:  No Current discharge risk:  None Barriers to Discharge:  Continued Medical Work up   [employer], [place] 07/03/2016, 10:35 AM

## 2016-07-03 NOTE — Care Management Important Message (Signed)
Important Message  Patient Details  Name: Dustin Leonard MRN: [id number] Date of Birth: [birth year]   Medicare Important Message Given:  Yes    Marlyss Cissell Montine Circle 07/03/2016, 2:31 PM

## 2016-07-03 NOTE — Progress Notes (Signed)
   Subjective: Patient was evaluated this morning on rounds. He is alert and speaking in complete sentences. He does not require oxygen. He states that he still feels a little short of breath.  Objective:  Vital signs in last 24 hours: Vitals:   07/02/16 2245 07/03/16 0634 07/03/16 0723 07/03/16 0909  BP: (!) 126/57 (!) 153/72  (!) 152/75  Pulse: 85 77  83  Resp: 16 17    Temp: 98.8 F (37.1 C) 98.4 F (36.9 C)    TempSrc: Oral Oral    SpO2: 98% 97% 97% 98%  Weight:      Height:       Physical Exam  Constitutional:  Dysarthria - at baseline  Eyes:  Poor vision -chronic  Cardiovascular: Normal rate, regular rhythm and normal heart sounds.  Exam reveals no gallop and no friction rub.  No murmur heard. Pulmonary/Chest: Effort normal.  Coarse crackles noted diffusely.  No wheezes or rales noted. Abdominal: Soft. There is no tenderness.  Musculoskeletal:  Trace pitting edema  Skin: Skin is warm and dry.   Assessment/Plan:  Principal Problem:   COPD exacerbation (HCC) Active Problems:   COPD with emphysema (HCC)   PROSTATE CANCER, HX OF   Cardiomyopathy, ischemic   History of TIA (transient ischemic attack)   UTI (lower urinary tract infection)   T2DM (type 2 diabetes mellitus) ([place])  COPD exacerbation On physical exam patient no longer has end expiratory wheezing but still has some has coarse crackles diffusely. His vitals are stable and his oxygen saturation is 98% on room air.  Today is day 3 of prednisone 40 mg (end 9/13) and day 4 of levofloxacin 750 mg (end 9/12). - Discharge to SNF - finish levofloxacin - finish prednisone - duoneb Q6H  Dysphasia Speech evaluated patient and suggested a modified barium swallow study.  They recommend the patient have a diet of dysphagia 2, honey thick liquids.  - dysphagia 2 diet  T2DM -Continue home Levemir 20 units daily -SSI  History of CVA No new generalized or focal weakness. Patient's speech is at baseline. Patient  evaluated by PT and recommending SNF versus 24 hour home care. -Continue home ASA -Continue home Plavix  Dispo: Anticipated discharge today to SNF.   Valinda Party, DO 07/03/2016, 11:26 AM Pager: [phone]

## 2016-07-03 NOTE — Care Management Note (Signed)
Case Management Note  Patient Details  Name: Dustin Leonard MRN: [id number] Date of Birth: 07/02/1929  Subjective/Objective:              Pt admitted with COPD exacerbation.  DC to SNF.      Action/Plan:   Expected Discharge Date:                  Expected Discharge Plan:  Skilled Nursing Facility  In-House Referral:  Clinical Social Work  Discharge planning Services  CM Consult  Post Acute Care Choice:  NA Choice offered to:  NA  DME Arranged:  N/A DME Agency:  NA  HH Arranged:  NA HH Agency:  NA  Status of Service:  Completed, signed off  If discussed at [place] of Stay Meetings, dates discussed:    Additional Comments:  Carles Collet, RN 07/03/2016, 1:25 PM

## 2016-07-03 NOTE — Plan of Care (Signed)
Problem: Health Behavior/Discharge Planning: Goal: Ability to manage health-related needs will improve Outcome: Completed/Met Date Met: 07/03/16 Pt. Will be discharging to [facility] today  Problem: Activity: Goal: Risk for activity intolerance will decrease Outcome: Completed/Met Date Met: 07/03/16 Met at this level, pt. Will be transferred to [facility] for continued rehab.

## 2016-07-03 NOTE — Progress Notes (Signed)
Patient will DC to: [facility] Anticipated DC date: 07/03/16 Family notified: Daughter Transport by: Corey Harold    Per MD patient ready for DC to [facility]. RN, patient, patient's family, and facility notified of DC. Discharge Summary sent to facility. RN given number for report. DC packet on chart. Ambulance transport requested for patient.   CSW signing off.  Cedric Fishman, [place] Social Worker [phone]

## 2016-07-03 NOTE — Discharge Summary (Signed)
Name: Dustin Leonard MRN: [id number] DOB: 18-Sep-1929 82 y.o. PCP: Katherina Mires, MD  Date of Admission: 06/30/2016  1:19 PM Date of Discharge: 07/03/2016 Attending Physician: Axel Filler, MD  Discharge Diagnosis: COPD exacerbation  1. Principal Problem:   COPD exacerbation ([place]) Active Problems:   COPD with emphysema (HCC)   PROSTATE CANCER, HX OF   Cardiomyopathy, ischemic   History of TIA (transient ischemic attack)   UTI (lower urinary tract infection)   T2DM (type 2 diabetes mellitus) ([place])   Discharge Medications:   Medication List    STOP taking these medications   clarithromycin 500 MG tablet Commonly known as:  BIAXIN     TAKE these medications   albuterol (2.5 MG/3ML) 0.083% nebulizer solution Commonly known as:  PROVENTIL Take 3 mLs (2.5 mg total) by nebulization every 6 (six) hours as needed for wheezing or shortness of breath.   albuterol 108 (90 Base) MCG/ACT inhaler Commonly known as:  PROVENTIL HFA;VENTOLIN HFA Inhale 2 puffs into the lungs every 6 (six) hours as needed for wheezing or shortness of breath.   aspirin 81 MG chewable tablet Chew 81 mg by mouth daily.   carvedilol 3.125 MG tablet Commonly known as:  COREG Take 3.125 mg by mouth 2 (two) times daily with a meal.   Cholecalciferol 1000 units tablet Take 1,000 Units by mouth daily.   clopidogrel 75 MG tablet Commonly known as:  PLAVIX Take 75 mg by mouth daily.   furosemide 40 MG tablet Commonly known as:  LASIX Take 1 tablet by mouth daily.   ipratropium-albuterol 0.5-2.5 (3) MG/3ML Soln Commonly known as:  DUONEB Take 3 mLs by nebulization every 6 (six) hours as needed.   LEVEMIR 100 UNIT/ML injection Generic drug:  insulin detemir Inject 47 Units into the skin daily as needed (takes only as needed).   levofloxacin 500 MG tablet Commonly known as:  LEVAQUIN Take 1 tablet (500 mg total) by mouth daily.   predniSONE 20 MG tablet Commonly known as:   DELTASONE Take 2 tablets (40 mg total) by mouth daily with breakfast.   ramipril 10 MG capsule Commonly known as:  ALTACE Take 10 mg by mouth daily.       Disposition and follow-up:   DustinDustin Leonard was discharged from [facility] in stable condition.  At the hospital follow up visit please address:  1.  Finish prednisone and levofloxacin course  2.  Labs / imaging needed at time of follow-up: none  3.  Pending labs/ test needing follow-up: Modified Barium Swallow  Follow-up Appointments:   Hospital Course by problem list: Principal Problem:   COPD exacerbation ([place]) Active Problems:   COPD with emphysema ([place])   PROSTATE CANCER, HX OF   Cardiomyopathy, ischemic   History of TIA (transient ischemic attack)   UTI (lower urinary tract infection)   T2DM (type 2 diabetes mellitus) ([place])   COPD exacerbation Dustin Leonard presented to the ED with shortness of breath. On the day of admission he woke up and felt weak and short of breath. He is usually able to get up, have breakfast and sit outside but could not do so that morning. He also states he has had a productive cough recently. He has a history of COPD (FEV1 61%) and on physical exam had coarse crackles and end expiratory wheezing diffusely. On admission vitals were stable and oxygen saturation was 98% on room air.  Vitals remained stable throughout admission and patient never required oxygen.  It is likely that his COPD exacerbation was triggered from a recent upper respiratory infection. He was treated with prednisone, DuoNeb's, levofloxacin. His end date for his prednisone 40 mg is 9/13 (total of 5 days) and his end date for his levofloxacin 750 mg PO is on 9/12  (5 days)   Dysphasia Nursing staff noted that patient was having difficulty swallowing his oral medications and patient had some delay in getting his systemic steroids. Speech evaluated patient and suggested a modified barium swallow study.  They  recommend the patient have a diet of dysphagia 2, honey thick liquids. During this time patient was given IV medications and later switched to oral.      UTI On admission urinalysis showed large leukocytes and too numerous to count WBC suggestive of urinary tract infection.  Patient denied dysuria or urinary frequency upon admission or throughout stay. Levofloxacin 750mg  IV was started and then switched to oral.  Diabetes mellitus type 2 Glucose (311-162) during admission and home Levemir and SSI was started.  History of CVA  There was a concern for facial droop by EMS and code stroke was called but was then canceled.  Per family's history patient was at baseline speech and did not note any generalized or focal weakness upon admission. Son was at the bedside and stated that the only change in his father was his shortness of breath. We continued his home aspirin and Plavix.  Discharge Vitals:   BP (!) 152/75 (BP Location: Left Arm)   Pulse 83   Temp 98.4 F (36.9 C) (Oral)   Resp 17   Ht 5\' 9"  (1.753 m) Comment: per family  Wt 189 lb 4.8 oz (85.9 kg)   SpO2 98%   BMI 27.95 kg/m   Pertinent Labs, Studies, and Procedures:  Chest x-ray FINDINGS: Stable cardiomediastinal silhouette. Atherosclerosis thoracic aorta is noted. No pneumothorax or pleural effusion is noted. No acute pulmonary disease is noted. Bony thorax is unremarkable.  IMPRESSION: No active cardiopulmonary disease.  Aortic atherosclerosis.  CT of Head wo contrast IMPRESSION: 1. No definite acute intracranial abnormality. MRI follow-up may be obtained as indicated given clinical history. 2. Old infarct involving the left basal ganglia and left periventricular white matter. 3. Interval increase in cerebral volume loss 4. Mild sinus disease  Discharge Instructions: Discharge Instructions    AMB referral to pulmonary rehabilitation    Complete by:  As directed   Please select a program:  Pulmonary Rehabilitation  (COPD)   COPD Diagnosis: (See requirements below):  COPD-Gold 2   Program Prescription:  O2 Administration by RT, EP, or RN if SpO2<88%   Diet - low sodium heart healthy    Complete by:  As directed   Discharge instructions    Complete by:  As directed   Increase activity slowly    Complete by:  As directed      Signed: Valinda Party, DO 07/03/2016, 1:20 PM   Pager: [phone]

## 2016-07-04 ENCOUNTER — Telehealth: Payer: Self-pay | Admitting: Internal Medicine

## 2016-07-04 NOTE — Telephone Encounter (Signed)
I Spoke with Dustin Leonard at the skilled nursing facility Dustin Leonard was staying and let her know that he had been on 20 units of Levemir with Sliding Scale Insulin during his hospital admission.  I told her I discharged him with 47 units of levemir PRN because that was his home medication.  Since Dustin Leonard's fasting glucose was not very well controlled on our insulin regimen I recommended 30units of Levemir at bed time if his fasting glucose is continuously elevated.

## 2016-07-05 ENCOUNTER — Encounter: Payer: Self-pay | Admitting: Internal Medicine

## 2016-07-05 ENCOUNTER — Non-Acute Institutional Stay (SKILLED_NURSING_FACILITY): Payer: Medicare Other | Admitting: Internal Medicine

## 2016-07-05 DIAGNOSIS — I10 Essential (primary) hypertension: Secondary | ICD-10-CM

## 2016-07-05 DIAGNOSIS — E118 Type 2 diabetes mellitus with unspecified complications: Secondary | ICD-10-CM | POA: Diagnosis not present

## 2016-07-05 DIAGNOSIS — R4189 Other symptoms and signs involving cognitive functions and awareness: Secondary | ICD-10-CM | POA: Diagnosis not present

## 2016-07-05 DIAGNOSIS — N390 Urinary tract infection, site not specified: Secondary | ICD-10-CM | POA: Diagnosis not present

## 2016-07-05 DIAGNOSIS — D638 Anemia in other chronic diseases classified elsewhere: Secondary | ICD-10-CM | POA: Diagnosis not present

## 2016-07-05 DIAGNOSIS — R1310 Dysphagia, unspecified: Secondary | ICD-10-CM | POA: Diagnosis not present

## 2016-07-05 DIAGNOSIS — E46 Unspecified protein-calorie malnutrition: Secondary | ICD-10-CM

## 2016-07-05 DIAGNOSIS — J441 Chronic obstructive pulmonary disease with (acute) exacerbation: Secondary | ICD-10-CM

## 2016-07-05 DIAGNOSIS — Z8673 Personal history of transient ischemic attack (TIA), and cerebral infarction without residual deficits: Secondary | ICD-10-CM

## 2016-07-05 DIAGNOSIS — R5381 Other malaise: Secondary | ICD-10-CM

## 2016-07-05 NOTE — Progress Notes (Signed)
LOCATION: [facility]  PCP: Suzanna Obey, MD   Code Status: Full Code  Goals of care: Advanced Directive information Advanced Directives 06/30/2016  Does patient have an advance directive? No       Extended Emergency Contact Information Primary Emergency Contact: Lachman,Pinkie Address: [address] [place],  [address] [place] Phone: [phone] Mobile Phone: [phone] Relation: Spouse Secondary Emergency Contact: Fara [place] Phone: [phone] Mobile Phone: [phone] Relation: Son   No Known Allergies  Chief Complaint  Patient presents with  . New Admit To SNF    New Admission     HPI:  Patient is a 82 y.o. male seen today for short term rehabilitation post hospital admission from 06/30/16-07/03/16 with copd exacerbation. He responded well to systemic steroids and antibiotics. He had dysphagia, was seen by SLP, underwent barium swallow and was placed on dysphagia diet. He is seen in his room today with his wife at bedside.   Review of Systems:  Constitutional: Negative for fever, chills, diaphoresis. Energy level is slowly coming back. HENT: Negative for headache, congestion. He has hearing loss. Positive for occasional nasal discharge. currently on dysphagia diet.  Eyes: Negative for double vision and discharge.  Respiratory: Negative for shortness of breath and wheezing. Positive for cough.  Cardiovascular: Negative for chest pain, palpitations, leg swelling.  Gastrointestinal: Negative for heartburn, nausea, vomiting, abdominal pain. Last bowel movement was last night. Genitourinary: Negative for dysuria.  Musculoskeletal: Negative for back pain, fall in the facility.  Skin: Negative for itching, rash.  Neurological: Negative for dizziness. Psychiatric/Behavioral: Negative for depression.   Past Medical History:  Diagnosis Date  . Asthma   . Prostate cancer ([place])   . Stroke  [facility])    Past Surgical History:  Procedure Laterality Date  . PROSTATE SURGERY     Social History:   reports that he quit smoking about 58 years ago. His smoking use included Cigarettes. He does not have any smokeless tobacco history on file. He reports that he does not drink alcohol. His drug history is not on file.  Family History  Problem Relation Age of Onset  . Diabetes Maternal Aunt     Medications:   Medication List       Accurate as of 07/05/16 10:26 AM. Always use your most recent med list.          albuterol (2.5 MG/3ML) 0.083% nebulizer solution Commonly known as:  PROVENTIL Take 3 mLs (2.5 mg total) by nebulization every 6 (six) hours as needed for wheezing or shortness of breath.   albuterol 108 (90 Base) MCG/ACT inhaler Commonly known as:  PROVENTIL HFA;VENTOLIN HFA Inhale 2 puffs into the lungs every 6 (six) hours as needed for wheezing or shortness of breath.   aspirin 81 MG chewable tablet Chew 81 mg by mouth daily.   carvedilol 3.125 MG tablet Commonly known as:  COREG Take 3.125 mg by mouth 2 (two) times daily with a meal.   Cholecalciferol 1000 units tablet Take 1,000 Units by mouth daily.   clopidogrel 75 MG tablet Commonly known as:  PLAVIX Take 75 mg by mouth daily.   furosemide 40 MG tablet Commonly known as:  LASIX Take 1 tablet by mouth daily.   insulin detemir 100 UNIT/ML injection Commonly known as:  LEVEMIR Inject 20 Units into the skin at bedtime.   insulin lispro 100 UNIT/ML injection Commonly known as:  HUMALOG  Inject 0-10 Units into the skin 3 (three) times daily before meals. 0-59= hypoglycemic protocol, 60-149= 0 units, 150-250= 5 units, 251-300=8 units, 301-350= 10 units. >350 NOTIFY MD/NP   ipratropium-albuterol 0.5-2.5 (3) MG/3ML Soln Commonly known as:  DUONEB Take 3 mLs by nebulization every 6 (six) hours as needed.   predniSONE 20 MG tablet Commonly known as:  DELTASONE Take 2 tablets (40 mg total) by mouth daily  with breakfast.   ramipril 10 MG capsule Commonly known as:  ALTACE Take 10 mg by mouth daily.       Immunizations: Immunization History  Administered Date(s) Administered  . Influenza Split 07/24/2011, 07/23/2012, 07/23/2013  . Tdap 02/11/2014     Physical Exam:  Vitals:   07/05/16 1018  BP: (!) 162/78  Pulse: 88  Resp: 17  Temp: 98.1 F (36.7 C)  TempSrc: Oral  SpO2: 96%  Weight: 207 lb (93.9 kg)  Height: 5\' 9"  (1.753 m)   Body mass index is 30.57 kg/m.  General- elderly obese male, chronically ill appearing, in no acute distress Head- normocephalic, atraumatic Nose- no nasal discharge Throat- moist mucus membrane Eyes- no pallor, no icterus, no discharge, normal conjunctiva, normal sclera Neck- no cervical lymphadenopathy Cardiovascular- normal s1,s2, no murmur, no leg edema Respiratory- bilateral poor air entry and wheezing present. No rhonchi, no crackles, no use of accessory muscles Abdomen- bowel sounds present, soft, non tender but distended Musculoskeletal- able to move all 4 extremities, generalized weakness present Neurological- alert and oriented to place only Skin- warm and dry Psychiatry- normal mood and affect    Labs reviewed: Basic Metabolic Panel:  Recent Labs  06/30/16 1340 06/30/16 1353 07/01/16 0320 07/02/16 0855  NA 141 142 140 141  K 4.0 4.0 4.6 4.3  CL 108 103 107 110  CO2 25  --  25 25  GLUCOSE 217* 213* 323* 235*  BUN 17 21* 26* 28*  CREATININE 1.16 1.10 1.27* 1.30*  CALCIUM 9.0  --  8.6* 8.6*   Liver Function Tests:  Recent Labs  06/30/16 1340 07/01/16 0320  AST 27 25  ALT 13* 11*  ALKPHOS 72 65  BILITOT 0.8 0.6  PROT 6.6 5.6*  ALBUMIN 3.3* 2.7*   No results for input(s): LIPASE, AMYLASE in the last 8760 hours. No results for input(s): AMMONIA in the last 8760 hours. CBC:  Recent Labs  06/30/16 1340 06/30/16 1353 07/02/16 0855  WBC 14.3*  --  10.0  NEUTROABS 9.3*  --   --   HGB 13.2 14.3 12.4*  HCT  41.0 42.0 39.0  MCV 90.1  --  91.5  PLT 130*  --  121*   Cardiac Enzymes: No results for input(s): CKTOTAL, CKMB, CKMBINDEX, TROPONINI in the last 8760 hours. BNP: Invalid input(s): POCBNP CBG:  Recent Labs  07/02/16 2246 07/03/16 0819 07/03/16 1201  GLUCAP 285* 162* 171*    Radiological Exams: X-ray Chest Pa And Lateral  Result Date: 07/01/2016 CLINICAL DATA:  Pneumonia. EXAM: CHEST  2 VIEW COMPARISON:  Radiograph of June 30, 2016. FINDINGS: Stable cardiomediastinal silhouette. Atherosclerosis thoracic aorta is noted. No pneumothorax or pleural effusion is noted. No acute pulmonary disease is noted. Bony thorax is unremarkable. IMPRESSION: No active cardiopulmonary disease.  Aortic atherosclerosis. Electronically Signed   By: Marijo Conception, M.D.   On: 07/01/2016 10:54   Ct Head Wo Contrast  Result Date: 06/30/2016 CLINICAL DATA:  Unresponsive patient. Right-sided facial droop and weakness. EXAM: CT HEAD WITHOUT CONTRAST TECHNIQUE: Contiguous axial images were obtained from  the base of the skull through the vertex without intravenous contrast. COMPARISON:  MRI 03/19/2011, CT scan brain 03/18/2011, 03/14/2011 FINDINGS: Brain: There is no acute territorial infarction, intracranial hemorrhage, or focal mass lesion identified. There are no extra-axial fluid collections. Old infarcts involving the left basal ganglia and corona radiata. New mild ex vacuo dilatation of the anterior aspect of left lateral ventricle. Ventricles are otherwise stable in morphology. No mass effect. No midline shift. There has been progression of cortical volume loss. Physiologic basal ganglial calcifications. Vascular: Vascular calcifications are present within the vertebrobasilar system as well as within the internal carotid arteries at the skullbase. Mild increased density in the region of left middle cerebral artery on axial views but appears symmetric on coronal views. Skull: No acute fracture or suspicious bone  lesion is visualized. Incidental note is made of incomplete fusion of posterior arch of C1 as before. Mastoid air cells are clear. Sinuses/Orbits: Mild mucosal thickening visualized within the maxillary and ethmoid sinuses. There is bilateral aphakia. Globes otherwise appear intact. Other: Soft tissue or cardiologic disc calcifications are again noted within the bilateral pinna IMPRESSION: 1. No definite acute intracranial abnormality. MRI follow-up may be obtained as indicated given clinical history. 2. Old infarct involving the left basal ganglia and left periventricular white matter. 3. Interval increase in cerebral volume loss 4. Mild sinus disease Electronically Signed   By: Donavan Foil M.D.   On: 06/30/2016 15:07   Dg Chest Port 1 View  Result Date: 06/30/2016 CLINICAL DATA:  Shortness of breath and unresponsiveness EXAM: PORTABLE CHEST 1 VIEW COMPARISON:  09/22/2014 FINDINGS: Mild cardiac enlargement. There is aortic atherosclerosis noted. Pulmonary vascular congestion is identified. No airspace consolidation. IMPRESSION: Aortic atherosclerosis and pulmonary vascular congestion. Electronically Signed   By: Kerby Moors M.D.   On: 06/30/2016 14:39   Dg Swallowing Func-speech Pathology  Result Date: 07/03/2016 Late Entry Study completed by Clovis Cao, MA CCC-SLP Note populated by [facility], [place] CCC-SLP [phone] in her absence. Objective Swallowing Evaluation: Type of Study: MBS-Modified Barium Swallow Study Patient Details Name: Trask Wnek MRN: [id number] Date of Birth: 10/04/1929 Today's Date: 07/03/2016 Time: SLP Start Time (ACUTE ONLY): 1305-SLP Stop Time (ACUTE ONLY): 1325 SLP Time Calculation (min) (ACUTE ONLY): 20 min Past Medical History: Past Medical History: Diagnosis Date . Asthma  . Prostate cancer ([place])  . Stroke [facility])  Past Surgical History: Past Surgical History: Procedure Laterality Date . PROSTATE SURGERY   HPI: Mr. Lindstrom is an 82 year old male with past medical history of  asthma, prostate cancer, CVA, type II diabetes that presents to the ED with shortness of breath. Wife and son are at the bedside and help provide history. This morning, patient's wife went to get him out of bed but he couldn't get up due to weakness. Per son, his speech is at baseline,since his TIA several years ago.  He spoke to his son on the phone and his breathing was labored and stated he couldn't breath and couldn't get out of bed. In the last 5 years, patient has been doing well without hospitalization.  Subjective: The patient was seen in radiology for MBS to determine safest oral diet.  Assessment / Plan / Recommendation CHL IP CLINICAL IMPRESSIONS 07/01/2016 Therapy Diagnosis Mild oral phase dysphagia;Moderate pharyngeal phase dysphagia Clinical Impression MBS was completed using thin liquids via tsp and cup sips, nectar thick liquids via cup sip, honey thick liquids via cup sips, pureed material and dual textured solids.  The patient presented with mild oral  and moderate pharyngeal dysphagia characterized by delayed oral transit, premature loss of the bolus and mild tongue base residue given purees and dual textured solids and delayed swallow trigger across all textures.  Silent aspiration of thin liquids via self fed cup sips was seen during the swallow.  Silent penetration was seen given nectar thick liquids via self fed cup sips during the swallow.  Penetration/aspiration was not seen given self fed cup sips of honey thick liquids, purees and dual textured solids.  Esophageal sweep did not reveal overt issues.  Recommend begin a dysphagia 2 diet with honey thick liquids.  The patient requires FULL SUPERVISION to control rate and amount of intake.  Medications should be crushed in purees and patient should take ONE SIP at a time.  ST to follow up for therapeutic diet tolerance and swallowing therapy.  MD please order HHST for continued dysphagia therapy after DC.   Impact on safety and function Mild  aspiration risk   CHL IP TREATMENT RECOMMENDATION 07/01/2016 Treatment Recommendations Therapy as outlined in treatment plan below   Prognosis 07/01/2016 Prognosis for Safe Diet Advancement Fair Barriers to Reach Goals -- Barriers/Prognosis Comment -- CHL IP DIET RECOMMENDATION 07/01/2016 SLP Diet Recommendations Dysphagia 2 (Fine chop) solids;Honey thick liquids Liquid Administration via Cup;No straw Medication Administration Crushed with puree Compensations Slow rate;Small sips/bites Postural Changes Seated upright at 90 degrees   CHL IP OTHER RECOMMENDATIONS 07/01/2016 Recommended Consults -- Oral Care Recommendations Oral care BID Other Recommendations Order thickener from pharmacy;Prohibited food (jello, ice cream, thin soups);Have oral suction available   CHL IP FOLLOW UP RECOMMENDATIONS 07/01/2016 Follow up Recommendations Home health SLP   CHL IP FREQUENCY AND DURATION 07/01/2016 Speech Therapy Frequency (ACUTE ONLY) min 2x/week Treatment Duration 2 weeks      CHL IP ORAL PHASE 07/01/2016 Oral Phase Impaired Oral - Pudding Teaspoon -- Oral - Pudding Cup -- Oral - Honey Teaspoon -- Oral - Honey Cup -- Oral - Nectar Teaspoon -- Oral - Nectar Cup -- Oral - Nectar Straw -- Oral - Thin Teaspoon -- Oral - Thin Cup -- Oral - Thin Straw -- Oral - Puree Delayed oral transit;Decreased bolus cohesion;Premature spillage Oral - Mech Soft -- Oral - Regular -- Oral - Multi-Consistency Impaired mastication;Delayed oral transit;Decreased bolus cohesion;Premature spillage;Piecemeal swallowing Oral - Pill -- Oral Phase - Comment --  CHL IP PHARYNGEAL PHASE 07/01/2016 Pharyngeal Phase Impaired Pharyngeal- Pudding Teaspoon -- Pharyngeal -- Pharyngeal- Pudding Cup -- Pharyngeal -- Pharyngeal- Honey Teaspoon -- Pharyngeal -- Pharyngeal- Honey Cup Delayed swallow initiation-vallecula Pharyngeal -- Pharyngeal- Nectar Teaspoon -- Pharyngeal -- Pharyngeal- Nectar Cup Delayed swallow initiation-pyriform sinuses;Penetration/Aspiration during swallow  Pharyngeal Material enters airway, remains ABOVE vocal cords and not ejected out Pharyngeal- Nectar Straw -- Pharyngeal -- Pharyngeal- Thin Teaspoon Delayed swallow initiation-pyriform sinuses Pharyngeal -- Pharyngeal- Thin Cup Delayed swallow initiation-pyriform sinuses;Penetration/Aspiration during swallow Pharyngeal Material enters airway, passes BELOW cords without attempt by patient to eject out (silent aspiration) Pharyngeal- Thin Straw -- Pharyngeal -- Pharyngeal- Puree Delayed swallow initiation-vallecula Pharyngeal -- Pharyngeal- Mechanical Soft -- Pharyngeal -- Pharyngeal- Regular -- Pharyngeal -- Pharyngeal- Multi-consistency Delayed swallow initiation-vallecula Pharyngeal -- Pharyngeal- Pill -- Pharyngeal -- Pharyngeal Comment --  CHL IP CERVICAL ESOPHAGEAL PHASE 07/01/2016 Cervical Esophageal Phase WFL Pudding Teaspoon -- Pudding Cup -- Honey Teaspoon -- Honey Cup -- Nectar Teaspoon -- Nectar Cup -- Nectar Straw -- Thin Teaspoon -- Thin Cup -- Thin Straw -- Puree -- Mechanical Soft -- Regular -- Multi-consistency -- Pill -- Cervical Esophageal Comment -- Completed  by Clovis Cao, MA CCC-SLP Note populated by Herbie [place], [place] CCC-SLP [phone] in her absence.               Assessment/Plan  Physical deconditioning Will have him work with physical therapy and occupational therapy team to help with gait training and muscle strengthening exercises.fall precautions. Skin care. Encourage to be out of bed.   Copd exacerbation D/c his levaquin as course to be completed on 07/04/16. Continue and complete his prednisone today. Continue duoneb but change this to qid x 1 week and then q8h prn  Dysphagia Continue dysphagia 2 diet with honey thick liquids and SLP to evaluate  Protein calorie malnutrition Monitor po intake. Get RD to evaluate.   uti Completed his antibiotic yesterday. Maintain hydration and perineal hygiene  Cognitive impairment Likely has vascular dementia component with hx of cva  in past along with DM and HTN. Supportive care for now with antihypertensives. Fall precautions  Anemia of chronic disease Monitor cbc   Renal impairment Monitor bmp  Dm Lab Results  Component Value Date   HGBA1C (H) 03/15/2011    10.8 (NOTE) Result repeated and verified.                                                                       According to the ADA Clinical Practice Recommendations for 2011, when HbA1c is used as a screening test:   >=6.5%   Diagnostic of Diabetes Mellitus            (if abnormal result is confirmed)  5.7-6.4%   Increased risk of developing Diabetes Mellitus  References:Diagnosis and Classification of Diabetes Mellitus,Diabetes [id number] 1):S62-S69 and Standards of Medical Care in         Diabetes  - 2011,Diabetes [id number] (Suppl 1):S11-S61.   Check a1c. Continue levemir 20 u daily and SSI. Monitor cbg  HTN Monitor bp q shift x 1 week. Continue coreg 3.125 mg bid and ramipril 10 mg daily and lasix. Check bmp  History of CVA Has been on his aspirin and plavix, continue this     Goals of care: short term rehabilitation   Labs/tests ordered: cbc, cmp, a1c, tsh 07/06/16  Family/ staff Communication: reviewed care plan with patient, his wife and nursing supervisor    Blanchie Serve, MD Internal Medicine [address], [place] [address] Cell Phone (Monday-Friday 8 am - 5 pm): [phone] On Call: [phone] and follow prompts after 5 pm and on weekends Office Phone: [phone] Office Fax: [phone]

## 2016-07-06 LAB — CBC AND DIFFERENTIAL
HCT: 44 % (ref 41–53)
HEMATOCRIT: 37 % — AB (ref 41–53)
HEMOGLOBIN: 11.9 g/dL — AB (ref 13.5–17.5)
Hemoglobin: 13.7 g/dL (ref 13.5–17.5)
PLATELETS: 112 10*3/uL — AB (ref 150–399)
PLATELETS: 235 10*3/uL (ref 150–399)
WBC: 7.5 10*3/mL
WBC: 5.9 10^3/mL

## 2016-07-06 LAB — BASIC METABOLIC PANEL
BUN: 23 mg/dL — AB (ref 4–21)
CREATININE: 0.9 mg/dL (ref 0.6–1.3)
Glucose: 152 mg/dL
Potassium: 3.5 mmol/L (ref 3.4–5.3)
Sodium: 144 mmol/L (ref 137–147)

## 2016-07-06 LAB — HEPATIC FUNCTION PANEL
ALK PHOS: 56 U/L (ref 25–125)
ALT: 14 U/L (ref 10–40)
AST: 13 U/L — AB (ref 14–40)
BILIRUBIN, TOTAL: 0.3 mg/dL

## 2016-07-06 LAB — TSH: TSH: 0.93 u[IU]/mL (ref 0.41–5.90)

## 2016-07-06 LAB — HEMOGLOBIN A1C: HEMOGLOBIN A1C: 10

## 2016-07-07 ENCOUNTER — Encounter: Payer: Self-pay | Admitting: Family

## 2016-07-07 ENCOUNTER — Non-Acute Institutional Stay (SKILLED_NURSING_FACILITY): Payer: Medicare Other | Admitting: Family

## 2016-07-07 DIAGNOSIS — E1165 Type 2 diabetes mellitus with hyperglycemia: Secondary | ICD-10-CM

## 2016-07-07 DIAGNOSIS — Z794 Long term (current) use of insulin: Secondary | ICD-10-CM

## 2016-07-07 NOTE — Progress Notes (Signed)
Location:   [facility] Room Number: [place]:  SNF [phone]) Provider:  Marlowe Sax, FNP-C   Suzanna Obey, MD  Patient Care Team: Katherina Mires, MD as PCP - General (Family Medicine)  Extended Emergency Contact Information Primary Emergency Contact: [facility] Address: [address], [place] [address] [place] Phone: [phone] Mobile Phone: [phone] Relation: Spouse Secondary Emergency Contact: [place] Phone: [phone] Mobile Phone: [phone] Relation: Son  Code Status:  Full Code Goals of care: Advanced Directive information Advanced Directives 07/07/2016  Does patient have an advance directive? No     Chief Complaint  Patient presents with  . Acute Visit    Follow-up on Abnormal labs    HPI:  Pt is a 82 y.o. male seen today at [facility] for an acute visit for evaluation of abnormal labs. He has a medical history of Cardiomyopathy, COPD, Types DM, Prostate Cancer among other conditions. He is seen in his room today. He denies any acute issues this visit. His recent lab results showed Hgb A1C 10 Facility Nurse reports CBG's in the 400's . He is currently on Humalog per SSI and Levemir 20 units at bedtime. He denies any signs of hyperglycemia.   Past Medical History:  Diagnosis Date  . Asthma   . Prostate cancer ([place])   . Stroke [facility])    Past Surgical History:  Procedure Laterality Date  . PROSTATE SURGERY      No Known Allergies    Medication List       Accurate as of 07/07/16 10:20 AM. Always use your most recent med list.          aspirin 81 MG chewable tablet Chew 81 mg by mouth daily.   carvedilol 3.125 MG tablet Commonly known as:  COREG Take 3.125 mg by mouth 2 (two) times daily with a meal.   Cholecalciferol 1000 units tablet Take 1,000 Units by mouth daily.   clopidogrel 75 MG  tablet Commonly known as:  PLAVIX Take 75 mg by mouth daily.   furosemide 40 MG tablet Commonly known as:  LASIX Take 1 tablet by mouth daily.   insulin detemir 100 UNIT/ML injection Commonly known as:  LEVEMIR Inject 20 Units into the skin at bedtime.   insulin lispro 100 UNIT/ML injection Commonly known as:  HUMALOG Inject 0-10 Units into the skin 3 (three) times daily before meals. 0-59= hypoglycemic protocol, 60-149= 0 units, 150-250= 5 units, 251-300=8 units, 301-350= 10 units. >350 NOTIFY MD/NP   ipratropium-albuterol 0.5-2.5 (3) MG/3ML Soln Commonly known as:  DUONEB Take 3 mLs by nebulization every 6 (six) hours as needed.   NUTRITIONAL SUPPLEMENTS PO Give Frozen (Magic Cup) by mouth twice daily with lunch and dinner.   ramipril 10 MG capsule Commonly known as:  ALTACE Take 10 mg by mouth daily.       Review of Systems  Constitutional: Negative for activity change, appetite change, chills, fatigue and fever.  HENT: Negative for rhinorrhea, sinus pressure, sneezing and sore throat.   Eyes:       Right eye cloudy   Respiratory: Negative for cough, chest tightness, shortness of breath and wheezing.   Cardiovascular: Negative for chest pain, palpitations and leg swelling.  Gastrointestinal: Negative for abdominal distention, abdominal pain, constipation, diarrhea, nausea and vomiting.  Endocrine: Negative for cold intolerance, heat intolerance, polydipsia,  polyphagia and polyuria.  Genitourinary: Negative for dysuria, frequency and urgency.  Musculoskeletal: Positive for gait problem.  Skin: Negative.   Neurological: Negative for dizziness, syncope and headaches.  Psychiatric/Behavioral: Negative for agitation, confusion, hallucinations and sleep disturbance.    Immunization History  Administered Date(s) Administered  . Influenza Split 07/24/2011, 07/23/2012, 07/23/2013  . Tdap 02/11/2014   Pertinent  Health Maintenance Due  Topic Date Due  . FOOT EXAM   05/06/1939  . OPHTHALMOLOGY EXAM  05/06/1939  . PNA vac Low Risk Adult (1 of 2 - PCV13) 05/06/1996  . HEMOGLOBIN A1C  09/15/2011  . INFLUENZA VACCINE  05/23/2016   No flowsheet data found. Functional Status Survey:    Vitals:   07/07/16 1002  BP: 137/72  Pulse: 80  Resp: 18  Temp: 98 F (36.7 C)  SpO2: 97%  Weight: 207 lb (93.9 kg)  Height: 5\' 9"  (1.753 m)   Body mass index is 30.57 kg/m. Physical Exam  Constitutional: He appears well-developed and well-nourished. No distress.  HENT:  Head: Normocephalic.  Mouth/Throat: Oropharynx is clear and moist. No oropharyngeal exudate.  Eyes: Right eye exhibits no discharge. Left eye exhibits no discharge. No scleral icterus.  Left eye Pupil round reactive to light . Right eye difficult to discern due to cloudy in color.   Neck: Normal range of motion. No JVD present. No thyromegaly present.  Cardiovascular: Normal rate, normal heart sounds and intact distal pulses.  Exam reveals no gallop and no friction rub.   No murmur heard. Pulmonary/Chest: Effort normal and breath sounds normal. No respiratory distress. He has no wheezes. He has no rales.  Abdominal: Soft. Bowel sounds are normal. He exhibits no distension. There is no tenderness. There is no rebound and no guarding.  Musculoskeletal: He exhibits no edema, tenderness or deformity.  Generalized weakness. Uses wheelchair   Lymphadenopathy:    He has no cervical adenopathy.  Neurological: He is alert.  Skin: Skin is warm and dry. No rash noted. He is not diaphoretic. No erythema. No pallor.  Psychiatric: He has a normal mood and affect.    Labs reviewed:  Recent Labs  06/30/16 1340 06/30/16 1353 07/01/16 0320 07/02/16 0855 07/06/16  NA 141 142 140 141 144  K 4.0 4.0 4.6 4.3 3.5  CL 108 103 107 110  --   CO2 25  --  25 25  --   GLUCOSE 217* 213* 323* 235*  --   BUN 17 21* 26* 28* 23*  CREATININE 1.16 1.10 1.27* 1.30* 0.9  CALCIUM 9.0  --  8.6* 8.6*  --     Recent  Labs  06/30/16 1340 07/01/16 0320 07/06/16  AST 27 25 13*  ALT 13* 11* 14  ALKPHOS 72 65 56  BILITOT 0.8 0.6  --   PROT 6.6 5.6*  --   ALBUMIN 3.3* 2.7*  --     Recent Labs  06/30/16 1340  07/02/16 0855 07/06/16 07/06/16 1336  WBC 14.3*  --  10.0 7.5 5.9  NEUTROABS 9.3*  --   --   --   --   HGB 13.2  < > 12.4* 11.9* 13.7  HCT 41.0  < > 39.0 37* 44  MCV 90.1  --  91.5  --   --   PLT 130*  --  121* 112* 235  < > = values in this interval not displayed. Lab Results  Component Value Date   TSH 0.93 07/06/2016   Lab Results  Component Value Date  HGBA1C 10 07/06/2016   Lab Results  Component Value Date   CHOL 225 (H) 03/15/2011   HDL 43 03/15/2011   LDLCALC (H) 03/15/2011    161        Total Cholesterol/HDL:CHD Risk Coronary Heart Disease Risk Table                     Men   Women  1/2 Average Risk   3.4   3.3  Average Risk       5.0   4.4  2 X Average Risk   9.6   7.1  3 X Average Risk  23.4   11.0        Use the calculated Patient Ratio above and the CHD Risk Table to determine the patient's CHD Risk.        ATP III CLASSIFICATION (LDL):  <100     mg/dL   Optimal  100-129  mg/dL   Near or Above                    Optimal  130-159  mg/dL   Borderline  160-189  mg/dL   High  >190     mg/dL   Very High   TRIG 107 03/15/2011   CHOLHDL 5.2 03/15/2011    Assessment/Plan Type 2 DM  Hgb A1C 10 ( 07/06/2016). Continue with Humalog per SSI.Change Levemir to 24 units SQ at beditme.   Family/ staff Communication: Reviewed plan of care with patient and facility Nurse supervisor.  Labs/tests ordered:  None

## 2016-07-12 ENCOUNTER — Non-Acute Institutional Stay (SKILLED_NURSING_FACILITY): Payer: Medicare Other | Admitting: Internal Medicine

## 2016-07-12 ENCOUNTER — Encounter: Payer: Self-pay | Admitting: Internal Medicine

## 2016-07-12 DIAGNOSIS — Z8673 Personal history of transient ischemic attack (TIA), and cerebral infarction without residual deficits: Secondary | ICD-10-CM

## 2016-07-12 DIAGNOSIS — R5381 Other malaise: Secondary | ICD-10-CM

## 2016-07-12 DIAGNOSIS — Z794 Long term (current) use of insulin: Secondary | ICD-10-CM

## 2016-07-12 DIAGNOSIS — J439 Emphysema, unspecified: Secondary | ICD-10-CM

## 2016-07-12 DIAGNOSIS — E1165 Type 2 diabetes mellitus with hyperglycemia: Secondary | ICD-10-CM

## 2016-07-12 DIAGNOSIS — R4189 Other symptoms and signs involving cognitive functions and awareness: Secondary | ICD-10-CM

## 2016-07-12 NOTE — Progress Notes (Signed)
LOCATION: [facility]  PCP: Suzanna Obey, MD   Code Status: Full Code  Goals of care: Advanced Directive information Advanced Directives 07/07/2016  Does patient have an advance directive? No       Extended Emergency Contact Information Primary Emergency Contact: Ang,Pinkie Address: [address], [place] [address] [place] Phone: [phone] Mobile Phone: [phone] Relation: Spouse Secondary Emergency Contact: [place] Phone: [phone] Mobile Phone: [phone] Relation: Son   No Known Allergies  Chief Complaint  Patient presents with  . Discharge Note    Discharge Visit     HPI:  Patient is a 82 y.o. male seen today for discharge visit. He has worked with therapy team and made progress. There has been improvement in his strength. He has been here for short term rehabilitation post hospital admission from 06/30/16-07/03/16 with copd exacerbation and UTI. He has completed his antibiotics. He is using his bronchodilator and his breathing is currently stable. His cbg has been better controlled with change in levemir dosing. Reviewed his labs and Hb level has improved. He continues to have some memory issue.  Review of Systems:  Constitutional: Negative for fever HENT: Negative for headache, congestion. He has hearing loss.  Eyes: Negative for double vision and discharge.  Respiratory: Negative for shortness of breath and wheezing. Positive for cough.  Cardiovascular: Negative for chest pain, palpitations, leg swelling.  Gastrointestinal: Negative for heartburn, nausea, vomiting, abdominal pain. Genitourinary: Negative for dysuria.  Musculoskeletal: Negative for back pain, fall in the facility.  Skin: Negative for itching, rash.  Neurological: Negative for dizziness. Psychiatric/Behavioral: Negative for depression.   Past Medical History:  Diagnosis Date  . Asthma   . Prostate  cancer ([place])   . Stroke [facility])    Past Surgical History:  Procedure Laterality Date  . PROSTATE SURGERY       Medications:   Medication List       Accurate as of 07/12/16 11:49 AM. Always use your most recent med list.          albuterol 108 (90 Base) MCG/ACT inhaler Commonly known as:  PROVENTIL HFA;VENTOLIN HFA Inhale 2 puffs into the lungs every 6 (six) hours as needed for wheezing or shortness of breath.   aspirin 81 MG chewable tablet Chew 81 mg by mouth daily.   carvedilol 3.125 MG tablet Commonly known as:  COREG Take 3.125 mg by mouth 2 (two) times daily with a meal.   Cholecalciferol 1000 units tablet Take 1,000 Units by mouth daily.   clopidogrel 75 MG tablet Commonly known as:  PLAVIX Take 75 mg by mouth daily.   furosemide 40 MG tablet Commonly known as:  LASIX Take 40 mg by mouth daily.   insulin detemir 100 UNIT/ML injection Commonly known as:  LEVEMIR Inject 24 Units into the skin at bedtime.   insulin lispro 100 UNIT/ML injection Commonly known as:  HUMALOG Inject 0-10 Units into the skin 3 (three) times daily before meals. 0-59= hypoglycemic protocol, 60-149= 0 units, 150-250= 5 units, 251-300=8 units, 301-350= 10 units. >350 NOTIFY MD/NP   ipratropium-albuterol 0.5-2.5 (3) MG/3ML Soln Commonly known as:  DUONEB Take 3 mLs by nebulization every 6 (six) hours as needed.   latanoprost 0.005 % ophthalmic solution Commonly known as:  XALATAN Place 1 drop into both eyes at bedtime.   lisinopril 40 MG tablet Commonly known as:  PRINIVIL,ZESTRIL Take 40 mg by  mouth daily.       Immunizations: Immunization History  Administered Date(s) Administered  . Influenza Split 07/24/2011, 07/23/2012, 07/23/2013  . PPD Test 07/03/2016  . Tdap 02/11/2014     Physical Exam:  Vitals:   07/12/16 1144  BP: 118/64  Pulse: 78  Resp: 18  Temp: 97.3 F (36.3 C)  TempSrc: Oral  SpO2: 95%  Weight: 207 lb (93.9 kg)  Height: 5\' 9"  (1.753 m)   Body  mass index is 30.57 kg/m.  General- elderly obese male, chronically ill appearing, in no acute distress Head- normocephalic, atraumatic Throat- moist mucus membrane Eyes- no pallor, no icterus, no discharge, normal conjunctiva, normal sclera Neck- no cervical lymphadenopathy Cardiovascular- normal s1,s2, no murmur, no leg edema Respiratory- bilateral poor air entry. No rhonchi, no wheezing, no crackles, no use of accessory muscles Abdomen- bowel sounds present, soft, non tender but distended Musculoskeletal- able to move all 4 extremities Neurological- alert and oriented to place and time Skin- warm and dry   Labs reviewed: Basic Metabolic Panel:  Recent Labs  06/30/16 1340 06/30/16 1353 07/01/16 0320 07/02/16 0855 07/06/16  NA 141 142 140 141 144  K 4.0 4.0 4.6 4.3 3.5  CL 108 103 107 110  --   CO2 25  --  25 25  --   GLUCOSE 217* 213* 323* 235*  --   BUN 17 21* 26* 28* 23*  CREATININE 1.16 1.10 1.27* 1.30* 0.9  CALCIUM 9.0  --  8.6* 8.6*  --    Liver Function Tests:  Recent Labs  06/30/16 1340 07/01/16 0320 07/06/16  AST 27 25 13*  ALT 13* 11* 14  ALKPHOS 72 65 56  BILITOT 0.8 0.6  --   PROT 6.6 5.6*  --   ALBUMIN 3.3* 2.7*  --    No results for input(s): LIPASE, AMYLASE in the last 8760 hours. No results for input(s): AMMONIA in the last 8760 hours. CBC:  Recent Labs  06/30/16 1340  07/02/16 0855 07/06/16 07/06/16 1336  WBC 14.3*  --  10.0 7.5 5.9  NEUTROABS 9.3*  --   --   --   --   HGB 13.2  < > 12.4* 11.9* 13.7  HCT 41.0  < > 39.0 37* 44  MCV 90.1  --  91.5  --   --   PLT 130*  --  121* 112* 235  < > = values in this interval not displayed. Cardiac Enzymes: No results for input(s): CKTOTAL, CKMB, CKMBINDEX, TROPONINI in the last 8760 hours. BNP: Invalid input(s): POCBNP CBG:  Recent Labs  07/02/16 2246 07/03/16 0819 07/03/16 1201  GLUCAP 285* 162* 171*    Radiological Exams: X-ray Chest Pa And Lateral  Result Date:  07/01/2016 CLINICAL DATA:  Pneumonia. EXAM: CHEST  2 VIEW COMPARISON:  Radiograph of June 30, 2016. FINDINGS: Stable cardiomediastinal silhouette. Atherosclerosis thoracic aorta is noted. No pneumothorax or pleural effusion is noted. No acute pulmonary disease is noted. Bony thorax is unremarkable. IMPRESSION: No active cardiopulmonary disease.  Aortic atherosclerosis. Electronically Signed   By: Marijo Conception, M.D.   On: 07/01/2016 10:54   Ct Head Wo Contrast  Result Date: 06/30/2016 CLINICAL DATA:  Unresponsive patient. Right-sided facial droop and weakness. EXAM: CT HEAD WITHOUT CONTRAST TECHNIQUE: Contiguous axial images were obtained from the base of the skull through the vertex without intravenous contrast. COMPARISON:  MRI 03/19/2011, CT scan brain 03/18/2011, 03/14/2011 FINDINGS: Brain: There is no acute territorial infarction, intracranial hemorrhage, or focal mass lesion  identified. There are no extra-axial fluid collections. Old infarcts involving the left basal ganglia and corona radiata. New mild ex vacuo dilatation of the anterior aspect of left lateral ventricle. Ventricles are otherwise stable in morphology. No mass effect. No midline shift. There has been progression of cortical volume loss. Physiologic basal ganglial calcifications. Vascular: Vascular calcifications are present within the vertebrobasilar system as well as within the internal carotid arteries at the skullbase. Mild increased density in the region of left middle cerebral artery on axial views but appears symmetric on coronal views. Skull: No acute fracture or suspicious bone lesion is visualized. Incidental note is made of incomplete fusion of posterior arch of C1 as before. Mastoid air cells are clear. Sinuses/Orbits: Mild mucosal thickening visualized within the maxillary and ethmoid sinuses. There is bilateral aphakia. Globes otherwise appear intact. Other: Soft tissue or cardiologic disc calcifications are again noted  within the bilateral pinna IMPRESSION: 1. No definite acute intracranial abnormality. MRI follow-up may be obtained as indicated given clinical history. 2. Old infarct involving the left basal ganglia and left periventricular white matter. 3. Interval increase in cerebral volume loss 4. Mild sinus disease Electronically Signed   By: Donavan Foil M.D.   On: 06/30/2016 15:07   Dg Chest Port 1 View  Result Date: 06/30/2016 CLINICAL DATA:  Shortness of breath and unresponsiveness EXAM: PORTABLE CHEST 1 VIEW COMPARISON:  09/22/2014 FINDINGS: Mild cardiac enlargement. There is aortic atherosclerosis noted. Pulmonary vascular congestion is identified. No airspace consolidation. IMPRESSION: Aortic atherosclerosis and pulmonary vascular congestion. Electronically Signed   By: Kerby Moors M.D.   On: 06/30/2016 14:39   Dg Swallowing Func-speech Pathology  Result Date: 07/03/2016 Late Entry Study completed by Clovis Cao, MA CCC-SLP Note populated by [facility], [place] CCC-SLP [phone] in her absence. Objective Swallowing Evaluation: Type of Study: MBS-Modified Barium Swallow Study Patient Details Name: [facility] MRN: [id number] Date of Birth: 13-Feb-1929 Today's Date: 07/03/2016 Time: SLP Start Time (ACUTE ONLY): 1305-SLP Stop Time (ACUTE ONLY): 1325 SLP Time Calculation (min) (ACUTE ONLY): 20 min Past Medical History: Past Medical History: Diagnosis Date . Asthma  . Prostate cancer ([place])  . Stroke [facility])  Past Surgical History: Past Surgical History: Procedure Laterality Date . PROSTATE SURGERY   HPI: Mr. Ponzi is an 82 year old male with past medical history of asthma, prostate cancer, CVA, type II diabetes that presents to the ED with shortness of breath. Wife and son are at the bedside and help provide history. This morning, patient's wife went to get him out of bed but he couldn't get up due to weakness. Per son, his speech is at baseline,since his TIA several years ago.  He spoke to his son on the  phone and his breathing was labored and stated he couldn't breath and couldn't get out of bed. In the last 5 years, patient has been doing well without hospitalization.  Subjective: The patient was seen in radiology for MBS to determine safest oral diet.  Assessment / Plan / Recommendation CHL IP CLINICAL IMPRESSIONS 07/01/2016 Therapy Diagnosis Mild oral phase dysphagia;Moderate pharyngeal phase dysphagia Clinical Impression MBS was completed using thin liquids via tsp and cup sips, nectar thick liquids via cup sip, honey thick liquids via cup sips, pureed material and dual textured solids.  The patient presented with mild oral and moderate pharyngeal dysphagia characterized by delayed oral transit, premature loss of the bolus and mild tongue base residue given purees and dual textured solids and delayed swallow trigger across all textures.  Silent  aspiration of thin liquids via self fed cup sips was seen during the swallow.  Silent penetration was seen given nectar thick liquids via self fed cup sips during the swallow.  Penetration/aspiration was not seen given self fed cup sips of honey thick liquids, purees and dual textured solids.  Esophageal sweep did not reveal overt issues.  Recommend begin a dysphagia 2 diet with honey thick liquids.  The patient requires FULL SUPERVISION to control rate and amount of intake.  Medications should be crushed in purees and patient should take ONE SIP at a time.  ST to follow up for therapeutic diet tolerance and swallowing therapy.  MD please order HHST for continued dysphagia therapy after DC.   Impact on safety and function Mild aspiration risk   CHL IP TREATMENT RECOMMENDATION 07/01/2016 Treatment Recommendations Therapy as outlined in treatment plan below   Prognosis 07/01/2016 Prognosis for Safe Diet Advancement Fair Barriers to Reach Goals -- Barriers/Prognosis Comment -- CHL IP DIET RECOMMENDATION 07/01/2016 SLP Diet Recommendations Dysphagia 2 (Fine chop) solids;Honey thick  liquids Liquid Administration via Cup;No straw Medication Administration Crushed with puree Compensations Slow rate;Small sips/bites Postural Changes Seated upright at 90 degrees   CHL IP OTHER RECOMMENDATIONS 07/01/2016 Recommended Consults -- Oral Care Recommendations Oral care BID Other Recommendations Order thickener from pharmacy;Prohibited food (jello, ice cream, thin soups);Have oral suction available   CHL IP FOLLOW UP RECOMMENDATIONS 07/01/2016 Follow up Recommendations Home health SLP   CHL IP FREQUENCY AND DURATION 07/01/2016 Speech Therapy Frequency (ACUTE ONLY) min 2x/week Treatment Duration 2 weeks      CHL IP ORAL PHASE 07/01/2016 Oral Phase Impaired Oral - Pudding Teaspoon -- Oral - Pudding Cup -- Oral - Honey Teaspoon -- Oral - Honey Cup -- Oral - Nectar Teaspoon -- Oral - Nectar Cup -- Oral - Nectar Straw -- Oral - Thin Teaspoon -- Oral - Thin Cup -- Oral - Thin Straw -- Oral - Puree Delayed oral transit;Decreased bolus cohesion;Premature spillage Oral - Mech Soft -- Oral - Regular -- Oral - Multi-Consistency Impaired mastication;Delayed oral transit;Decreased bolus cohesion;Premature spillage;Piecemeal swallowing Oral - Pill -- Oral Phase - Comment --  CHL IP PHARYNGEAL PHASE 07/01/2016 Pharyngeal Phase Impaired Pharyngeal- Pudding Teaspoon -- Pharyngeal -- Pharyngeal- Pudding Cup -- Pharyngeal -- Pharyngeal- Honey Teaspoon -- Pharyngeal -- Pharyngeal- Honey Cup Delayed swallow initiation-vallecula Pharyngeal -- Pharyngeal- Nectar Teaspoon -- Pharyngeal -- Pharyngeal- Nectar Cup Delayed swallow initiation-pyriform sinuses;Penetration/Aspiration during swallow Pharyngeal Material enters airway, remains ABOVE vocal cords and not ejected out Pharyngeal- Nectar Straw -- Pharyngeal -- Pharyngeal- Thin Teaspoon Delayed swallow initiation-pyriform sinuses Pharyngeal -- Pharyngeal- Thin Cup Delayed swallow initiation-pyriform sinuses;Penetration/Aspiration during swallow Pharyngeal Material enters airway, passes  BELOW cords without attempt by patient to eject out (silent aspiration) Pharyngeal- Thin Straw -- Pharyngeal -- Pharyngeal- Puree Delayed swallow initiation-vallecula Pharyngeal -- Pharyngeal- Mechanical Soft -- Pharyngeal -- Pharyngeal- Regular -- Pharyngeal -- Pharyngeal- Multi-consistency Delayed swallow initiation-vallecula Pharyngeal -- Pharyngeal- Pill -- Pharyngeal -- Pharyngeal Comment --  CHL IP CERVICAL ESOPHAGEAL PHASE 07/01/2016 Cervical Esophageal Phase WFL Pudding Teaspoon -- Pudding Cup -- Honey Teaspoon -- Honey Cup -- Nectar Teaspoon -- Nectar Cup -- Nectar Straw -- Thin Teaspoon -- Thin Cup -- Thin Straw -- Puree -- Mechanical Soft -- Regular -- Multi-consistency -- Pill -- Cervical Esophageal Comment -- Completed by Clovis Cao, MA CCC-SLP Note populated by Herbie [place], MA CCC-SLP [phone] in her absence.               Assessment/Plan  Patient has  worked with PT and OT here. He also was seen by SLP team. he has made some improvement in strength. He had adjustment to his levemir dosing. He is now on regular texture food and thin liquid. He has a daughter involved in his care. He will need to see PCP for workup for his cognitive impairment and diabetes management.    Patient is being discharged with home health services:  Home health PT, OT, RN, HHA for strengthening exercises, blood sugar monitoring and assistance with his ADLs.   Patient is being discharged with the following durable medical equipment:  none. He has a walker, tub shower and high toilet at home  Patient has been advised to f/u with their PCP in 1-2 weeks to bring them up to date on their rehab stay.  They were provided with a 30 day supply of scripts for prescription medications and refills must be obtained from their PCP.    Plan of care discussed with patient and verbalizes understanding this. Reviewed care plan with nursing staff       Blanchie Serve, MD Internal Medicine [place], [place] [address] Cell Phone (Monday-Friday 8 am - 5 pm): [phone] On Call: [phone] and follow prompts after 5 pm and on weekends Office Phone: [phone] Office Fax: [phone]

## 2016-07-20 ENCOUNTER — Encounter: Payer: Self-pay | Admitting: *Deleted

## 2016-08-07 ENCOUNTER — Ambulatory Visit: Payer: Medicare Other | Admitting: Internal Medicine

## 2017-05-09 ENCOUNTER — Inpatient Hospital Stay (HOSPITAL_COMMUNITY)
Admission: EM | Admit: 2017-05-09 | Discharge: 2017-05-15 | DRG: 190 | Disposition: A | Discharge status: Home / Self Care | Payer: Medicare Other | Attending: Family Medicine | Admitting: Family Medicine

## 2017-05-09 ENCOUNTER — Encounter (HOSPITAL_COMMUNITY): Payer: Self-pay

## 2017-05-09 ENCOUNTER — Emergency Department (HOSPITAL_COMMUNITY): Payer: Medicare Other

## 2017-05-09 DIAGNOSIS — N183 Chronic kidney disease, stage 3 (moderate): Secondary | ICD-10-CM | POA: Diagnosis present

## 2017-05-09 DIAGNOSIS — Z794 Long term (current) use of insulin: Secondary | ICD-10-CM

## 2017-05-09 DIAGNOSIS — Z7982 Long term (current) use of aspirin: Secondary | ICD-10-CM

## 2017-05-09 DIAGNOSIS — E875 Hyperkalemia: Secondary | ICD-10-CM | POA: Diagnosis not present

## 2017-05-09 DIAGNOSIS — E669 Obesity, unspecified: Secondary | ICD-10-CM | POA: Diagnosis present

## 2017-05-09 DIAGNOSIS — E1165 Type 2 diabetes mellitus with hyperglycemia: Secondary | ICD-10-CM

## 2017-05-09 DIAGNOSIS — N189 Chronic kidney disease, unspecified: Secondary | ICD-10-CM | POA: Diagnosis not present

## 2017-05-09 DIAGNOSIS — I447 Left bundle-branch block, unspecified: Secondary | ICD-10-CM | POA: Diagnosis present

## 2017-05-09 DIAGNOSIS — Z6832 Body mass index (BMI) 32.0-32.9, adult: Secondary | ICD-10-CM

## 2017-05-09 DIAGNOSIS — E1129 Type 2 diabetes mellitus with other diabetic kidney complication: Secondary | ICD-10-CM | POA: Diagnosis not present

## 2017-05-09 DIAGNOSIS — I255 Ischemic cardiomyopathy: Secondary | ICD-10-CM | POA: Diagnosis present

## 2017-05-09 DIAGNOSIS — Z79899 Other long term (current) drug therapy: Secondary | ICD-10-CM

## 2017-05-09 DIAGNOSIS — I248 Other forms of acute ischemic heart disease: Secondary | ICD-10-CM | POA: Diagnosis not present

## 2017-05-09 DIAGNOSIS — J441 Chronic obstructive pulmonary disease with (acute) exacerbation: Secondary | ICD-10-CM | POA: Diagnosis present

## 2017-05-09 DIAGNOSIS — H409 Unspecified glaucoma: Secondary | ICD-10-CM | POA: Diagnosis present

## 2017-05-09 DIAGNOSIS — T380X5A Adverse effect of glucocorticoids and synthetic analogues, initial encounter: Secondary | ICD-10-CM | POA: Diagnosis not present

## 2017-05-09 DIAGNOSIS — I5023 Acute on chronic systolic (congestive) heart failure: Secondary | ICD-10-CM | POA: Diagnosis present

## 2017-05-09 DIAGNOSIS — Z87891 Personal history of nicotine dependence: Secondary | ICD-10-CM | POA: Diagnosis not present

## 2017-05-09 DIAGNOSIS — I2489 Other forms of acute ischemic heart disease: Secondary | ICD-10-CM

## 2017-05-09 DIAGNOSIS — I340 Nonrheumatic mitral (valve) insufficiency: Secondary | ICD-10-CM | POA: Diagnosis not present

## 2017-05-09 DIAGNOSIS — Z8673 Personal history of transient ischemic attack (TIA), and cerebral infarction without residual deficits: Secondary | ICD-10-CM | POA: Diagnosis not present

## 2017-05-09 DIAGNOSIS — I429 Cardiomyopathy, unspecified: Secondary | ICD-10-CM

## 2017-05-09 DIAGNOSIS — Z833 Family history of diabetes mellitus: Secondary | ICD-10-CM | POA: Diagnosis not present

## 2017-05-09 DIAGNOSIS — I4892 Unspecified atrial flutter: Secondary | ICD-10-CM | POA: Diagnosis present

## 2017-05-09 DIAGNOSIS — R062 Wheezing: Secondary | ICD-10-CM | POA: Diagnosis not present

## 2017-05-09 DIAGNOSIS — J9811 Atelectasis: Secondary | ICD-10-CM | POA: Diagnosis present

## 2017-05-09 DIAGNOSIS — Z7902 Long term (current) use of antithrombotics/antiplatelets: Secondary | ICD-10-CM | POA: Diagnosis not present

## 2017-05-09 DIAGNOSIS — R251 Tremor, unspecified: Secondary | ICD-10-CM | POA: Diagnosis present

## 2017-05-09 DIAGNOSIS — I428 Other cardiomyopathies: Secondary | ICD-10-CM | POA: Diagnosis not present

## 2017-05-09 DIAGNOSIS — J45909 Unspecified asthma, uncomplicated: Secondary | ICD-10-CM

## 2017-05-09 DIAGNOSIS — I5043 Acute on chronic combined systolic (congestive) and diastolic (congestive) heart failure: Secondary | ICD-10-CM | POA: Diagnosis not present

## 2017-05-09 DIAGNOSIS — E1122 Type 2 diabetes mellitus with diabetic chronic kidney disease: Secondary | ICD-10-CM | POA: Diagnosis present

## 2017-05-09 DIAGNOSIS — N179 Acute kidney failure, unspecified: Secondary | ICD-10-CM | POA: Diagnosis present

## 2017-05-09 DIAGNOSIS — Z8546 Personal history of malignant neoplasm of prostate: Secondary | ICD-10-CM

## 2017-05-09 DIAGNOSIS — I420 Dilated cardiomyopathy: Secondary | ICD-10-CM | POA: Diagnosis not present

## 2017-05-09 DIAGNOSIS — I4891 Unspecified atrial fibrillation: Secondary | ICD-10-CM | POA: Diagnosis not present

## 2017-05-09 HISTORY — DX: Chronic obstructive pulmonary disease, unspecified: J44.9

## 2017-05-09 HISTORY — DX: Personal history of nicotine dependence: Z87.891

## 2017-05-09 HISTORY — DX: Cardiomyopathy, unspecified: I42.9

## 2017-05-09 HISTORY — DX: Type 2 diabetes mellitus without complications: E11.9

## 2017-05-09 HISTORY — DX: Left bundle-branch block, unspecified: I44.7

## 2017-05-09 LAB — COMPREHENSIVE METABOLIC PANEL
ALT: 10 U/L — ABNORMAL LOW (ref 17–63)
AST: 14 U/L — ABNORMAL LOW (ref 15–41)
Albumin: 3.3 g/dL — ABNORMAL LOW (ref 3.5–5.0)
Alkaline Phosphatase: 77 U/L (ref 38–126)
Anion gap: 7 (ref 5–15)
BUN: 22 mg/dL — ABNORMAL HIGH (ref 6–20)
CHLORIDE: 107 mmol/L (ref 101–111)
CO2: 22 mmol/L (ref 22–32)
Calcium: 8.8 mg/dL — ABNORMAL LOW (ref 8.9–10.3)
Creatinine, Ser: 1.58 mg/dL — ABNORMAL HIGH (ref 0.61–1.24)
GFR, EST AFRICAN AMERICAN: 44 mL/min — AB (ref >60)
GFR, EST NON AFRICAN AMERICAN: 38 mL/min — AB (ref >60)
Glucose, Bld: 268 mg/dL — ABNORMAL HIGH (ref 65–99)
POTASSIUM: 5 mmol/L (ref 3.5–5.1)
SODIUM: 136 mmol/L (ref 135–145)
Total Bilirubin: 0.9 mg/dL (ref 0.3–1.2)
Total Protein: 6.4 g/dL — ABNORMAL LOW (ref 6.5–8.1)

## 2017-05-09 LAB — CBC WITH DIFFERENTIAL/PLATELET
BASOS ABS: 0 10*3/uL (ref 0.0–0.1)
Basophils Relative: 0 %
EOS ABS: 0.3 10*3/uL (ref 0.0–0.7)
EOS PCT: 3 %
HCT: 36.4 % — ABNORMAL LOW (ref 39.0–52.0)
Hemoglobin: 11.4 g/dL — ABNORMAL LOW (ref 13.0–17.0)
LYMPHS ABS: 2.1 10*3/uL (ref 0.7–4.0)
LYMPHS PCT: 27 %
MCH: 28.3 pg (ref 26.0–34.0)
MCHC: 31.3 g/dL (ref 30.0–36.0)
MCV: 90.3 fL (ref 78.0–100.0)
MONO ABS: 1 10*3/uL (ref 0.1–1.0)
Monocytes Relative: 13 %
Neutro Abs: 4.2 10*3/uL (ref 1.7–7.7)
Neutrophils Relative %: 57 %
Platelets: 136 10*3/uL — ABNORMAL LOW (ref 150–400)
RBC: 4.03 MIL/uL — ABNORMAL LOW (ref 4.22–5.81)
RDW: 14 % (ref 11.5–15.5)
WBC: 7.5 10*3/uL (ref 4.0–10.5)

## 2017-05-09 LAB — D-DIMER, QUANTITATIVE (NOT AT ARMC): D DIMER QUANT: 0.7 ug{FEU}/mL — AB (ref 0.00–0.50)

## 2017-05-09 LAB — I-STAT TROPONIN, ED: TROPONIN I, POC: 0.01 ng/mL (ref 0.00–0.08)

## 2017-05-09 LAB — I-STAT CG4 LACTIC ACID, ED: LACTIC ACID, VENOUS: 1.02 mmol/L (ref 0.5–1.9)

## 2017-05-09 MED ORDER — METOPROLOL TARTRATE 5 MG/5ML IV SOLN
2.5000 mg | Freq: Once | INTRAVENOUS | Status: AC
  Administered 2017-05-09: 2.5 mg via INTRAVENOUS
  Filled 2017-05-09: qty 5

## 2017-05-09 MED ORDER — IOPAMIDOL (ISOVUE-370) INJECTION 76%
INTRAVENOUS | Status: AC
  Administered 2017-05-09: 80 mL
  Filled 2017-05-09: qty 100

## 2017-05-09 MED ORDER — DILTIAZEM HCL 100 MG IV SOLR
5.0000 mg/h | Freq: Once | INTRAVENOUS | Status: AC
  Administered 2017-05-09: 5 mg/h via INTRAVENOUS
  Filled 2017-05-09: qty 100

## 2017-05-09 MED ORDER — DILTIAZEM HCL 100 MG IV SOLR
INTRAVENOUS | Status: AC
  Administered 2017-05-09: 15 mg/h
  Filled 2017-05-09: qty 100

## 2017-05-09 MED ORDER — SODIUM CHLORIDE 0.9 % IV BOLUS (SEPSIS)
500.0000 mL | Freq: Once | INTRAVENOUS | Status: AC
  Administered 2017-05-09: 500 mL via INTRAVENOUS

## 2017-05-09 MED ORDER — METHYLPREDNISOLONE SODIUM SUCC 125 MG IJ SOLR
125.0000 mg | Freq: Once | INTRAMUSCULAR | Status: AC
  Administered 2017-05-09: 125 mg via INTRAVENOUS
  Filled 2017-05-09: qty 2

## 2017-05-09 MED ORDER — HEPARIN BOLUS VIA INFUSION
4500.0000 [IU] | Freq: Once | INTRAVENOUS | Status: AC
  Administered 2017-05-09: 4500 [IU] via INTRAVENOUS
  Filled 2017-05-09: qty 4500

## 2017-05-09 MED ORDER — METOPROLOL TARTRATE 5 MG/5ML IV SOLN: 2.5000 mg | INTRAVENOUS | Status: DC | PRN

## 2017-05-09 MED ORDER — IPRATROPIUM-ALBUTEROL 0.5-2.5 (3) MG/3ML IN SOLN
3.0000 mL | Freq: Once | RESPIRATORY_TRACT | Status: AC
  Administered 2017-05-09: 3 mL via RESPIRATORY_TRACT
  Filled 2017-05-09: qty 3

## 2017-05-09 MED ORDER — HEPARIN (PORCINE) IN NACL 100-0.45 UNIT/ML-% IJ SOLN
850.0000 [IU]/h | INTRAMUSCULAR | Status: DC
  Administered 2017-05-09: 1200 [IU]/h via INTRAVENOUS
  Administered 2017-05-10: 1000 [IU]/h via INTRAVENOUS
  Administered 2017-05-11: 750 [IU]/h via INTRAVENOUS
  Administered 2017-05-12: 850 [IU]/h via INTRAVENOUS
  Filled 2017-05-09 (×4): qty 250

## 2017-05-09 NOTE — ED Provider Notes (Signed)
[place] DEPT Provider Note   CSN: [id number] Arrival date & time: 05/09/17  1148     History   Chief Complaint Chief Complaint  Patient presents with  . Shortness of Breath    HPI Dustin Leonard is a 83 y.o. male.  HPI   Patient is an 75 -year-old male presenting with short of breath. Patient's been having increasing shortness breath for the last 3 days. He has had some nasal congestion. Patient has a history of asthma and patient's daughter gave him some medications, do nebs to help. Patient's continued to be short of breath. On arrival here patient is tachycardic with shortness of breath.  Past Medical History:  Diagnosis Date  . Asthma   . Diabetes mellitus without complication ([place])   . Prostate cancer ([place])   . Stroke [facility])     Patient Active Problem List   Diagnosis Date Noted  . COPD exacerbation ([place]) 06/30/2016  . History of TIA (transient ischemic attack) 06/30/2016  . UTI (lower urinary tract infection) 06/30/2016  . Type 2 diabetes mellitus with hyperglycemia, with long-term current use of insulin ([place]) 06/30/2016  . Stroke ([place]) 09/22/2014  . Cardiomyopathy, ischemic 04/13/2013  . GLAUCOMA 12/02/2007  . 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 PROSTATE CANCER, HX OF 12/02/2007    Past Surgical History:  Procedure Laterality Date  . PROSTATE SURGERY         Home Medications    Prior to Admission medications   Medication Sig Start Date End Date Taking? Authorizing Provider  albuterol (PROVENTIL HFA;VENTOLIN HFA) 108 (90 Base) MCG/ACT inhaler Inhale 2 puffs into the lungs every 6 (six) hours as needed for wheezing or shortness of breath.    [provider]  aspirin 81 MG chewable tablet Chew 81 mg by mouth daily.    [provider]  carvedilol (COREG) 3.125 MG tablet Take 3.125 mg by mouth 2 (two) times daily with a meal.  12/26/11   [provider]  Cholecalciferol 1000 units tablet Take 1,000 Units by mouth daily.     [provider]  clopidogrel (PLAVIX) 75 MG tablet Take 75 mg by mouth daily. 12/26/11   [provider]  furosemide (LASIX) 40 MG tablet Take 40 mg by mouth daily.  01/25/13   [provider]  insulin detemir (LEVEMIR) 100 UNIT/ML injection Inject 24 Units into the skin at bedtime.     [provider]  insulin lispro (HUMALOG) 100 UNIT/ML injection Inject 0-10 Units into the skin 3 (three) times daily before meals. 0-59= hypoglycemic protocol, 60-149= 0 units, 150-250= 5 units, 251-300=8 units, 301-350= 10 units. >350 NOTIFY MD/NP    [provider]  ipratropium-albuterol (DUONEB) 0.5-2.5 (3) MG/3ML SOLN Take 3 mLs by nebulization every 6 (six) hours as needed. 07/03/16   Kalman Shan Ratliff, DO  latanoprost (XALATAN) 0.005 % ophthalmic solution Place 1 drop into both eyes at bedtime.    [provider]  lisinopril (PRINIVIL,ZESTRIL) 40 MG tablet Take 40 mg by mouth daily.    [provider]    Family History Family History  Problem Relation Age of Onset  . Diabetes Maternal Aunt     Social History Social History  Substance Use Topics  . Smoking status: Former Smoker    Types: Cigarettes    Quit date: 10/23/1957  . Smokeless tobacco: Not on file     Comment: Smoked 1/2 ppd x 23 years  . Alcohol use No     Allergies  Patient has no known allergies.   Review of Systems Review of Systems  Constitutional: Negative for activity change.  HENT: Positive for congestion.   Respiratory: Positive for cough and shortness of breath.   Cardiovascular: Negative for chest pain.  Gastrointestinal: Negative for abdominal pain.  Neurological: Negative for syncope and weakness.  All other systems reviewed and are negative.    Physical Exam Updated Vital Signs BP 119/78   Pulse (!) 135   Temp 98.3 F (36.8 C) (Oral)   Resp (!) 23   Ht 5\' 6"  (1.676 m)   Wt 90.7 kg (200 lb)   SpO2 98%   BMI 32.28 kg/m   Physical Exam    Constitutional: He is oriented to person, place, and time. He appears well-nourished.  HENT:  Head: Normocephalic.  Eyes: Conjunctivae are normal.  Neck: Normal range of motion.  Cardiovascular: Normal heart sounds.   No murmur heard. tachycardic  Pulmonary/Chest: No respiratory distress. He has wheezes.  Increased WOB  Abdominal: Soft. He exhibits no distension. There is no tenderness.  Musculoskeletal: He exhibits edema.  Neurological: He is oriented to person, place, and time.  Skin: Skin is warm and dry. He is not diaphoretic.  Psychiatric: He has a normal mood and affect. His behavior is normal.     ED Treatments / Results  Labs (all labs ordered are listed, but only abnormal results are displayed) Labs Reviewed  CBC WITH DIFFERENTIAL/PLATELET - Abnormal; Notable for the following:       Result Value   RBC 4.03 (*)    Hemoglobin 11.4 (*)    HCT 36.4 (*)    Platelets 136 (*)    All other components within normal limits  COMPREHENSIVE METABOLIC PANEL - Abnormal; Notable for the following:    Glucose, Bld 268 (*)    BUN 22 (*)    Creatinine, Ser 1.58 (*)    Calcium 8.8 (*)    Total Protein 6.4 (*)    Albumin 3.3 (*)    AST 14 (*)    ALT 10 (*)    GFR calc non Af Amer 38 (*)    GFR calc Af Amer 44 (*)    All other components within normal limits  D-DIMER, QUANTITATIVE (NOT AT [facility]) - Abnormal; Notable for the following:    D-Dimer, Quant 0.70 (*)    All other components within normal limits  I-STAT TROPOININ, ED  I-STAT CG4 LACTIC ACID, ED    EKG  EKG Interpretation  Date/Time:  Wednesday May 09 2017 12:07:07 EDT Ventricular Rate:  137 PR Interval:    QRS Duration: 134 QT Interval:  311 QTC Calculation: 470 R Axis:   -115 Text Interpretation:  Ectopic atrial tachycardia, unifocal Left bundle branch block Left bundle branch block Confirmed by Thomasene Lot, Vegas Coffin [phone]) on 05/09/2017 12:17:55 PM       Radiology Dg Chest Portable 1 View  Result Date:  05/09/2017 CLINICAL DATA:  short of breath EXAM: PORTABLE CHEST 1 VIEW COMPARISON:  07/01/2016 FINDINGS: Cardiac enlargement. Negative for heart failure. Mild bibasilar atelectasis. Negative for pleural effusion IMPRESSION: Cardiac enlargement with bibasilar atelectasis Atherosclerotic aorta Electronically Signed   By: Franchot Gallo M.D.   On: 05/09/2017 12:27    Procedures Procedures (including critical care time)  Medications Ordered in ED Medications  sodium chloride 0.9 % bolus 500 mL (500 mLs Intravenous New Bag/Given 05/09/17 1220)  ipratropium-albuterol (DUONEB) 0.5-2.5 (3) MG/3ML nebulizer solution 3 mL (3 mLs Nebulization Given 05/09/17 1308)  methylPREDNISolone sodium  succinate (SOLU-MEDROL) 125 mg/2 mL injection 125 mg (125 mg Intravenous Given 05/09/17 1308)     Initial Impression / Assessment and Plan / ED Course  I have reviewed the triage vital signs and the nursing notes.  Pertinent labs & imaging results that were available during my care of the patient were reviewed by me and considered in my medical decision making (see chart for details).     Patient is an 49 -year-old male presenting with short of breath. Patient's been having increasing shortness breath for the last 3 days. He has had some nasal congestion. Patient has a history of asthma and patient's daughter gave him some medications, do nebs to help. Patient's continued to be short of breath. On arrival here patient is tachycardic with shortness of breath, only scattered wheezing.   1:27 PM Pt EKG with LBBB tachy to 137.  Discussed with cards 2:1 afib.  Will treat with beta blocker.  With only scattered whezins, will get d dimer for cuase of SOB.  Beta blocker helped only shorly and he has low BP, so will do dilt drip.   2:22 PM   Patient now having increasing wheezing, so I think it is now likely COPD as a cause for SOB.   Will admit to hospitlists for COPD exacerbation with new onset afib.   Given + d  dimer, will await PE.    CRITICAL CARE Performed by: Gardiner Sleeper Total critical care time: 60 minutes Critical care time was exclusive of separately billable procedures and treating other patients. Critical care was necessary to treat or prevent imminent or life-threatening deterioration. Critical care was time spent personally by me on the following activities: development of treatment plan with patient and/or surrogate as well as nursing, discussions with consultants, evaluation of patient's response to treatment, examination of patient, obtaining history from patient or surrogate, ordering and performing treatments and interventions, ordering and review of laboratory studies, ordering and review of radiographic studies, pulse oximetry and re-evaluation of patient's condition.  .  Final Clinical Impressions(s) / ED Diagnoses   Final diagnoses:  None    New Prescriptions New Prescriptions   No medications on file     Macarthur Critchley, MD 05/10/17 [phone]

## 2017-05-09 NOTE — ED Notes (Addendum)
Called CT to see about patient's scan. Pt is next

## 2017-05-09 NOTE — ED Notes (Addendum)
MD Pickering at the bedside. Heart healthy Meal Tray ordered per MD approval

## 2017-05-09 NOTE — ED Notes (Signed)
Pt taken to CT.

## 2017-05-09 NOTE — ED Triage Notes (Signed)
Pt brought in by EMS due to having worsening SOB. Pt has hx of asthma. Per EMS, pt was in a.flutter. Pt does not have hx of a.flutter.   BP 125/85 HR136 RR 32 99% RA

## 2017-05-09 NOTE — ED Provider Notes (Signed)
  Physical Exam  BP 123/75   Pulse (!) 134   Temp 98.3 F (36.8 C) (Oral)   Resp (!) 23   Ht 5\' 6"  (1.676 m)   Wt 90.7 kg (200 lb)   SpO2 97%   BMI 32.28 kg/m   Physical Exam  ED Course  Procedures  MDM Patient presented with new onset atrial flutter. Unknown time of onset. D-dimer elevated. CT scan does not show pulmonary embolism. Still some wheezes. Admit to family practice as an unassigned patient.       Davonna Belling, MD 05/09/17 [phone]

## 2017-05-09 NOTE — ED Notes (Signed)
Phlebotomy at the bedside  

## 2017-05-09 NOTE — Progress Notes (Signed)
ANTICOAGULATION CONSULT NOTE - Initial Consult  Pharmacy Consult for heparin Indication: atrial fibrillation  No Known Allergies  Patient Measurements: Height: 5\' 6"  (167.6 cm) Weight: 200 lb (90.7 kg) IBW/kg (Calculated) : 63.8 Heparin Dosing Weight: 83kg  Vital Signs: Temp: 98.3 F (36.8 C) (07/18 1156) Temp Source: Oral (07/18 1156) BP: 128/84 (07/18 2000) Pulse Rate: 121 (07/18 2000)  Labs:  Recent Labs  05/09/17 1212  HGB 11.4*  HCT 36.4*  PLT 136*  CREATININE 1.58*    Estimated Creatinine Clearance: 35.4 mL/min (A) (by C-G formula based on SCr of 1.58 mg/dL (H)).   Medical History: Past Medical History:  Diagnosis Date  . Asthma   . Diabetes mellitus without complication ([place])   . Prostate cancer ([place])   . Stroke [facility])     Medications:  Infusions:  . heparin      Assessment: 65 yom presented to the hospital with SOB and new aflutter. To start IV heparin. Baseline H/H and platelets are slightly low. CT negative for PE. He is not on anticoagulation PTA.   Goal of Therapy:  Heparin level 0.3-0.7 units/ml Monitor platelets by anticoagulation protocol: Yes   Plan:  Heparin bolus 4500 units IV x 1 Heparin gtt 1200 units/hr Check an 8 hr heparin level Daily heparin level and CBC  Antrone Walla, Rande Lawman 05/09/2017,8:06 PM

## 2017-05-09 NOTE — H&P (Signed)
[facility] Admission History and Physical Service Pager: [phone]  Patient name: Dustin Leonard Medical record number: [id number] Date of birth: [birth year] Age: 83 y.o. Gender: male  Primary Care Provider: Katherina Mires, MD Consultants: none Code Status: Full   Chief Complaint: SOB   Assessment and Plan: Dustin Leonard is a 83 y.o. male presenting with SOB x 1 week . PMH is significant for glaucoma, COPD, prostate cancer, stroke, TIA, and T2DM.   SOB Patient complains of SOB x 1 week. Patient states some cough but no sputum production. Patient endorses wheezing. Tachycardic on admission with HR of 131. Patient has been using duonebs treatments up to 3 times per day for past week along with albuterol inhaler up to 2 times per day. Patient reports 1 pillow orthopnea and PND. Likely a combination of COPD exacerbation and fluid overload. Patient had bilateral expiratory wheezing and slight crackles on exam. Patient had 1+ pitting edema, unsure if he took morning lasix today. CXR showed cardiac enlargement with bibasilar atelectasis and atherosclerotic aorta. CT angio showed no evident pulmonary embolus, prominence of the main pulmonary outflow tract indicating possible pulmonary arterial hypertension, no aortic aneurysm, findings representative of a degree of congestive heart failure, no appreciable adenopathy, and slight reflux of contrast into the inferior vena cava and hepatic veins indicating a degree of increased right heart pressure. Received solu medrol and duoneb treatment in ED. According to patient, he has no known history of CHF.  Had elevated BNP to 300 in Sept 2017. From chart review, echo obtained during admission for CVA in 2012 showed EF 30-35% and diffuse hypokinesis. Patient reports he has not been followed by cardiology. Unable to find results of a repeat echo. Patient reports he takes his Lasix 40 mg PO daily for chronic LE edema. Low suspicion for  infectious process given lack of fever, lack of leukocytosis, and no infiltrate on CXR.  -admit to telemetry, attending Dr. Ardelia Mems  -continue to monitor  -echocardiogram and BNP  -continuous pulse ox -lasix 40 mg IV x1 dose, re-assess volume status in AM  -ipratropium q6h  -levalbuterol q6h/1h prn (avoiding albuterol secondary to tachycardia)  -levofloxacin 750 mg  -dulera bid, initiating controller medication as patient had hospitalization for COPD exacerbation within the past year  -prednisone 40 mg daily with breakfast starting tomorrow AM, s/p Solumedrol in ED   New onset Atrial flutter  Patient presented to ED with new onset atrial flutter. HR of 131. Was put on Cardizem drip in ED. Tachycardia likely worsened in the setting of albuterol treatment. CHADSVASC score of 6. No history of bleeding.  -continue Cardizem drip -continue to monitor  -echocardiogram -am EKG -heparin gtt per pharmacy  -cardiology consult in AM  -will obtain TSH and troponins to evaluate for potential causes of heart arrhythmia    AKI: Cr 1.58 (bl 0.9-1.0). May be pre-renal as concern that patient is currently volume overloaded.  -monitor with diuresis  -consider urine studies if no improvement  -holding Ramipril    COPD Patient has history of COPD. Was followed by Dr. Annamaria Boots, pulmonologist, several years ago but has not had follow up recent due to no recent SOB. CXR showed cardiac enlargement with bibasilar atelectasis and atherosclerotic aorta. -continue to monitor  -breathing treatments as above   T2DM Patient on home levamir 47 units prn if glucose over 190. Last hemoglobin A1c 10 (Sept 2017).  -levamir 20 units daily  -sliding scale  -CBGs AC/HS  -Hemoglobin A1c   History  of CVA: Reportedly without residual deficits.  -continue ASA 81 mg  -care everywhere PCP note states he is on Plavix and a statin, confirm with pharmacy in AM and restart as needed   FEN/GI: carb modified diet   Prophylaxis: heparin   Disposition: admit to telemetry, attending Dr. Ardelia Mems   History of Present Illness:  Dustin Leonard is a 83 y.o. male presenting with SOB x 1 week. SOB has been getting progressively worse. Endorses dry cough and wheezing. Has been using duonebs at home for the past week an average of three times per day. Has used Albuterol inhaler about twice per day. Patient finds it difficult to lay flat and has been sleeping on one pillow usually but for the past week has been using two pillows. Endorses PND. Used to see Dr. Annamaria Boots, pulmonologist, several years ago but has not had follow up recent due to no recent SOB. Not on any controller medications. Patient presented with new onset atrial flutter in ED, but denies symptoms of chest pain or palpitations. Patient does not see a cardiologist and has no history of abnormal heart rhythm. No history of GI bleed. Patient denies headache or dizziness.   In ED, patient was given Solumedrol and Duoneb for his dyspnea. He noted improvement in his respiratory status for this treatment. For Atrial Flutter with tachycardia he was given Metoprolol. His heart rate did not improve, so he was started on a Diltiazem drip.   Review Of Systems: Per HPI with the following additions: No chest pain. No palpitations or sensation of racing heart. No headaches or dizziness.   Review of Systems  Respiratory: Positive for cough, shortness of breath and wheezing. Negative for sputum production.   Cardiovascular: Negative for chest pain and palpitations.  Neurological: Negative for dizziness and headaches.    Patient Active Problem List   Diagnosis Date Noted  . Atrial fibrillation ([place]) 05/09/2017  . COPD exacerbation ([place]) 06/30/2016  . History of TIA (transient ischemic attack) 06/30/2016  . UTI (lower urinary tract infection) 06/30/2016  . Type 2 diabetes mellitus with hyperglycemia, with long-term current use of insulin ([place]) 06/30/2016  . Stroke ([place])  09/22/2014  . Cardiomyopathy, ischemic 04/13/2013  . GLAUCOMA 12/02/2007  . COPD with emphysema ([place]) 12/02/2007  . PROSTATE CANCER, HX OF 12/02/2007    Past Medical History: Past Medical History:  Diagnosis Date  . Asthma   . Diabetes mellitus without complication ([place])   . Prostate cancer ([place])   . Stroke [facility])     Past Surgical History: Past Surgical History:  Procedure Laterality Date  . PROSTATE SURGERY      Social History: Social History  Substance Use Topics  . Smoking status: Former Smoker    Types: Cigarettes    Quit date: 10/23/1957  . Smokeless tobacco: Not on file     Comment: Smoked 1/2 ppd x 23 years  . Alcohol use No   Additional social history: Very remote smoking history, quit 40-50 years ago. Lives with wife at home.   Please also refer to relevant sections of EMR.  Family History: Family History  Problem Relation Age of Onset  . Diabetes Maternal Aunt     Allergies and Medications: No Known Allergies No current facility-administered medications on file prior to encounter.    Current Outpatient Prescriptions on File Prior to Encounter  Medication Sig Dispense Refill  . albuterol (PROVENTIL HFA;VENTOLIN HFA) 108 (90 Base) MCG/ACT inhaler Inhale 2 puffs into the lungs every 6 (six) hours as  needed for wheezing or shortness of breath.    Marland Kitchen aspirin 81 MG chewable tablet Chew 81 mg by mouth daily.    . Cholecalciferol 1000 units tablet Take 1,000 Units by mouth daily.    . furosemide (LASIX) 40 MG tablet Take 40 mg by mouth daily.     . insulin detemir (LEVEMIR) 100 UNIT/ML injection Inject 47 Units into the skin See admin instructions. Use 47 units as needed after blood sugar check. If the measure is over 190.    . ipratropium-albuterol (DUONEB) 0.5-2.5 (3) MG/3ML SOLN Take 3 mLs by nebulization every 6 (six) hours as needed. (Patient taking differently: Take 3 mLs by nebulization every 6 (six) hours as needed (Short of breath or wheezing). ) 360 mL 1     Objective: BP 130/80   Pulse (!) 131   Temp 98.3 F (36.8 C) (Oral)   Resp (!) 22   Ht 5\' 6"  (1.676 m)   Wt 200 lb (90.7 kg)   SpO2 97%   BMI 32.28 kg/m  Exam: General: Patient is awake and alert, sitting up in bed  Eyes: PERRL, EOMI ENTM: moist mucous membranes  Neck: non tender, normal range of motion, no appreciable JVD  Cardiovascular: tachycardic, episodes of irregularity, no MRG Respiratory: expiratory wheezes heard throughout lungs bilaterally, slight crackles at bases bilaterally, no accessory muscle use, slightly increased work of breathing  Gastrointestinal: soft, non tender, distended, bowel sounds x 4 quadrants  MSK: 1+ pitting edema in lower extremities bilaterally, 5/5 muscle strength  Derm: skin intact Neuro: sensation intact bilaterally, 5/5 muscle strength in all extremities  Psych: normal affect   Labs and Imaging: CBC BMET   Recent Labs Lab 05/09/17 1212  WBC 7.5  HGB 11.4*  HCT 36.4*  PLT 136*    Recent Labs Lab 05/09/17 1212  NA 136  K 5.0  CL 107  CO2 22  BUN 22*  CREATININE 1.58*  GLUCOSE 268*  CALCIUM 8.8*     Ct Angio Chest Pe W And/or Wo Contrast  Result Date: 05/09/2017 CLINICAL DATA:  Shortness of Breath EXAM: CT ANGIOGRAPHY CHEST WITH CONTRAST TECHNIQUE: Multidetector CT imaging of the chest was performed using the standard protocol during bolus administration of intravenous contrast. Multiplanar CT image reconstructions and MIPs were obtained to evaluate the vascular anatomy. CONTRAST:  80 mL Isovue 370 nonionic COMPARISON:  Chest radiograph May 09, 2017 FINDINGS: Cardiovascular: There is no demonstrable pulmonary embolus. The main pulmonary outflow tract measures 3.1 cm in diameter, a finding felt to be indicative of a degree of pulmonary arterial hypertension. There is no appreciable thoracic aortic aneurysm. While no dissection of the aorta is seen, the contrast bolus was not timed to allow for confident assessment of the  thoracic aorta for potential dissection. There are foci of calcification in the proximal visualized great vessels. There are multiple foci of atherosclerotic calcification in the aorta. There are foci of calcification in several coronary arteries. The pericardium is not appreciably thickened. Mediastinum/Nodes: Thyroid appears unremarkable. There is no appreciable thoracic adenopathy. Scattered subcentimeter mediastinal lymph nodes are noted. There is a small hiatal hernia. Lungs/Pleura: There is scarring in the right apex. There are pleural effusions tracking along each major fissure. There is patchy bibasilar atelectatic change. There is mild interstitial edema in the lower lung zones. There is no frank airspace consolidation. Upper Abdomen: There is atherosclerotic calcification in the aorta. There is cholelithiasis. There is slight reflux of contrast into the inferior vena cava with trace contrast  refluxed into the hepatic veins. Musculoskeletal: There is midthoracic dextroscoliosis. There is degenerative change in the thoracic spine. There are no blastic or lytic bone lesions. Review of the MIP images confirms the above findings. IMPRESSION: 1. No evident pulmonary embolus. Prominence of the main pulmonary outflow tract may indicate a degree of pulmonary arterial hypertension. 2. No thoracic aortic aneurysm. No dissection evident. Note that the contrast bolus is not timed to optimize assessment for potential thoracic aortic dissection. There are foci of atherosclerotic calcification in the aorta and great vessels. There are foci of coronary artery calcification. 3. Findings felt to represent a degree of congestive heart failure. No airspace consolidation. 4.  No appreciable adenopathy. 5. Slight reflux of contrast into the inferior vena cava and hepatic veins may indicate a degree of increase in right heart pressure. 6.  Cholelithiasis. Aortic Atherosclerosis (ICD10-I70.0). Electronically Signed   By: Lowella Grip III M.D.   On: 05/09/2017 16:20   Dg Chest Portable 1 View  Result Date: 05/09/2017 CLINICAL DATA:  short of breath EXAM: PORTABLE CHEST 1 VIEW COMPARISON:  07/01/2016 FINDINGS: Cardiac enlargement. Negative for heart failure. Mild bibasilar atelectasis. Negative for pleural effusion IMPRESSION: Cardiac enlargement with bibasilar atelectasis Atherosclerotic aorta Electronically Signed   By: Franchot Gallo M.D.   On: 05/09/2017 12:27     Caroline More, DO 05/09/2017, 8:21 PM PGY-1, [place] Intern pager: [phone], text pages welcome  Upper Level Addendum:  I have seen and evaluated this patient along with Dr. Tammi Klippel and reviewed the above note, making necessary revisions in green.   Phill Myron, D.O. 05/09/2017, 9:29 PM PGY-3, [place]

## 2017-05-10 ENCOUNTER — Inpatient Hospital Stay (HOSPITAL_COMMUNITY): Payer: Medicare Other

## 2017-05-10 ENCOUNTER — Encounter (HOSPITAL_COMMUNITY): Payer: Self-pay

## 2017-05-10 DIAGNOSIS — I5043 Acute on chronic combined systolic (congestive) and diastolic (congestive) heart failure: Secondary | ICD-10-CM

## 2017-05-10 DIAGNOSIS — I248 Other forms of acute ischemic heart disease: Secondary | ICD-10-CM

## 2017-05-10 DIAGNOSIS — J45909 Unspecified asthma, uncomplicated: Secondary | ICD-10-CM

## 2017-05-10 DIAGNOSIS — I429 Cardiomyopathy, unspecified: Secondary | ICD-10-CM

## 2017-05-10 DIAGNOSIS — J441 Chronic obstructive pulmonary disease with (acute) exacerbation: Principal | ICD-10-CM

## 2017-05-10 DIAGNOSIS — I4891 Unspecified atrial fibrillation: Secondary | ICD-10-CM

## 2017-05-10 DIAGNOSIS — I340 Nonrheumatic mitral (valve) insufficiency: Secondary | ICD-10-CM

## 2017-05-10 DIAGNOSIS — N179 Acute kidney failure, unspecified: Secondary | ICD-10-CM

## 2017-05-10 DIAGNOSIS — R062 Wheezing: Secondary | ICD-10-CM

## 2017-05-10 DIAGNOSIS — Z8673 Personal history of transient ischemic attack (TIA), and cerebral infarction without residual deficits: Secondary | ICD-10-CM

## 2017-05-10 DIAGNOSIS — I447 Left bundle-branch block, unspecified: Secondary | ICD-10-CM

## 2017-05-10 DIAGNOSIS — N189 Chronic kidney disease, unspecified: Secondary | ICD-10-CM

## 2017-05-10 DIAGNOSIS — I428 Other cardiomyopathies: Secondary | ICD-10-CM

## 2017-05-10 LAB — BASIC METABOLIC PANEL
Anion gap: 10 (ref 5–15)
BUN: 34 mg/dL — AB (ref 6–20)
CALCIUM: 8.5 mg/dL — AB (ref 8.9–10.3)
CO2: 18 mmol/L — ABNORMAL LOW (ref 22–32)
CREATININE: 1.69 mg/dL — AB (ref 0.61–1.24)
Chloride: 106 mmol/L (ref 101–111)
GFR calc non Af Amer: 35 mL/min — ABNORMAL LOW (ref >60)
GFR, EST AFRICAN AMERICAN: 41 mL/min — AB (ref >60)
Glucose, Bld: 515 mg/dL (ref 65–99)
Potassium: 5.1 mmol/L (ref 3.5–5.1)
SODIUM: 134 mmol/L — AB (ref 135–145)

## 2017-05-10 LAB — GLUCOSE, CAPILLARY
GLUCOSE-CAPILLARY: 470 mg/dL — AB (ref 65–99)
GLUCOSE-CAPILLARY: 211 mg/dL — AB (ref 65–99)
GLUCOSE-CAPILLARY: 358 mg/dL — AB (ref 65–99)
Glucose-Capillary: 368 mg/dL — ABNORMAL HIGH (ref 65–99)
Glucose-Capillary: 466 mg/dL — ABNORMAL HIGH (ref 65–99)

## 2017-05-10 LAB — HEPARIN LEVEL (UNFRACTIONATED)
HEPARIN UNFRACTIONATED: 1.18 [IU]/mL — AB (ref 0.30–0.70)
Heparin Unfractionated: 1.06 IU/mL — ABNORMAL HIGH (ref 0.30–0.70)

## 2017-05-10 LAB — TROPONIN I
TROPONIN I: 0.03 ng/mL — AB (ref <0.03)
Troponin I: 0.03 ng/mL (ref <0.03)
Troponin I: 0.04 ng/mL (ref <0.03)

## 2017-05-10 LAB — CBC
HEMATOCRIT: 35.2 % — AB (ref 39.0–52.0)
Hemoglobin: 11.2 g/dL — ABNORMAL LOW (ref 13.0–17.0)
MCH: 28.7 pg (ref 26.0–34.0)
MCHC: 31.8 g/dL (ref 30.0–36.0)
MCV: 90.3 fL (ref 78.0–100.0)
PLATELETS: 135 10*3/uL — AB (ref 150–400)
RBC: 3.9 MIL/uL — ABNORMAL LOW (ref 4.22–5.81)
RDW: 13.9 % (ref 11.5–15.5)
WBC: 7.7 10*3/uL (ref 4.0–10.5)

## 2017-05-10 LAB — BRAIN NATRIURETIC PEPTIDE: B Natriuretic Peptide: 590.2 pg/mL — ABNORMAL HIGH (ref 0.0–100.0)

## 2017-05-10 LAB — ECHOCARDIOGRAM COMPLETE
HEIGHTINCHES: 67 in
WEIGHTICAEL: 3297.6 [oz_av]

## 2017-05-10 LAB — TSH: TSH: 0.437 u[IU]/mL (ref 0.350–4.500)

## 2017-05-10 LAB — MRSA PCR SCREENING: MRSA by PCR: NEGATIVE

## 2017-05-10 MED ORDER — INSULIN ASPART 100 UNIT/ML ~~LOC~~ SOLN
10.0000 [IU] | Freq: Once | SUBCUTANEOUS | Status: AC
  Administered 2017-05-10: 10 [IU] via SUBCUTANEOUS

## 2017-05-10 MED ORDER — INSULIN DETEMIR 100 UNIT/ML ~~LOC~~ SOLN
20.0000 [IU] | Freq: Every day | SUBCUTANEOUS | Status: DC
  Administered 2017-05-10: 20 [IU] via SUBCUTANEOUS
  Filled 2017-05-10 (×2): qty 0.2

## 2017-05-10 MED ORDER — IPRATROPIUM BROMIDE 0.02 % IN SOLN
0.5000 mg | Freq: Four times a day (QID) | RESPIRATORY_TRACT | Status: DC
  Administered 2017-05-10 – 2017-05-11 (×6): 0.5 mg via RESPIRATORY_TRACT
  Filled 2017-05-10 (×6): qty 2.5

## 2017-05-10 MED ORDER — LEVALBUTEROL HCL 0.63 MG/3ML IN NEBU
0.6300 mg | INHALATION_SOLUTION | Freq: Four times a day (QID) | RESPIRATORY_TRACT | Status: DC
  Administered 2017-05-10: 0.63 mg via RESPIRATORY_TRACT
  Filled 2017-05-10: qty 3

## 2017-05-10 MED ORDER — IPRATROPIUM BROMIDE 0.02 % IN SOLN
0.5000 mg | Freq: Four times a day (QID) | RESPIRATORY_TRACT | Status: DC
  Administered 2017-05-10: 0.5 mg via RESPIRATORY_TRACT
  Filled 2017-05-10: qty 2.5

## 2017-05-10 MED ORDER — LEVALBUTEROL HCL 0.63 MG/3ML IN NEBU
0.6300 mg | INHALATION_SOLUTION | Freq: Four times a day (QID) | RESPIRATORY_TRACT | Status: DC
  Administered 2017-05-10 – 2017-05-11 (×6): 0.63 mg via RESPIRATORY_TRACT
  Filled 2017-05-10 (×6): qty 3

## 2017-05-10 MED ORDER — LEVALBUTEROL HCL 1.25 MG/0.5ML IN NEBU: 1.2500 mg | INHALATION_SOLUTION | RESPIRATORY_TRACT | Status: DC | PRN

## 2017-05-10 MED ORDER — INSULIN ASPART 100 UNIT/ML ~~LOC~~ SOLN
0.0000 [IU] | Freq: Three times a day (TID) | SUBCUTANEOUS | Status: DC
  Administered 2017-05-10: 3 [IU] via SUBCUTANEOUS
  Administered 2017-05-10 (×2): 9 [IU] via SUBCUTANEOUS
  Administered 2017-05-11: 2 [IU] via SUBCUTANEOUS
  Administered 2017-05-11: 9 [IU] via SUBCUTANEOUS
  Administered 2017-05-11: 3 [IU] via SUBCUTANEOUS
  Administered 2017-05-12: 7 [IU] via SUBCUTANEOUS
  Administered 2017-05-12: 5 [IU] via SUBCUTANEOUS
  Administered 2017-05-12: 3 [IU] via SUBCUTANEOUS
  Administered 2017-05-13: 2 [IU] via SUBCUTANEOUS
  Administered 2017-05-13: 7 [IU] via SUBCUTANEOUS
  Administered 2017-05-13: 2 [IU] via SUBCUTANEOUS
  Administered 2017-05-14: 9 [IU] via SUBCUTANEOUS
  Administered 2017-05-14 – 2017-05-15 (×3): 2 [IU] via SUBCUTANEOUS

## 2017-05-10 MED ORDER — PREDNISONE 20 MG PO TABS
40.0000 mg | ORAL_TABLET | Freq: Every day | ORAL | Status: AC
  Administered 2017-05-10 – 2017-05-14 (×5): 40 mg via ORAL
  Filled 2017-05-10 (×5): qty 2

## 2017-05-10 MED ORDER — FUROSEMIDE 10 MG/ML IJ SOLN
40.0000 mg | Freq: Once | INTRAMUSCULAR | Status: AC
  Administered 2017-05-10: 40 mg via INTRAVENOUS
  Filled 2017-05-10: qty 4

## 2017-05-10 MED ORDER — MOMETASONE FURO-FORMOTEROL FUM 200-5 MCG/ACT IN AERO
2.0000 | INHALATION_SPRAY | Freq: Two times a day (BID) | RESPIRATORY_TRACT | Status: DC
  Administered 2017-05-10 – 2017-05-15 (×10): 2 via RESPIRATORY_TRACT
  Filled 2017-05-10: qty 8.8

## 2017-05-10 MED ORDER — LEVOFLOXACIN 500 MG PO TABS
500.0000 mg | ORAL_TABLET | Freq: Every day | ORAL | Status: DC
  Administered 2017-05-10 – 2017-05-15 (×6): 500 mg via ORAL
  Filled 2017-05-10 (×6): qty 1

## 2017-05-10 MED ORDER — FUROSEMIDE 40 MG PO TABS
40.0000 mg | ORAL_TABLET | Freq: Every day | ORAL | Status: DC
  Administered 2017-05-10 – 2017-05-11 (×2): 40 mg via ORAL
  Filled 2017-05-10 (×2): qty 1

## 2017-05-10 MED ORDER — DILTIAZEM HCL 100 MG IV SOLR
5.0000 mg/h | INTRAVENOUS | Status: DC
  Administered 2017-05-10 – 2017-05-11 (×5): 15 mg/h via INTRAVENOUS
  Administered 2017-05-11: 5 mg/h via INTRAVENOUS
  Administered 2017-05-11: 15 mg/h via INTRAVENOUS
  Filled 2017-05-10 (×6): qty 100

## 2017-05-10 MED ORDER — ASPIRIN 81 MG PO CHEW
81.0000 mg | CHEWABLE_TABLET | Freq: Every day | ORAL | Status: DC
  Administered 2017-05-10 – 2017-05-15 (×6): 81 mg via ORAL
  Filled 2017-05-10 (×6): qty 1

## 2017-05-10 MED ORDER — SODIUM CHLORIDE 0.9% FLUSH
3.0000 mL | Freq: Two times a day (BID) | INTRAVENOUS | Status: DC
  Administered 2017-05-10 – 2017-05-15 (×8): 3 mL via INTRAVENOUS

## 2017-05-10 NOTE — Progress Notes (Signed)
Family Medicine Teaching Service Daily Progress Note Intern Pager: [phone]  Patient name: Jill Ruppe Medical record number: [id number] Date of birth: 1929-07-05 Age: 83 y.o. Gender: male  Primary Care Provider: Katherina Mires, MD  Consultants: Cardiology Code Status: FULL  Pt Overview and Major Events to Date:  Bartosz Luginbill is a 83 year old male who presented on 05/09/17 for a one week hx of SOB, wheezing, and nonproductive cough.  He has a PMH significant for glaucoma, COPD, prostate cancer, stroke, TIA, and T2DM.  For the past week, he has been using duonebs about 3 x per day and albuterol inhaler 2 x per day.  In the ED, he received solumedrol and duoneb and noted improvement.  He also was found to have atrial flutter in the ED, so he was given metoprolol then a diltiazem drip.  Assessment and Plan:  Edahi Kroening is a 83 y.o. male presenting with SOB x 1 week . PMH is significant for glaucoma, COPD, prostate cancer, stroke, TIA, and T2DM.    SOB Patient complains of SOB x 1 week. Likely a combination of COPD exacerbation and fluid overload. Patient had bilateral expiratory wheezing and slight crackles on exam. Patient had 1+ pitting edema, unsure if he took morning lasix today. CXR showed cardiac enlargement with bibasilar atelectasis and atherosclerotic aorta. CT angio showed no evident pulmonary embolus, prominence of the main pulmonary outflow tract indicating possible pulmonary arterial hypertension, and findings representative of a degree of congestive heart failure.  According to patient, he has no known history of CHF.  Had elevated BNP to 300 in Sept 2017. From chart review, echo obtained during admission for CVA in 2012 showed EF 30-35% and diffuse hypokinesis. Patient reports he has not been followed by cardiology.  Patient reports he takes his Lasix 40 mg PO daily for chronic LE edema. Low suspicion for infectious process given lack of fever, lack of leukocytosis, and no  infiltrate on CXR.  BNP 590 on 7/19, up from 312 last year.  EKG on 7/19 showed atrial flutter, variable AV block, T wave ischemia -continuous pulse ox -re-assess volume status in AM, more lasix if needed -ipratropium q6h  -levalbuterol q6h/1h prn (avoiding albuterol due to patient's tachycardia)  -levofloxacin 750 mg  -dulera bid, initiating controller medication as patient had hospitalization for COPD exacerbation within the past year  -prednisone 40 mg daily -daily weights -strict I's and O's   New onset Atrial flutter  Patient presented to ED with new onset atrial flutter. HR of 131. Was put on Cardizem drip in ED with resulting correction of heart rate. Tachycardia likely worsened in the setting of albuterol treatment. CHADSVASC score of 6. No history of bleeding.  TSH wnl at 0.437.  First troponin 0.03. -continue Cardizem drip -continue to monitor  -echocardiogram -am EKG -heparin gtt per pharmacy  -cardiology consult in AM  -troponins to evaluate for potential causes of heart arrhythmia    AKI: Cr 1.58 (bl 0.9-1.0). May be pre-renal as concern that patient is currently volume overloaded. Cr 1.69 on 05/10/17 after diuresis -keep diuresis off for now; will reassess volume status and continue to monitor creatinine -consider urine studies if no improvement  -holding Ramipril    COPD Patient has history of COPD. Was followed by Dr. Annamaria Boots, pulmonologist, several years ago but has not had follow up recent due to no recent SOB. CXR showed cardiac enlargement with bibasilar atelectasis and atherosclerotic aorta. -breathing treatments as above     T2DM Patient on  home levemir 47 units prn if glucose over 190. Last hemoglobin A1c 10 (Sept 2017). CBG at 0805 on 7/19 is 368 -levemir 20 units daily  -sliding scale  -CBGs AC/HS  -Hemoglobin A1c  -diabetic coordinator consult -check 24 hour insulin requirement 7/20 am and adjust as necessary   History of CVA: Reportedly  without residual deficits.  -continue ASA 81 mg  -care everywhere PCP note states he is on Plavix and a statin, confirm with pharmacy in AM and restart as needed   FEN/GI: carb modified diet  Prophylaxis: heparin    Disposition: Home  Subjective:  Mr. Cossey says he is feeling well this morning and feels a little short of breath, although this is better than last night.  He denies heart palpitations or chest pain.  Talked with daughter who says that he lives with his wife and that he is sometimes altered but is usually fully alert and oriented.  Objective: Temp:  [98.3 F (36.8 C)-98.4 F (36.9 C)] 98.4 F (36.9 C) (07/19 0327) Pulse Rate:  [25-139] 66 (07/19 0327) Resp:  [12-28] 24 (07/19 0327) BP: (99-139)/(57-96) 127/70 (07/19 0327) SpO2:  [97 %-100 %] 100 % (07/19 0327) Weight:  [200 lb (90.7 kg)-206 lb 1.6 oz (93.5 kg)] 206 lb 1.6 oz (93.5 kg) (07/19 0052) Physical Exam: General: pleasant, comfortable appearing elderly man lying in bed Cardiovascular: irregular rhythm, normal rate, no MRG Respiratory: prolonged expiratory wheeze, no crackles Abdomen: soft, nontender, +bowel sounds Extremities: 1+ edema in lower extremities, left worse than right Neuro: oriented only to self  Laboratory:  Recent Labs Lab 05/09/17 1212 05/10/17 0221  WBC 7.5 7.7  HGB 11.4* 11.2*  HCT 36.4* 35.2*  PLT 136* 135*    Recent Labs Lab 05/09/17 1212 05/10/17 0221  NA 136 134*  K 5.0 5.1  CL 107 106  CO2 22 18*  BUN 22* 34*  CREATININE 1.58* 1.69*  CALCIUM 8.8* 8.5*  PROT 6.4*  --   BILITOT 0.9  --   ALKPHOS 77  --   ALT 10*  --   AST 14*  --   GLUCOSE 268* 515*      Imaging/Diagnostic Tests: Ct Angio Chest Pe W And/or Wo Contrast  Result Date: 05/09/2017 CLINICAL DATA:  Shortness of Breath EXAM: CT ANGIOGRAPHY CHEST WITH CONTRAST TECHNIQUE: Multidetector CT imaging of the chest was performed using the standard protocol during bolus administration of intravenous  contrast. Multiplanar CT image reconstructions and MIPs were obtained to evaluate the vascular anatomy. CONTRAST:  80 mL Isovue 370 nonionic COMPARISON:  Chest radiograph May 09, 2017 FINDINGS: Cardiovascular: There is no demonstrable pulmonary embolus. The main pulmonary outflow tract measures 3.1 cm in diameter, a finding felt to be indicative of a degree of pulmonary arterial hypertension. There is no appreciable thoracic aortic aneurysm. While no dissection of the aorta is seen, the contrast bolus was not timed to allow for confident assessment of the thoracic aorta for potential dissection. There are foci of calcification in the proximal visualized great vessels. There are multiple foci of atherosclerotic calcification in the aorta. There are foci of calcification in several coronary arteries. The pericardium is not appreciably thickened. Mediastinum/Nodes: Thyroid appears unremarkable. There is no appreciable thoracic adenopathy. Scattered subcentimeter mediastinal lymph nodes are noted. There is a small hiatal hernia. Lungs/Pleura: There is scarring in the right apex. There are pleural effusions tracking along each major fissure. There is patchy bibasilar atelectatic change. There is mild interstitial edema in the lower lung zones. There  is no frank airspace consolidation. Upper Abdomen: There is atherosclerotic calcification in the aorta. There is cholelithiasis. There is slight reflux of contrast into the inferior vena cava with trace contrast refluxed into the hepatic veins. Musculoskeletal: There is midthoracic dextroscoliosis. There is degenerative change in the thoracic spine. There are no blastic or lytic bone lesions. Review of the MIP images confirms the above findings. IMPRESSION: 1. No evident pulmonary embolus. Prominence of the main pulmonary outflow tract may indicate a degree of pulmonary arterial hypertension. 2. No thoracic aortic aneurysm. No dissection evident. Note that the contrast bolus  is not timed to optimize assessment for potential thoracic aortic dissection. There are foci of atherosclerotic calcification in the aorta and great vessels. There are foci of coronary artery calcification. 3. Findings felt to represent a degree of congestive heart failure. No airspace consolidation. 4.  No appreciable adenopathy. 5. Slight reflux of contrast into the inferior vena cava and hepatic veins may indicate a degree of increase in right heart pressure. 6.  Cholelithiasis. Aortic Atherosclerosis (ICD10-I70.0). Electronically Signed   By: Lowella Grip III M.D.   On: 05/09/2017 16:20   Dg Chest Portable 1 View  Result Date: 05/09/2017 CLINICAL DATA:  short of breath EXAM: PORTABLE CHEST 1 VIEW COMPARISON:  07/01/2016 FINDINGS: Cardiac enlargement. Negative for heart failure. Mild bibasilar atelectasis. Negative for pleural effusion IMPRESSION: Cardiac enlargement with bibasilar atelectasis Atherosclerotic aorta Electronically Signed   By: Franchot Gallo M.D.   On: 05/09/2017 12:27     Kathrene Alu, MD 05/10/2017, 6:48 AM PGY-1, [place] Intern pager: [phone], text pages welcome

## 2017-05-10 NOTE — Consult Note (Signed)
Cardiology Consultation:   Patient ID: Dustin Leonard; [id number]; Apr 11, 1929   Admit date: 05/09/2017 Date of Consult: 05/10/2017  Primary Care Provider: Katherina Mires, MD Primary Cardiologist: Remotely seen by Dr. Terrence Dupont 2012 - I contacted his office and he recommended [facility] see the patient in case EP involvement is needed.  Chief Complaint: shortness of breath, cough  Patient Profile:   Dustin Leonard is a 83 y.o. male with a hx of COPD, stroke 2012 (pt reports 2 prior strokes total), cardiomyopathy dx at time of stroke (has not had further w/u), known LBBB, asthma, 20-30 yrs prior tobacco use, prostate CA, DM,  who is being seen today for the evaluation of atrial flutter at the request of Dr. Ardelia Mems.  History of Present Illness:   The patient does not know much about his cardiac history and his family did not recall this information either, but in 2012, he was found to have a cardiomyopathy at time of a stroke. He was seen by Dr. Terrence Dupont during that admission - 2D echo 2012: EF 30-35%, diffuse HK, grade 1 DD, trivial pericardial effusion. Plans were for OP follow-up but it doesn't appear he ever followed up. He has no prior ischemic eval that I can find, and does not ever recall having a stress test or cath. At some point he followed with Dr. Doylene Canard as well but denies having any recent f/u with cardiology. He's been followed by primary care. He remotely saw Dr. Annamaria Boots with pulm, was using O2 QHS.  For the past week he has noticed a nonproductive cough as well as increased SOB with any amount of movement. He denies any orthopnea or PND to me. He has noticed increased wheezing and has been using his nebs and inhaler more. He also has h/o chronic LEE for which his daughter states he takes a "fluid pill." In the ED he was found to be in new onset atrial flutter with RVR, BNP 590, troponins 0.01-0.03-0.03-0.04, Hgb 11.4, plt 136, glucose 268-515, BUN/Cr elevated 1.58->1.69 (previously 0.9-1.3).  He was treated with a dose of IV Lopressor and started on a diltiazem drip, as well as given 511ml saline bolus, heparin per pharmacy, steroids and nebs. CT angio showed no acute PE or dissection, a degree of CHF with possible increase in R heart pressure. CXR neg for CHF, did show cardiac enlargement with bibasilar atelectasis. He has been continued on home dose Lasix 40mg . He currently feels fine. HR remains 95-105 on 15mg /hr of diltiazem. His daughter states he looks exactly how he always does right now and that she knows him better than anyone. She seems somewhat skeptical of the interventions being performed in the hospital and wants to know if we can cure what he has so he can go home.   Past Medical History:  Diagnosis Date  . Asthma   . Cardiomyopathy ([place])    a. 2D echo 2012: EF 30-35%, diffuse HK, grade 1 DD, trivial pericardial effusion.  Marland Kitchen COPD (chronic obstructive pulmonary disease) ([place])   . Diabetes mellitus type 2 in nonobese ([facility])   . Former tobacco use   . Prostate cancer ([place])   . Stroke [facility])     Past Surgical History:  Procedure Laterality Date  . PROSTATE SURGERY       Inpatient Medications: Scheduled Meds: . aspirin  81 mg Oral Daily  . furosemide  40 mg Oral Daily  . insulin aspart  0-9 Units Subcutaneous TID WC  . insulin detemir  20 Units  Subcutaneous Daily  . ipratropium  0.5 mg Nebulization QID  . levalbuterol  0.63 mg Nebulization QID  . levofloxacin  500 mg Oral Daily  . mometasone-formoterol  2 puff Inhalation BID  . predniSONE  40 mg Oral Q breakfast  . sodium chloride flush  3 mL Intravenous Q12H   Continuous Infusions: . diltiazem (CARDIZEM) infusion 15 mg/hr (05/10/17 1122)  . heparin 1,000 Units/hr (05/10/17 1124)   PRN Meds: levalbuterol  Allergies:   No Known Allergies  Social History:   Social History   Social History  . Marital status: Married    Spouse name: N/A  . Number of children: N/A  . Years of education: N/A    Occupational History  . retired    Social History Main Topics  . Smoking status: Former Smoker    Types: Cigarettes    Quit date: 10/23/1957  . Smokeless tobacco: Never Used     Comment: Smoked 1/2 ppd x 23 years  . Alcohol use No  . Drug use: No  . Sexual activity: Not Currently    Partners: Female   Other Topics Concern  . Not on file   Social History Narrative   Lives with wife, does ambulate at home without adjuncts, however when going out uses walker, cane.     Family History:   The patient's family history includes Diabetes in his maternal aunt.  ROS:  Please see the history of present illness.  All other ROS reviewed and negative.     Physical Exam/Data:   Vitals:   05/10/17 0858 05/10/17 1254 05/10/17 1300 05/10/17 1316  BP:    (!) 119/53  Pulse:    (!) 44  Resp:    (!) 21  Temp:   97.7 F (36.5 C)   TempSrc:   Oral   SpO2: 99% 99%  100%  Weight:      Height:        Intake/Output Summary (Last 24 hours) at 05/10/17 1442 Last data filed at 05/10/17 1424  Gross per 24 hour  Intake          1502.55 ml  Output              850 ml  Net           652.55 ml   Filed Weights   05/09/17 1156 05/10/17 0052  Weight: 200 lb (90.7 kg) 206 lb 1.6 oz (93.5 kg)   Body mass index is 32.28 kg/m.  General: Elderly AAM in no acute distress. Head: Normocephalic, atraumatic, sclera non-icteric, no xanthomas, nares are without discharge.  R cataract Neck: Negative for carotid bruits. JVD not elevated. Lungs: Diffuse wheezing. No rales or rhonchi. Moderate air movement. Breathing is unlabored. Heart: Irregularly irregular, rate mildly elevated, with S1 S2. No murmurs, rubs, or gallops appreciated. Abdomen: Soft, non-tender, non-distended with normoactive bowel sounds. No hepatomegaly. No rebound/guarding. No obvious abdominal masses. Msk:  Strength and tone appear normal for age. Extremities: No clubbing or cyanosis. No edema.  Distal pedal pulses are 2+ and equal  bilaterally. Neuro: Alert and oriented X 3. No facial asymmetry. No focal deficit. Moves all extremities spontaneously. Psych:  Responds to questions appropriately with a normal affect.  EKG:  The EKG was personally reviewed and demonstrates atrial flutter with LBBB - initially 2:1 conduction then f/u tracing variable AV block . Telemetry personally reviewed also shows persistent atrial flutter  Relevant CV Studies: Summarized above  Laboratory Data:  Chemistry Recent Labs Lab 05/09/17 1212  05/10/17 0221  NA 136 134*  K 5.0 5.1  CL 107 106  CO2 22 18*  GLUCOSE 268* 515*  BUN 22* 34*  CREATININE 1.58* 1.69*  CALCIUM 8.8* 8.5*  GFRNONAA 38* 35*  GFRAA 44* 41*  ANIONGAP 7 10     Recent Labs Lab 05/09/17 1212  PROT 6.4*  ALBUMIN 3.3*  AST 14*  ALT 10*  ALKPHOS 77  BILITOT 0.9   Hematology Recent Labs Lab 05/09/17 1212 05/10/17 0221  WBC 7.5 7.7  RBC 4.03* 3.90*  HGB 11.4* 11.2*  HCT 36.4* 35.2*  MCV 90.3 90.3  MCH 28.3 28.7  MCHC 31.3 31.8  RDW 14.0 13.9  PLT 136* 135*   Cardiac Enzymes Recent Labs Lab 05/10/17 0221 05/10/17 0718 05/10/17 1343  TROPONINI 0.03* 0.03* 0.04*    Recent Labs Lab 05/09/17 1239  TROPIPOC 0.01    BNP Recent Labs Lab 05/10/17 0718  BNP 590.2*    DDimer  Recent Labs Lab 05/09/17 1212  DDIMER 0.70*    Radiology/Studies:  Ct Angio Chest Pe W And/or Wo Contrast  Result Date: 05/09/2017 CLINICAL DATA:  Shortness of Breath EXAM: CT ANGIOGRAPHY CHEST WITH CONTRAST TECHNIQUE: Multidetector CT imaging of the chest was performed using the standard protocol during bolus administration of intravenous contrast. Multiplanar CT image reconstructions and MIPs were obtained to evaluate the vascular anatomy. CONTRAST:  80 mL Isovue 370 nonionic COMPARISON:  Chest radiograph May 09, 2017 FINDINGS: Cardiovascular: There is no demonstrable pulmonary embolus. The main pulmonary outflow tract measures 3.1 cm in diameter, a finding felt  to be indicative of a degree of pulmonary arterial hypertension. There is no appreciable thoracic aortic aneurysm. While no dissection of the aorta is seen, the contrast bolus was not timed to allow for confident assessment of the thoracic aorta for potential dissection. There are foci of calcification in the proximal visualized great vessels. There are multiple foci of atherosclerotic calcification in the aorta. There are foci of calcification in several coronary arteries. The pericardium is not appreciably thickened. Mediastinum/Nodes: Thyroid appears unremarkable. There is no appreciable thoracic adenopathy. Scattered subcentimeter mediastinal lymph nodes are noted. There is a small hiatal hernia. Lungs/Pleura: There is scarring in the right apex. There are pleural effusions tracking along each major fissure. There is patchy bibasilar atelectatic change. There is mild interstitial edema in the lower lung zones. There is no frank airspace consolidation. Upper Abdomen: There is atherosclerotic calcification in the aorta. There is cholelithiasis. There is slight reflux of contrast into the inferior vena cava with trace contrast refluxed into the hepatic veins. Musculoskeletal: There is midthoracic dextroscoliosis. There is degenerative change in the thoracic spine. There are no blastic or lytic bone lesions. Review of the MIP images confirms the above findings. IMPRESSION: 1. No evident pulmonary embolus. Prominence of the main pulmonary outflow tract may indicate a degree of pulmonary arterial hypertension. 2. No thoracic aortic aneurysm. No dissection evident. Note that the contrast bolus is not timed to optimize assessment for potential thoracic aortic dissection. There are foci of atherosclerotic calcification in the aorta and great vessels. There are foci of coronary artery calcification. 3. Findings felt to represent a degree of congestive heart failure. No airspace consolidation. 4.  No appreciable  adenopathy. 5. Slight reflux of contrast into the inferior vena cava and hepatic veins may indicate a degree of increase in right heart pressure. 6.  Cholelithiasis. Aortic Atherosclerosis (ICD10-I70.0). Electronically Signed   By: Lowella Grip III M.D.   On: 05/09/2017 16:20  Dg Chest Portable 1 View  Result Date: 05/09/2017 CLINICAL DATA:  short of breath EXAM: PORTABLE CHEST 1 VIEW COMPARISON:  07/01/2016 FINDINGS: Cardiac enlargement. Negative for heart failure. Mild bibasilar atelectasis. Negative for pleural effusion IMPRESSION: Cardiac enlargement with bibasilar atelectasis Atherosclerotic aorta Electronically Signed   By: Franchot Gallo M.D.   On: 05/09/2017 12:27    Assessment and Plan:   1. Shortness of breath/wheezing/cough, suspect multifactorial due to AECOPD, also compounded by newly recognized atrial flutter RVR and prior known cardiomyopathy - duration of atrial flutter unknown but has been feeling unwell for about a week. Not previously on anticoagulation - at some point in 2012 was on Plavix. Mild thrombocytopenia noted but patient denies prior h/o bleeding. He's currently on heparin. Given his AKI, would need to consider renally adjusted medication if changing to oral. Diltiazem is being used for rate control currently. Longterm this is not a good solution given his cardiomyopathy but he does have diffuse wheezing on exam, so would exercise caution with BB. Will discuss further rx with Dr. Debara Pickett.  2. Acute on chronic systolic CHF - BNP up but clinically does not appear markedly volume overloaded. Family reports he is at baseline. Continue oral Lasix. See above re: BB. Given AKI would ACEI/ARB/ARNI/spironolactone for now, could be considered in the future.  3. 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   4. Marginally elevated troponin - suspected demand ischemia, but no prior  ischemic eval. Will review with MD.  Signed, Charlie Pitter, PA-C  05/10/2017 2:42 PM

## 2017-05-10 NOTE — Progress Notes (Signed)
ANTICOAGULATION CONSULT NOTE - Follow Up Consult  Pharmacy Consult for heparin Indication: atrial fibrillation  Labs:  Recent Labs  05/09/17 1212 05/10/17 0221 05/10/17 0430  HGB 11.4* 11.2*  --   HCT 36.4* 35.2*  --   PLT 136* 135*  --   HEPARINUNFRC  --   --  1.18*  CREATININE 1.58* 1.69*  --   TROPONINI  --  0.03*  --     Assessment: 83yo male above goal on heparin with initial dosing for new Afib/flutter, may still have some effect from bolus given age/CrCl.  Goal of Therapy:  Heparin level 0.3-0.7 units/ml   Plan:  Will decrease heparin gtt by 2 units/kg/hr to 1000 units/hr and check level in [place], PharmD, BCPS  05/10/2017,6:44 AM

## 2017-05-10 NOTE — Evaluation (Signed)
Physical Therapy Evaluation Patient Details Name: Dustin Leonard MRN: [id number] DOB: [birth year] Today's Date: 05/10/2017   History of Present Illness  Dustin Leonard is a 83 year old male who presented on 05/09/17 for a one week hx of SOB, wheezing, and nonproductive cough.  He has a PMH significant for glaucoma, COPD, prostate cancer, stroke, TIA, and T2DM.  For the past week, he has been using duonebs about 3 x per day and albuterol inhaler 2 x per day.  In the ED, he received solumedrol and duoneb and noted improvement.  He also was found to have atrial flutter in the ED, so he was given metoprolol then a diltiazem drip.  Clinical Impression  Pt admitted with above diagnosis. Pt currently with functional limitations due to the deficits listed below (see PT Problem List). Pt was able to ambulate in hallway with min assist with RW with cues for safety.  HR up to 135 bpm.  Will continue acute PT.   Pt will benefit from skilled PT to increase their independence and safety with mobility to allow discharge to the venue listed below.      Follow Up Recommendations Home health PT;Supervision/Assistance - 24 hour    Equipment Recommendations  None recommended by PT    Recommendations for Other Services       Precautions / Restrictions Precautions Precautions: Fall Restrictions Weight Bearing Restrictions: No      Mobility  Bed Mobility Overal bed mobility: Needs Assistance Bed Mobility: Supine to Sit     Supine to sit: Min assist     General bed mobility comments: Took incr time for pt to come to EOB but he did not need a lot of help.  Used momentum and was impulsive slightly.   Transfers Overall transfer level: Needs assistance Equipment used: Rolling walker (2 wheeled) Transfers: Sit to/from Stand Sit to Stand: Min assist;From elevated surface;Mod assist         General transfer comment: Needed assist to power up and get balance.   Ambulation/Gait Ambulation/Gait  assistance: Min assist Ambulation Distance (Feet): 125 Feet Assistive device: Rolling walker (2 wheeled) Gait Pattern/deviations: Decreased stride length;Step-through pattern;Trunk flexed;Wide base of support;Drifts right/left   Gait velocity interpretation: Below normal speed for age/gender General Gait Details: Needed cues to stay close to RW and to sequence steps and RW.  Pt unsteady at times due to not staying close to RW.  Pt HR up to 135 bpm as well.  Did not push pt due to incr HR with activity.   Stairs            Wheelchair Mobility    Modified Rankin (Stroke Patients Only)       Balance Overall balance assessment: Needs assistance Sitting-balance support: No upper extremity supported;Feet supported Sitting balance-Leahy Scale: Good     Standing balance support: Bilateral upper extremity supported;During functional activity Standing balance-Leahy Scale: Poor Standing balance comment: relies on UE support for balance                             Pertinent Vitals/Pain Pain Assessment: No/denies pain    Home Living Family/patient expects to be discharged to:: Private residence Living Arrangements: Spouse/significant other Available Help at Discharge: Family;Available 24 hours/day (wife and daughter) Type of Home: House Home Access: Stairs to enter Entrance Stairs-Rails: Right;Left;Can reach both Entrance Stairs-Number of Steps: 4 Home Layout: Two level;Able to live on main level with bedroom/bathroom;Laundry or work area in  basement Home Equipment: Walker - 2 wheels;Grab bars - tub/shower Additional Comments: 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    Prior Function Level of Independence: Independent with assistive device(s)         Comments: Uses RW as needed per daughter so there are times he uses nothing     Hand Dominance   Dominant Hand: Right    Extremity/Trunk Assessment   Upper Extremity Assessment Upper  Extremity Assessment: Defer to OT evaluation    Lower Extremity Assessment Lower Extremity Assessment: Generalized weakness    Cervical / Trunk Assessment Cervical / Trunk Assessment: Normal  Communication   Communication: No difficulties  Cognition Arousal/Alertness: Awake/alert Behavior During Therapy: Flat affect Overall Cognitive Status: Within Functional Limits for tasks assessed                                        General Comments      Exercises     Assessment/Plan    PT Assessment Patient needs continued PT services  PT Problem List Decreased activity tolerance;Decreased balance;Decreased mobility;Decreased knowledge of use of DME;Decreased safety awareness;Decreased knowledge of precautions;Cardiopulmonary status limiting activity       PT Treatment Interventions DME instruction;Gait training;Functional mobility training;Therapeutic activities;Therapeutic exercise;Balance training;Patient/family education    PT Goals (Current goals can be found in the Care Plan section)  Acute Rehab PT Goals Patient Stated Goal: to go home PT Goal Formulation: With patient Time For Goal Achievement: 05/24/17 Potential to Achieve Goals: Good    Frequency Min 3X/week   Barriers to discharge        Co-evaluation               AM-PAC PT "6 Clicks" Daily Activity  Outcome Measure Difficulty turning over in bed (including adjusting bedclothes, sheets and blankets)?: Total Difficulty moving from lying on back to sitting on the side of the bed? : Total Difficulty sitting down on and standing up from a chair with arms (e.g., wheelchair, bedside commode, etc,.)?: A Lot Help needed moving to and from a bed to chair (including a wheelchair)?: A Lot Help needed walking in hospital room?: A Little Help needed climbing 3-5 steps with a railing? : Total 6 Click Score: 10    End of Session Equipment Utilized During Treatment: Gait belt Activity Tolerance:  Patient limited by fatigue Patient left: in bed;with call bell/phone within reach;with bed alarm set;with family/visitor present Nurse Communication: Mobility status PT Visit Diagnosis: Unsteadiness on feet (R26.81);Muscle weakness (generalized) (M62.81)    Time: 4132-4401 PT Time Calculation (min) (ACUTE ONLY): 17 min   Charges:   PT Evaluation $PT Eval Moderate Complexity: 1 Procedure     PT G Codes:        Daleyza Gadomski,PT Acute Rehabilitation [phone] (pager)   Denice Paradise 05/10/2017, 11:23 AM

## 2017-05-10 NOTE — Progress Notes (Signed)
CRITICAL VALUE ALERT  Critical Value: glucose 515, troponin 0.03  Date & Time Notied:  05/10/2017 0300  Provider Notified: Raliegh Ip Schorr  Orders Received/Actions taken:

## 2017-05-10 NOTE — Care Management Note (Signed)
Case Management Note  Patient Details  Name: Dustin Leonard MRN: [id number] Date of Birth: 13-Jun-1929  Subjective/Objective:    Pt admitted SOB                 Action/Plan:   PTA independent from home with wife - daughter lives nearby.  CM requested [place] as recommended via physician sticky note   Expected Discharge Date:  05/13/17               Expected Discharge Plan:  [place]  In-House Referral:     Discharge planning Services  CM Consult  Post Acute Care Choice:    Choice offered to:     DME Arranged:    DME Agency:     HH Arranged:    HH Agency:     Status of Service:     If discussed at [employer], dates discussed:    Additional Comments:  Maryclare Labrador, RN 05/10/2017, 4:08 PM

## 2017-05-10 NOTE — Progress Notes (Signed)
Family Medicine Progress Note  Saw and examined patient with Dr. Yisroel Ramming. Patient resting comfortably with no complaints. Multiple family members noted at bedside. Patient denies any chest pain or shortness of breath. Continue to be tachy up to 110s, dilt gtt currently at 15 mL/hr. Given that patient is asymptomatic will defer from starting additional treatment. Reviewed echo results and note EF of 30-35%.  Guadalupe Dawn MD PGY-1 Family Medicine Residency

## 2017-05-10 NOTE — Progress Notes (Signed)
Nutrition Brief Note  Received consult per COPD gold protocol. No weight changes noted recently. Nutrition focused physical exam completed.  No muscle or subcutaneous fat depletion noticed.  Wt Readings from Last 6 Encounters:  05/10/17 206 lb 1.6 oz (93.5 kg)  07/12/16 207 lb (93.9 kg)  07/07/16 207 lb (93.9 kg)  07/05/16 207 lb (93.9 kg)  07/01/16 189 lb 4.8 oz (85.9 kg)  09/22/14 183 lb (83 kg)    Body mass index is 32.28 kg/m. Patient meets criteria for obesity based on current BMI.   Current diet order is CHO modified, patient is consuming approximately 50-100% of meals at this time. Labs and medications reviewed.   No nutrition interventions warranted at this time. If nutrition issues arise, please consult RD.   Molli Barrows, RD, LDN, [place] Pager [phone] After Hours Pager [phone]

## 2017-05-10 NOTE — Discharge Summary (Signed)
[facility] Discharge Summary  Patient name: Dustin Leonard Medical record number: [id number] Date of birth: Sep 26, 1929 Age: 83 y.o. Gender: male Date of Admission: 05/09/2017  Date of Discharge: 05/15/17 Admitting Physician: Leeanne Rio, MD  Primary Care Provider: Katherina Mires, MD Consultants: Cardiology  Indication for Hospitalization: SOB and cough x 1 week  Discharge Diagnoses/Problem List:  1.  COPD 2.  HFrEF 3.  Type 2 DM, uncontrolled 4.  Atrial Flutter 5.  Hx of CVA 6.  Glaucoma  Disposition: home  Discharge Condition: stable, improving  Discharge Exam: please see progress note from day of discharge  Brief Hospital Course:  Dustin Leonard was admitted on 05/09/17 for a 1 week hx of SOB and dry cough.  He was given solumedrol and duoneb treatments in the ED, and improved.  An EKG found atrial flutter, and his heart rate was 130, so he was started on a Cardizem drip.  After admission, he was given Lasix 40 mg IV, ipratropium q6h, levalbuterol q6h, prednisone 40 mg daily, levofloxacin 750 mg, and dulera BID.  On 05/10/17, he continued to have expiratory wheezes but was less short of breath.  Repeat EKG showed continued atrial flutter abd left bundle branch block.  Cardiology was consulted, and an echo was performed, which showed LV dilation and EF of 30-35%.  He was successfully cardioverted on 7/20 after negative TEE and started on amiodarone IV.  This was transitioned to 400 mg PO amiodarone on 7/22.  Patient instructed to take 200 mg amiodarone daily after two more days of 400 mg daily and to follow-up with cardiology within the month.  Type 2 DM: Patient reports taking 47 U Levemir PRN for blood sugars > 190, which is not the ideal way to take this medication; however, patient's family said that he prefers to avoid needle sticks, so that is why his regimen is this way.  Due to his advanced age and comorbidities, tight glucose control is not  necessary, but his A1C is 11.5, so his regimen could be improved.  We achieved the best control of his blood sugars by giving him 20 U Levemir BID plus sensitive slide scale.  He was discharged on 05/15/17 after reviewing with his family the importance of scheduling a follow-up appointment with his PCP in the next week or two and after the family agreed to change his home insulin regimen to Levemir 20 U BID rather than Levemir 47 U PRN.  Issues for Follow Up:  1. Is not currently prescribed Plavix or a statin, which is normally prescribed to patients with history of a CVA.  There may have been issues with these medications for him in the past, but this warrants follow-up 2. Has seen pulmonologist Dr. Annamaria Boots for COPD in the past, would be advisable to make appointment with him. 3. Will need to continue controller medication such as Dulera, was prescribed at discharge  4.   Hgb A1C was 11.5 during this hospitalization.  Tight glucose control is not necessary for this patient, but his home insulin regimen was changed from Levemir PRN for glucose > 190 to Levemir 20 U BID to hopefully provide better control at home.  Please continue to adjust insulin regimen as needed. 5.   Patient needs a follow-up appointment with cardiology to address recent diagnosis of atrial flutter and dosing of amiodarone. Significant Procedures: TEE/DCCV  Significant Labs and Imaging:   Recent Labs Lab 05/13/17 0346 05/14/17 0246 05/15/17 0514  WBC 8.5 7.6  9.2  HGB 11.2* 11.7* 12.2*  HCT 34.3* 36.7* 37.7*  PLT 130* 141* 140*    Recent Labs Lab 05/09/17 1212  05/13/17 0346 05/14/17 0246 05/14/17 1446 05/15/17 0007 05/15/17 0514  NA 136  < > 137 137 133* 135 135  K 5.0  < > 4.9 5.7* 6.3* 4.7 4.5  CL 107  < > 108 106 103 106 107  CO2 22  < > 21* 24 24 22  21*  GLUCOSE 268*  < > 232* 283* 320* 258* 137*  BUN 22*  < > 41* 36* 36* 38* 36*  CREATININE 1.58*  < > 1.53* 1.46* 1.57* 1.46* 1.29*  CALCIUM 8.8*  < > 8.7*  8.8* 8.9 8.5* 8.5*  ALKPHOS 77  --   --   --   --   --   --   AST 14*  --   --   --   --   --   --   ALT 10*  --   --   --   --   --   --   ALBUMIN 3.3*  --   --   --   --   --   --   < > = values in this interval not displayed. Echo 7/19  Study Conclusions  - Left ventricle: The cavity size was normal. Wall thickness was   increased in a pattern of mild LVH. Systolic function was   moderately to severely reduced. The estimated ejection fraction   was in the range of 30% to 35%. Dyskinesis of the anteroseptal   myocardium and akinesis of the inferior and inferoseptum. Doppler   parameters are consistent with high ventricular filling pressure. - Aortic valve: Transvalvular velocity was within the normal range.   There was no stenosis. There was trivial regurgitation. - Mitral valve: Mildly calcified annulus. Transvalvular velocity   was within the normal range. There was no evidence for stenosis.   There was mild regurgitation. - Left atrium: The atrium was moderately dilated. - Right ventricle: The cavity size was normal. Wall thickness was   normal. Systolic function was normal. - Pulmonary arteries: Systolic pressure was mildly increased. PA   peak pressure: 46 mm Hg (S).  Results/Tests Pending at Time of Discharge: none  Discharge Medications:  Allergies as of 05/15/2017   No Known Allergies     Medication List    STOP taking these medications   albuterol 108 (90 Base) MCG/ACT inhaler Commonly known as:  PROVENTIL HFA;VENTOLIN HFA     TAKE these medications   amiodarone 200 MG tablet Commonly known as:  PACERONE Take 1 tablet (200 mg total) by mouth daily.   apixaban 5 MG Tabs tablet Commonly known as:  ELIQUIS Take 1 tablet (5 mg total) by mouth 2 (two) times daily.   aspirin 81 MG chewable tablet Chew 81 mg by mouth daily.   Cholecalciferol 1000 units tablet Take 1,000 Units by mouth daily.   DRY EYES OP Place 1 drop into the right eye daily as needed  (dry eye).   furosemide 40 MG tablet Commonly known as:  LASIX Take 40 mg by mouth daily.   insulin detemir 100 UNIT/ML injection Commonly known as:  LEVEMIR Inject 0.2 mLs (20 Units total) into the skin 2 (two) times daily. What changed:  how much to take  when to take this  additional instructions   ipratropium-albuterol 0.5-2.5 (3) MG/3ML Soln Commonly known as:  DUONEB Take 3 mLs by nebulization every  6 (six) hours as needed. What changed:  reasons to take this   levalbuterol 1.25 MG/0.5ML nebulizer solution Commonly known as:  XOPENEX Take 1.25 mg by nebulization every 6 (six) hours as needed for shortness of breath.   levofloxacin 500 MG tablet Commonly known as:  LEVAQUIN Take 1 tablet (500 mg total) by mouth daily.   metoprolol succinate 25 MG 24 hr tablet Commonly known as:  TOPROL-XL Take 1 tablet (25 mg total) by mouth daily.   mometasone-formoterol 200-5 MCG/ACT Aero Commonly known as:  DULERA Inhale 2 puffs into the lungs 2 (two) times daily.   ramipril 2.5 MG capsule Commonly known as:  ALTACE Take 1 capsule (2.5 mg total) by mouth daily. What changed:  medication strength  how much to take       Discharge Instructions:  Please take 400 mg amiodarone (2 tablets) daily for two days, then 200 mg (1 tablet) daily.  Please see your cardiologist within a month for further instruction about your amiodarone. Please refer to Patient Instructions section of EMR for full details.  Patient was counseled important signs and symptoms that should prompt return to medical care, changes in medications, dietary instructions, activity restrictions, and follow up appointments.   Follow-Up Appointments: Family will make follow-up appointment with PCP.  Needs follow-up appointments in the next few months with pulmonology and cardiology.  Kathrene Alu, MD 05/15/2017, 7:50 PM PGY-1, [place]

## 2017-05-10 NOTE — Progress Notes (Signed)
Attending Brief Admission Note  I am not presently able to cosign the resident H&P as a night shift nurse has an "incomplete edit" in it and the note has since been locked for editing. I will cosign the resident H&P when I am able to access it, but for now here are my thoughts.  Patient seen and examined at 7:45am this morning. Briefly, 83 y.o. male with history of COPD, glaucoma, prostate cancer, prior stroke/TIA presenting with dyspnea, cough, and orthopnea over the last week.   In ED given solumedrol & duoneb as he was noted to be wheezing. CTA obtained which was negative for PE. Also noted to be tachycardic and in a flutter with 2:1 block, so was started on diltiazem drip. Heart rate has since improved overnight.  Exam: Gen: no acute distress, pleasant, cooperative, lying in bed, mildly tachypneic HEENT: normocephalic, atraumatic, moist mucous membranes Heart: irregularly irregular, normal rhythm, no murmurs Lungs: overall clear for me. Mildly increased work of breathing but speaks in full sentences. No wheezing on my exam Abdomen: soft, nontender to palpation Extremities: mild 1+ edema bilateral lower exts  A/P: 1. Shortness of breath - agree likely multifactorial due to COPD exacerbation, possible acute CHF, and aflutter with RVR.  2. COPD - no obvious airspace infiltrate on CT. Agree with levaquin, prednisone, nebs  3. A flutter - on dilt drip, heart rate now controlled. Will consult cardiology. Check echo. If EF proven to be low will need to determine alternative agent for rate control (dilt less ideal in reduced EF).  4. Possible CHF -  No known cardiac history per patient/family, but there is an old echo from May 2012 which appears to have been done as part of a stroke workup, which had low EF. Unclear if this is a chronic finding as he has had no further cardiology follow up.  Check echo here. S/p IV lasix 40mg . Monitor diuresis, continue as needed. Monitor creatinine &  electrolytes, daily weights during diuresis. Also consulting cardiology.  Will cosign resident H&P when I am able to access it.  Chrisandra Netters, MD [place]

## 2017-05-10 NOTE — Progress Notes (Signed)
ANTICOAGULATION CONSULT NOTE  Pharmacy Consult for heparin Indication: atrial fibrillation  No Known Allergies  Patient Measurements: Height: 5\' 7"  (170.2 cm) Weight: 206 lb 1.6 oz (93.5 kg) IBW/kg (Calculated) : 66.1 Heparin Dosing Weight: 83kg  Vital Signs: Temp: 97.7 F (36.5 C) (07/19 1300) Temp Source: Oral (07/19 1300) BP: 119/53 (07/19 1316) Pulse Rate: 44 (07/19 1316)  Labs:  Recent Labs  05/09/17 1212 05/10/17 0221 05/10/17 0430 05/10/17 0718 05/10/17 1343  HGB 11.4* 11.2*  --   --   --   HCT 36.4* 35.2*  --   --   --   PLT 136* 135*  --   --   --   HEPARINUNFRC  --   --  1.18*  --  1.06*  CREATININE 1.58* 1.69*  --   --   --   TROPONINI  --  0.03*  --  0.03* 0.04*    Estimated Creatinine Clearance: 34.2 mL/min (A) (by C-G formula based on SCr of 1.69 mg/dL (H)).   Medical History: Past Medical History:  Diagnosis Date  . Asthma   . Cardiomyopathy ([place])    a. 2D echo 2012: EF 30-35%, diffuse HK, grade 1 DD, trivial pericardial effusion.  Marland Kitchen COPD (chronic obstructive pulmonary disease) ([place])   . Diabetes mellitus type 2 in nonobese ([facility])   . Former tobacco use   . LBBB (left bundle branch block)   . Prostate cancer ([place])   . Stroke [facility])     Medications:  Infusions:  . diltiazem (CARDIZEM) infusion 15 mg/hr (05/10/17 1122)  . heparin 1,000 Units/hr (05/10/17 1124)    Assessment: 18 yom with new aflutter continuing on IV heparin. CBC stable. CT negative for PE. He is not on anticoagulation PTA.  Heparin level remains high at 1.06 after rate decrease. Unclear if level was drawn appropriately. Per RN, heparin is running in right PIV and unsure of where lab was drawn from (pt with AMS). With previous high level, will decrease rate for now and f/u repeat level in 8 hours. RN states no s/sx bleeding or IV line issues.  Goal of Therapy:  Heparin level 0.3-0.7 units/ml Monitor platelets by anticoagulation protocol: Yes   Plan:  Decrease heparin gtt  to 750 units/hr Check an 8 hr heparin level Daily heparin level and CBC Monitor for s/sx bleeding F/u long-term anticoagulation plans  Elicia Lamp, PharmD, BCPS Clinical Pharmacist Rx Phone # for today: [phone] After 3:30PM, please call Main Rx: [phone] 05/10/2017 3:45 PM

## 2017-05-10 NOTE — Progress Notes (Signed)
Spoke with patient and daughter.  Has had diabetes for 40-50 years. Takes Levemir 47 units daily.  If blood sugar is 80-90 mg/dl, does not get insulin. Checks blood sugars once per day. Daughter states that he does not have hypoglycemia that much. They are not wanting to do anymore with insulin due to his age.  Wife lives with him.  Does not want to add any other insulin to home regimen, but may need less Levemir than what he was taking at home. Will follow up with PCP every 3 months.   Harvel Ricks RN BSN CDE Diabetes Coordinator Pager: [phone]  8am-5pm

## 2017-05-10 NOTE — ED Notes (Signed)
Report attempted 

## 2017-05-10 NOTE — Progress Notes (Signed)
PHARMACY NOTE:  ANTIMICROBIAL RENAL DOSAGE ADJUSTMENT  Current antimicrobial regimen includes a mismatch between antimicrobial dosage and estimated renal function.  As per policy approved by the Pharmacy & Therapeutics and Medical Executive Committees, the antimicrobial dosage will be adjusted accordingly.  Current antimicrobial dosage:  Levaquin 750mg  PO daily  Indication: COPD  Renal Function:  Estimated Creatinine Clearance: 36.6 mL/min (A) (by C-G formula based on SCr of 1.58 mg/dL (H)). []      On intermittent HD, scheduled: []      On CRRT    Antimicrobial dosage has been changed to:  Levaquin 500mg  PO daily    Thank you for allowing pharmacy to be a part of this patient's care.  Wynona Neat, PharmD, BCPS  05/10/2017 1:42 AM

## 2017-05-10 NOTE — Progress Notes (Signed)
Called [facility] [place] [facility], which is [facility] on [address], to ask about whether Dustin Leonard has been prescribed plavix and a statin due to his hx of CVA.  They confirmed that he is not prescribed those medications.  Since he has this hx of a CVA, will add the lack of these medications as a follow-up issue on his discharge summary.

## 2017-05-10 NOTE — Progress Notes (Signed)
  Echocardiogram 2D Echocardiogram has been performed.  Dustin Leonard 05/10/2017, 11:55 AM

## 2017-05-11 ENCOUNTER — Inpatient Hospital Stay (HOSPITAL_COMMUNITY): Payer: Medicare Other | Admitting: Certified Registered"

## 2017-05-11 ENCOUNTER — Inpatient Hospital Stay (HOSPITAL_COMMUNITY): Payer: Medicare Other

## 2017-05-11 ENCOUNTER — Encounter (HOSPITAL_COMMUNITY)
Admission: EM | Disposition: A | Discharge status: Home / Self Care | Payer: Self-pay | Source: Home / Self Care | Attending: Family Medicine

## 2017-05-11 ENCOUNTER — Encounter (HOSPITAL_COMMUNITY): Payer: Self-pay

## 2017-05-11 DIAGNOSIS — I4892 Unspecified atrial flutter: Secondary | ICD-10-CM

## 2017-05-11 DIAGNOSIS — I340 Nonrheumatic mitral (valve) insufficiency: Secondary | ICD-10-CM

## 2017-05-11 DIAGNOSIS — I5043 Acute on chronic combined systolic (congestive) and diastolic (congestive) heart failure: Secondary | ICD-10-CM

## 2017-05-11 DIAGNOSIS — N189 Chronic kidney disease, unspecified: Secondary | ICD-10-CM

## 2017-05-11 DIAGNOSIS — N179 Acute kidney failure, unspecified: Secondary | ICD-10-CM

## 2017-05-11 DIAGNOSIS — I4891 Unspecified atrial fibrillation: Secondary | ICD-10-CM

## 2017-05-11 HISTORY — PX: TEE WITHOUT CARDIOVERSION: SHX5443

## 2017-05-11 HISTORY — PX: CARDIOVERSION: SHX1299

## 2017-05-11 LAB — GLUCOSE, CAPILLARY
GLUCOSE-CAPILLARY: 296 mg/dL — AB (ref 65–99)
Glucose-Capillary: 352 mg/dL — ABNORMAL HIGH (ref 65–99)
Glucose-Capillary: 227 mg/dL — ABNORMAL HIGH (ref 65–99)
Glucose-Capillary: 185 mg/dL — ABNORMAL HIGH (ref 65–99)

## 2017-05-11 LAB — CBC
HCT: 33.3 % — ABNORMAL LOW (ref 39.0–52.0)
Hemoglobin: 10.5 g/dL — ABNORMAL LOW (ref 13.0–17.0)
MCH: 28.2 pg (ref 26.0–34.0)
MCHC: 31.5 g/dL (ref 30.0–36.0)
MCV: 89.3 fL (ref 78.0–100.0)
PLATELETS: 152 10*3/uL (ref 150–400)
RBC: 3.73 MIL/uL — ABNORMAL LOW (ref 4.22–5.81)
RDW: 14.2 % (ref 11.5–15.5)
WBC: 9.7 10*3/uL (ref 4.0–10.5)

## 2017-05-11 LAB — BASIC METABOLIC PANEL
Anion gap: 7 (ref 5–15)
BUN: 47 mg/dL — AB (ref 6–20)
CALCIUM: 8.6 mg/dL — AB (ref 8.9–10.3)
CO2: 21 mmol/L — ABNORMAL LOW (ref 22–32)
CREATININE: 2.27 mg/dL — AB (ref 0.61–1.24)
Chloride: 106 mmol/L (ref 101–111)
GFR calc Af Amer: 28 mL/min — ABNORMAL LOW (ref >60)
GFR, EST NON AFRICAN AMERICAN: 24 mL/min — AB (ref >60)
Glucose, Bld: 432 mg/dL — ABNORMAL HIGH (ref 65–99)
Potassium: 4.9 mmol/L (ref 3.5–5.1)
SODIUM: 134 mmol/L — AB (ref 135–145)

## 2017-05-11 LAB — HEMOGLOBIN A1C
Hgb A1c MFr Bld: 11.5 % — ABNORMAL HIGH (ref 4.8–5.6)
MEAN PLASMA GLUCOSE: 283 mg/dL

## 2017-05-11 LAB — HEPARIN LEVEL (UNFRACTIONATED)
HEPARIN UNFRACTIONATED: 0.58 [IU]/mL (ref 0.30–0.70)
HEPARIN UNFRACTIONATED: 0.46 [IU]/mL (ref 0.30–0.70)

## 2017-05-11 SURGERY — ECHOCARDIOGRAM, TRANSESOPHAGEAL
Anesthesia: General

## 2017-05-11 MED ORDER — IPRATROPIUM BROMIDE 0.02 % IN SOLN
0.5000 mg | Freq: Three times a day (TID) | RESPIRATORY_TRACT | Status: DC
  Administered 2017-05-11 – 2017-05-14 (×9): 0.5 mg via RESPIRATORY_TRACT
  Filled 2017-05-11 (×10): qty 2.5

## 2017-05-11 MED ORDER — AMIODARONE HCL IN DEXTROSE 360-4.14 MG/200ML-% IV SOLN
60.0000 mg/h | INTRAVENOUS | Status: DC
  Administered 2017-05-11: 60 mg/h via INTRAVENOUS
  Filled 2017-05-11: qty 200

## 2017-05-11 MED ORDER — IPRATROPIUM BROMIDE 0.02 % IN SOLN: 0.5000 mg | Freq: Three times a day (TID) | RESPIRATORY_TRACT | Status: DC

## 2017-05-11 MED ORDER — INSULIN DETEMIR 100 UNIT/ML ~~LOC~~ SOLN
15.0000 [IU] | Freq: Two times a day (BID) | SUBCUTANEOUS | Status: DC
  Administered 2017-05-11 – 2017-05-12 (×3): 15 [IU] via SUBCUTANEOUS
  Filled 2017-05-11 (×3): qty 0.15

## 2017-05-11 MED ORDER — DIGOXIN 0.25 MG/ML IJ SOLN
0.2500 mg | Freq: Once | INTRAMUSCULAR | Status: AC
  Administered 2017-05-11: 0.25 mg via INTRAVENOUS
  Filled 2017-05-11: qty 2

## 2017-05-11 MED ORDER — LEVALBUTEROL HCL 0.63 MG/3ML IN NEBU
0.6300 mg | INHALATION_SOLUTION | Freq: Three times a day (TID) | RESPIRATORY_TRACT | Status: DC
  Administered 2017-05-11 – 2017-05-14 (×9): 0.63 mg via RESPIRATORY_TRACT
  Filled 2017-05-11 (×10): qty 3

## 2017-05-11 MED ORDER — AMIODARONE LOAD VIA INFUSION
150.0000 mg | Freq: Once | INTRAVENOUS | Status: DC
  Filled 2017-05-11: qty 83.34

## 2017-05-11 MED ORDER — SODIUM CHLORIDE 0.9 % IV SOLN
INTRAVENOUS | Status: DC
  Administered 2017-05-11: 14:00:00 via INTRAVENOUS

## 2017-05-11 MED ORDER — BUTAMBEN-TETRACAINE-BENZOCAINE 2-2-14 % EX AERO
INHALATION_SPRAY | CUTANEOUS | Status: DC | PRN
  Administered 2017-05-11: 2 via TOPICAL

## 2017-05-11 MED ORDER — PROPOFOL 500 MG/50ML IV EMUL
INTRAVENOUS | Status: DC | PRN
  Administered 2017-05-11: 100 ug/kg/min via INTRAVENOUS

## 2017-05-11 MED ORDER — AMIODARONE HCL IN DEXTROSE 360-4.14 MG/200ML-% IV SOLN
30.0000 mg/h | INTRAVENOUS | Status: DC
  Administered 2017-05-11 – 2017-05-12 (×2): 30 mg/h via INTRAVENOUS
  Filled 2017-05-11 (×2): qty 200

## 2017-05-11 MED ORDER — LEVALBUTEROL HCL 1.25 MG/0.5ML IN NEBU: 1.2500 mg | INHALATION_SOLUTION | Freq: Four times a day (QID) | RESPIRATORY_TRACT | Status: DC | PRN

## 2017-05-11 MED ORDER — FUROSEMIDE 10 MG/ML IJ SOLN
40.0000 mg | Freq: Once | INTRAMUSCULAR | Status: AC
  Administered 2017-05-11: 40 mg via INTRAVENOUS
  Filled 2017-05-11: qty 4

## 2017-05-11 MED ORDER — PROPOFOL 10 MG/ML IV BOLUS
INTRAVENOUS | Status: DC | PRN
  Administered 2017-05-11 (×3): 20 mg via INTRAVENOUS

## 2017-05-11 NOTE — Transfer of Care (Signed)
Immediate Anesthesia Transfer of Care Note  Patient: Dustin Leonard  Procedure(s) Performed: Procedure(s): TRANSESOPHAGEAL ECHOCARDIOGRAM (TEE) (N/A) CARDIOVERSION (N/A)  Patient Location: Endoscopy Unit  Anesthesia Type:General  Level of Consciousness: sedated  Airway & Oxygen Therapy: Patient Spontanous Breathing and Patient connected to nasal cannula oxygen  Post-op Assessment: Report given to RN, Post -op Vital signs reviewed and stable and Patient moving all extremities  Post vital signs: Reviewed and stable  Last Vitals:  Vitals:   05/11/17 1149 05/11/17 1326  BP: 132/77 (!) 142/65  Pulse: (!) 119 (!) 121  Resp: (!) 23 (!) 25  Temp: (!) 36.4 C 36.9 C    Last Pain:  Vitals:   05/11/17 1326  TempSrc: Oral  PainSc:          Complications: No apparent anesthesia complications

## 2017-05-11 NOTE — H&P (View-Only) (Signed)
Progress Note  Patient Name: Dustin Leonard Date of Encounter: 05/11/2017  Primary Cardiologist: Remotely seen by Dr. Terrence Dupont 2012 - I contacted his office and he recommended [facility] see the patient in case EP involvement is needed.  Subjective   Denies any symptoms at all, no chest pain or dyspnea. Resting comfortably, new R hand tremor noted which daughter and son state comes and goes. Daughter insists he currently looks fine, very skeptical of putting her father through procedures when TEE/DCCV briefly discussed.   Inpatient Medications    Scheduled Meds: . aspirin  81 mg Oral Daily  . furosemide  40 mg Oral Daily  . insulin aspart  0-9 Units Subcutaneous TID WC  . insulin detemir  15 Units Subcutaneous BID  . ipratropium  0.5 mg Nebulization QID  . levalbuterol  0.63 mg Nebulization QID  . levofloxacin  500 mg Oral Daily  . mometasone-formoterol  2 puff Inhalation BID  . predniSONE  40 mg Oral Q breakfast  . sodium chloride flush  3 mL Intravenous Q12H   Continuous Infusions: . diltiazem (CARDIZEM) infusion 15 mg/hr (05/11/17 0736)  . heparin 750 Units/hr (05/10/17 1633)   PRN Meds: levalbuterol   Vital Signs    Vitals:   05/11/17 0718 05/11/17 0740 05/11/17 1139 05/11/17 1149  BP: 115/77   132/77  Pulse: (!) 117   (!) 119  Resp: 20   (!) 23  Temp: 98.5 F (36.9 C)   (!) 97.5 F (36.4 C)  TempSrc: Oral   Oral  SpO2: 100% 100% 100% 100%  Weight:      Height:        Intake/Output Summary (Last 24 hours) at 05/11/17 1224 Last data filed at 05/11/17 1149  Gross per 24 hour  Intake          1408.88 ml  Output             1575 ml  Net          -166.12 ml   Filed Weights   05/09/17 1156 05/10/17 0052 05/11/17 0301  Weight: 200 lb (90.7 kg) 206 lb 1.6 oz (93.5 kg) 187 lb (84.8 kg)    Telemetry    Appears to be atrial flutter 2:1 conduction - Personally Reviewed  Physical Exam   GEN: elderly AAM No acute distress.  HEENT: Normocephalic, atraumatic, sclera  non-icteric. Significant R cataract Neck: No JVD or bruits. Cardiac: RRR no murmurs, rubs, or gallops.  Radials/DP/PT 1+ and equal bilaterally.  Respiratory: Moderate-good air movement, decreased wheezing compared to yesterday but still present. Breathing is unlabored lying 25 degree angle. GI: Soft, nontender, non-distended, BS +x 4. MS: no deformity. Extremities: No clubbing or cyanosis. Trace-1+ BLE edema. Distal pedal pulses are 2+ and equal bilaterally. Neuro:  AAOx3. Follows commands. Psych:  Responds to questions appropriately with a flattened but pleasant affect.  Labs    Chemistry Recent Labs Lab 05/09/17 1212 05/10/17 0221 05/11/17 0345  NA 136 134* 134*  K 5.0 5.1 4.9  CL 107 106 106  CO2 22 18* 21*  GLUCOSE 268* 515* 432*  BUN 22* 34* 47*  CREATININE 1.58* 1.69* 2.27*  CALCIUM 8.8* 8.5* 8.6*  PROT 6.4*  --   --   ALBUMIN 3.3*  --   --   AST 14*  --   --   ALT 10*  --   --   ALKPHOS 77  --   --   BILITOT 0.9  --   --  GFRNONAA 38* 35* 24*  GFRAA 44* 41* 28*  ANIONGAP 7 10 7      Hematology Recent Labs Lab 05/09/17 1212 05/10/17 0221 05/11/17 0345  WBC 7.5 7.7 9.7  RBC 4.03* 3.90* 3.73*  HGB 11.4* 11.2* 10.5*  HCT 36.4* 35.2* 33.3*  MCV 90.3 90.3 89.3  MCH 28.3 28.7 28.2  MCHC 31.3 31.8 31.5  RDW 14.0 13.9 14.2  PLT 136* 135* 152    Cardiac Enzymes Recent Labs Lab 05/10/17 0221 05/10/17 0718 05/10/17 1343  TROPONINI 0.03* 0.03* 0.04*    Recent Labs Lab 05/09/17 1239  TROPIPOC 0.01     BNP Recent Labs Lab 05/10/17 0718  BNP 590.2*     DDimer  Recent Labs Lab 05/09/17 1212  DDIMER 0.70*     Radiology    Ct Angio Chest Pe W And/or Wo Contrast  Result Date: 05/09/2017 CLINICAL DATA:  Shortness of Breath EXAM: CT ANGIOGRAPHY CHEST WITH CONTRAST TECHNIQUE: Multidetector CT imaging of the chest was performed using the standard protocol during bolus administration of intravenous contrast. Multiplanar CT image reconstructions and  MIPs were obtained to evaluate the vascular anatomy. CONTRAST:  80 mL Isovue 370 nonionic COMPARISON:  Chest radiograph May 09, 2017 FINDINGS: Cardiovascular: There is no demonstrable pulmonary embolus. The main pulmonary outflow tract measures 3.1 cm in diameter, a finding felt to be indicative of a degree of pulmonary arterial hypertension. There is no appreciable thoracic aortic aneurysm. While no dissection of the aorta is seen, the contrast bolus was not timed to allow for confident assessment of the thoracic aorta for potential dissection. There are foci of calcification in the proximal visualized great vessels. There are multiple foci of atherosclerotic calcification in the aorta. There are foci of calcification in several coronary arteries. The pericardium is not appreciably thickened. Mediastinum/Nodes: Thyroid appears unremarkable. There is no appreciable thoracic adenopathy. Scattered subcentimeter mediastinal lymph nodes are noted. There is a small hiatal hernia. Lungs/Pleura: There is scarring in the right apex. There are pleural effusions tracking along each major fissure. There is patchy bibasilar atelectatic change. There is mild interstitial edema in the lower lung zones. There is no frank airspace consolidation. Upper Abdomen: There is atherosclerotic calcification in the aorta. There is cholelithiasis. There is slight reflux of contrast into the inferior vena cava with trace contrast refluxed into the hepatic veins. Musculoskeletal: There is midthoracic dextroscoliosis. There is degenerative change in the thoracic spine. There are no blastic or lytic bone lesions. Review of the MIP images confirms the above findings. IMPRESSION: 1. No evident pulmonary embolus. Prominence of the main pulmonary outflow tract may indicate a degree of pulmonary arterial hypertension. 2. No thoracic aortic aneurysm. No dissection evident. Note that the contrast bolus is not timed to optimize assessment for potential  thoracic aortic dissection. There are foci of atherosclerotic calcification in the aorta and great vessels. There are foci of coronary artery calcification. 3. Findings felt to represent a degree of congestive heart failure. No airspace consolidation. 4.  No appreciable adenopathy. 5. Slight reflux of contrast into the inferior vena cava and hepatic veins may indicate a degree of increase in right heart pressure. 6.  Cholelithiasis. Aortic Atherosclerosis (ICD10-I70.0). Electronically Signed   By: Lowella Grip III M.D.   On: 05/09/2017 16:20    Cardiac Studies   2d echo 05/10/17 Study Conclusions - Left ventricle: The cavity size was normal. Wall thickness was   increased in a pattern of mild LVH. Systolic function was   moderately to severely  reduced. The estimated ejection fraction   was in the range of 30% to 35%. Dyskinesis of the anteroseptal   myocardium and akinesis of the inferior and inferoseptum. Doppler   parameters are consistent with high ventricular filling pressure. - Aortic valve: Transvalvular velocity was within the normal range.   There was no stenosis. There was trivial regurgitation. - Mitral valve: Mildly calcified annulus. Transvalvular velocity   was within the normal range. There was no evidence for stenosis.   There was mild regurgitation. - Left atrium: The atrium was moderately dilated. - Right ventricle: The cavity size was normal. Wall thickness was   normal. Systolic function was normal. - Pulmonary arteries: Systolic pressure was mildly increased. PA   peak pressure: 46 mm Hg (S).  Patient Profile     83 y.o. male with COPD, stroke 2012 (pt reports 2 prior strokes total), cardiomyopathy dx at time of stroke (has not had further w/u), known LBBB, asthma, 20-30 yrs prior tobacco use, prostate CA, DM admitted with 1 week hx of SOB, cough, dx with AECOPD and new onset atrial flutter RVR.   Assessment & Plan    1. Shortness of breath/wheezing/cough,  suspect multifactorial due to AECOPD, also compounded by newly recognized atrial flutter RVR and prior known cardiomyopathy - duration of atrial flutter unknown but has been feeling unwell for about a week. Not previously on anticoagulation - at some point in 2012 was on Plavix. Mild thrombocytopenia noted but patient denies prior h/o bleeding. He's currently on heparin. Given his AKI, would need to consider renally adjusted medication if changing to oral - will need to follow trajectory of Cr before selecting agent. Diltiazem is being used for rate control currently but he went into 2:1 conduction yesterday without significant improvement since. Longterm diltiazem is not the best agent given his cardiomyopathy but BB not yet initiated given his wheezing. No change to digoxin overnight (poor choice given renal issues). I will discuss further strategy with Dr. Debara Pickett. Daughter remains largely skeptical of any invasive procedures and continues to affirm her father is doing just fine.  2. Acute on chronic systolic CHF - family continues to report they feel he is at baseline. Unclear accuracy of weights 200-205-187. I/O's +546 with only 900 UOP yesterday. Received 40mg  oral Lasix yesterday by IM, 1 dose IV by cardiology, then today received 40mg  oral and 40mg  IV per primary team. Would hold further diuretic given acute rise in Cr. See above re: BB. Given AKI would ACEI/ARB/ARNI/spironolactone for now, could be considered in the future.  3. 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 Cr rising. Would hold further diuretics. Avoid nephrotoxic agents.  4. Marginally elevated troponin - suspected demand ischemia, but no prior ischemic eval. In talking briefly with family it does not sound like anyone in the room is excited about putting him through procedures. Would continue aspirin for now.  Signed, Charlie Pitter, PA-C  05/11/2017, 12:24 PM

## 2017-05-11 NOTE — Progress Notes (Signed)
ANTICOAGULATION CONSULT NOTE - Follow Up Consult  Pharmacy Consult for Heparin Indication: aflutter  No Known Allergies  Patient Measurements: Height: 5\' 7"  (170.2 cm) Weight: 187 lb (84.8 kg) IBW/kg (Calculated) : 66.1  Vital Signs: Temp: 98.5 F (36.9 C) (07/20 0718) Temp Source: Oral (07/20 0718) BP: 115/77 (07/20 0718) Pulse Rate: 117 (07/20 0718)  Labs:  Recent Labs  05/09/17 1212 05/10/17 0221  05/10/17 0718 05/10/17 1343 05/11/17 0058 05/11/17 0345  HGB 11.4* 11.2*  --   --   --   --  10.5*  HCT 36.4* 35.2*  --   --   --   --  33.3*  PLT 136* 135*  --   --   --   --  152  HEPARINUNFRC  --   --   < >  --  1.06* 0.58 0.46  CREATININE 1.58* 1.69*  --   --   --   --  2.27*  TROPONINI  --  0.03*  --  0.03* 0.04*  --   --   < > = values in this interval not displayed.  Estimated Creatinine Clearance: 24.3 mL/min (A) (by C-G formula based on SCr of 2.27 mg/dL (H)).   Medications:  Heparin @ 750 units/hr  Assessment: Dustin Leonard continues on heparin for new aflutter. Heparin level is therapeutic at 0.46. CBC stable. No bleeding.  Goal of Therapy:  Heparin level 0.3-0.7 units/ml Monitor platelets by anticoagulation protocol: Yes   Plan:  1) Continue heparin at 750 units/hr 2) Daily heparin level and CBC 3) Follow up oral anticoagulation plan  Deboraha Sprang 05/11/2017,9:32 AM

## 2017-05-11 NOTE — Progress Notes (Signed)
Family Medicine Teaching Service Daily Progress Note Intern Pager: [phone]  Patient name: Dustin Leonard Medical record number: [id number] Date of birth: 03/26/1929 Age: 83 y.o. Gender: male  Primary Care Provider: Katherina Mires, MD  Consultants: Cardiology Code Status: FULL  Pt Overview and Major Events to Date:  Dustin Leonard is a 83 year old male who presented on 05/09/17 for a one week hx of SOB, wheezing, and nonproductive cough.  He has a PMH significant for glaucoma, COPD, prostate cancer, stroke, TIA, and T2DM.  For the past week, he has been using duonebs about 3 x per day and albuterol inhaler 2 x per day.  In the ED, he received solumedrol and duoneb and noted improvement.  He also was found to have atrial flutter in the ED, so he was given metoprolol then a diltiazem drip.  Assessment and Plan:  Dustin Leonard is a 83 y.o. male presenting with SOB x 1 week . PMH is significant for glaucoma, COPD, prostate cancer, stroke, TIA, and T2DM.    SOB Patient complains of SOB x 1 week. Likely a combination of COPD exacerbation and fluid overload. Patient had bilateral expiratory wheezing and slight crackles on exam. Patient had 1+ pitting edema, unsure if he took morning lasix today. CXR showed cardiac enlargement with bibasilar atelectasis and atherosclerotic aorta. CT angio showed no evident pulmonary embolus, prominence of the main pulmonary outflow tract indicating possible pulmonary arterial hypertension, and findings representative of a degree of congestive heart failure.  According to patient, he has no known history of CHF.  Had elevated BNP to 300 in Sept 2017. From chart review, echo obtained during admission for CVA in 2012 showed EF 30-35% and diffuse hypokinesis. Patient reports he has not been followed by cardiology.  Patient reports he takes his Lasix 40 mg PO daily for chronic LE edema. Low suspicion for infectious process given lack of fever, lack of leukocytosis, and no  infiltrate on CXR.  BNP 590 on 7/19, up from 312 last year.  EKG on 7/19 showed atrial flutter, variable AV block, T wave ischemia.  Echo on 7/19 revealed EF of 30-35% (same as on previous echo) and severely reduced LV function.  Digoxin was given once on 7/19 by cardiology.  40 mg IV Lasix given on 7/20 for volume overload. -appreciate cardiology recommendations -continuous pulse ox -re-assess volume status daily, more lasix if needed -ipratropium q6h  -levalbuterol q6h/1h prn (avoiding albuterol due to patient's tachycardia)  -levofloxacin 750 mg  -dulera bid, initiating controller medication as patient had hospitalization for COPD exacerbation within the past year  -prednisone 40 mg daily -daily weights -strict I's and O's   New onset Atrial flutter  Patient presented to ED with new onset atrial flutter. HR of 131. Was put on Cardizem drip in ED with resulting correction of heart rate. Tachycardia likely worsened in the setting of albuterol treatment. CHADSVASC score of 6. No history of bleeding.  TSH wnl at 0.437.  Troponins remained low and flat.  Still tachycardic into 110's on 7/20 am. -continue Cardizem drip -continue to monitor  -heparin gtt per pharmacy  -cardiology continuing to follow - follow their recommendations for tachycardia    AKI: Cr 1.58 (bl 0.9-1.0). May be pre-renal as concern that patient is currently volume overloaded and continues to be overloaded during admission. Cr 2.27 on 7/20. -consider urine studies if no improvement  -holding Ramipril    COPD Patient has history of COPD. Was followed by Dr. Annamaria Boots, pulmonologist, several years  ago but has not had follow up recent due to no recent SOB. CXR showed cardiac enlargement with bibasilar atelectasis and atherosclerotic aorta. -breathing treatments as above     T2DM Patient on home levemir 47 units prn if glucose over 190. Last hemoglobin A1c 10 (Sept 2017).  A1C checked on this hospitalization is 11.5.   Diabetes coordinator spoke with patient and his family on 7/19 and they wished to continue his current home regimen.  Glucose at 0345 on 7/20 was 432; multiple high readings recorded.  Received 31 units of Novolog over last 24 hours along with 20 U Levemir -levemir 20 units daily - increase to 15U BID -sliding scale  -CBGs AC/HS  -Hemoglobin A1c    History of CVA: Reportedly without residual deficits.  -continue ASA 81 mg  -care everywhere PCP note states he is on Plavix and a statin, confirm with pharmacy in AM and restart as needed   FEN/GI: carb modified diet  Prophylaxis: heparin    Disposition: Home  Subjective:  Dustin Leonard says he is feeling well this morning but continues to feel a little short of breath.  He denies heart palpitations or chest pain.  Talked with wife and daughter and bedside who were concerned about his hand swelling.  His daughter also asked if he would be able to take diltiazem at home, and I told her that it does come in a pill form, but we would see what cardiology recommended before we knew whether he would take it at home.  Objective: Temp:  [97.5 F (36.4 C)-98.4 F (36.9 C)] 98.3 F (36.8 C) (07/20 0301) Pulse Rate:  [44-117] 117 (07/20 0400) Resp:  [19-25] 19 (07/20 0400) BP: (113-122)/(53-74) 113/74 (07/20 0301) SpO2:  [98 %-100 %] 98 % (07/20 0400) Weight:  [187 lb (84.8 kg)] 187 lb (84.8 kg) (07/20 0301) Physical Exam: General: pleasant, comfortable appearing elderly man sitting in chair Cardiovascular: regular rhythm, tachycardic, no MRG Respiratory: prolonged expiratory wheeze, no crackles Abdomen: soft, nontender, +bowel sounds Extremities: 2+ edema in lower extremities, left worse than right.  Bilateral hands are swollen. Neuro: AAOx3  Laboratory:  Recent Labs Lab 05/09/17 1212 05/10/17 0221 05/11/17 0345  WBC 7.5 7.7 9.7  HGB 11.4* 11.2* 10.5*  HCT 36.4* 35.2* 33.3*  PLT 136* 135* 152    Recent Labs Lab 05/09/17 1212  05/10/17 0221 05/11/17 0345  NA 136 134* 134*  K 5.0 5.1 4.9  CL 107 106 106  CO2 22 18* 21*  BUN 22* 34* 47*  CREATININE 1.58* 1.69* 2.27*  CALCIUM 8.8* 8.5* 8.6*  PROT 6.4*  --   --   BILITOT 0.9  --   --   ALKPHOS 77  --   --   ALT 10*  --   --   AST 14*  --   --   GLUCOSE 268* 515* 432*      Imaging/Diagnostic Tests: ECHO on 05/10/17:  Study Conclusions  - Left ventricle: The cavity size was normal. Wall thickness was   increased in a pattern of mild LVH. Systolic function was   moderately to severely reduced. The estimated ejection fraction   was in the range of 30% to 35%. Dyskinesis of the anteroseptal   myocardium and akinesis of the inferior and inferoseptum. Doppler   parameters are consistent with high ventricular filling pressure. - Aortic valve: Transvalvular velocity was within the normal range.   There was no stenosis. There was trivial regurgitation. - Mitral valve: Mildly calcified annulus.  Transvalvular velocity   was within the normal range. There was no evidence for stenosis.   There was mild regurgitation. - Left atrium: The atrium was moderately dilated. - Right ventricle: The cavity size was normal. Wall thickness was   normal. Systolic function was normal. - Pulmonary arteries: Systolic pressure was mildly increased. PA   peak pressure: 46 mm Hg (S).  Kathrene Alu, MD 05/11/2017, 7:16 AM PGY-1, [place] Intern pager: [phone], text pages welcome

## 2017-05-11 NOTE — Interval H&P Note (Signed)
History and Physical Interval Note:  05/11/2017 1:54 PM  Dustin Leonard  has presented today for surgery, with the diagnosis of atrial flutter  The various methods of treatment have been discussed with the patient and family. After consideration of risks, benefits and other options for treatment, the patient has consented to  Procedure(s): TRANSESOPHAGEAL ECHOCARDIOGRAM (TEE) (N/A) CARDIOVERSION (N/A) as a surgical intervention .  The patient's history has been reviewed, patient examined, no change in status, stable for surgery.  I have reviewed the patient's chart and labs.  Questions were answered to the patient's satisfaction.     Mertie Moores

## 2017-05-11 NOTE — CV Procedure (Signed)
    Transesophageal Echocardiogram Note  Manson Luckadoo [id number] Oct 05, 1929  Procedure: Transesophageal Echocardiogram Indications: rapid atrial fib   Procedure Details Consent: Obtained Time Out: Verified patient identification, verified procedure, site/side was marked, verified correct patient position, special equipment/implants available, Radiology Safety Procedures followed,  medications/allergies/relevent history reviewed, required imaging and test results available.  Performed  Medications:  During this procedure the patient is administered Propofol IV drip ( for TEE and cardioversion)  162 mg iv .   Left Ventrical:  Low normal LV function   Mitral Valve: mild MR   Aortic Valve: normal   Tricuspid Valve: mild TR   Pulmonic Valve: poorly vis  Left Atrium/ Left atrial appendage: no LA or LAA thrombus   Atrial septum: not vis   Aorta:  + mobile atheroma    Complications: No apparent complications Patient did tolerate procedure well.   We proceded with cardioversion at this point       Cardioversion Note  Aurelio Mccamy [id number] [birth year]  Procedure: DC Cardioversion Indications: Atrial fib   Procedure Details Consent: Obtained Time Out: Verified patient identification, verified procedure, site/side was marked, verified correct patient position, special equipment/implants available, Radiology Safety Procedures followed,  medications/allergies/relevent history reviewed, required imaging and test results available.  Performed  The patient has been on adequate anticoagulation.  The patient received IV Propofol 162 mg for TEE and cardioversion  for sedation.  Synchronous cardioversion was performed at 120  joules.  The cardioversion was successful.     Complications: No apparent complications Patient did tolerate procedure well.   Thayer Headings, Brooke Bonito., MD, [facility] 05/11/2017, 2:36 PM

## 2017-05-11 NOTE — Progress Notes (Signed)
Inpatient Diabetes Program Recommendations  AACE/ADA: New Consensus Statement on Inpatient Glycemic Control (2015)  Target Ranges:  Prepandial:   less than 140 mg/dL      Peak postprandial:   less than 180 mg/dL (1-2 hours)      Critically ill patients:  140 - 180 mg/dL   Results for ALMER, BUSHEY (MRN [id number]) as of 05/11/2017 09:23  Ref. Range 05/10/2017 08:05 05/10/2017 13:14 05/10/2017 16:55 05/10/2017 21:03 05/11/2017 07:46  Glucose-Capillary Latest Ref Range: 65 - 99 mg/dL 368 (H) 211 (H) 358 (H) 466 (H) 352 (H)   Review of Glycemic Control  Current orders for Inpatient glycemic control: Levemir 15 units BID, Novolog 0-9 units TID with meals  Inpatient Diabetes Program Recommendations: Insulin - Basal: Noted Levemir changed from 20 units daily to 15 units BID and will be started this morning. Correction (SSI): Please consider ordering Novolog 0-5 units QHS for bedtime correction. Insulin - Meal Coverage: While inpatient and ordered steroids and if appropriate, please consider ordering Novolog 4 units TID with meals for meal coverage if patient eats at least 50% of meals.  Thanks, Barnie Alderman, RN, MSN, CDE Diabetes Coordinator Inpatient Diabetes Program [phone] (Team Pager from 8am to 5pm)

## 2017-05-11 NOTE — Evaluation (Signed)
Occupational Therapy Evaluation and Discharge Patient Details Name: Dustin Leonard MRN: [id number] DOB: [birth year] Today's Date: 05/11/2017    History of Present Illness Dustin Leonard is a 83 year old male who presented on 05/09/17 for a one week hx of SOB, wheezing, and nonproductive cough.  He has a PMH significant for glaucoma, COPD, prostate cancer, stroke, TIA, and T2DM.  For the past week, he has been using duonebs about 3 x per day and albuterol inhaler 2 x per day.  In the ED, he received solumedrol and duoneb and noted improvement.  He also was found to have atrial flutter in the ED, so he was given metoprolol then a diltiazem drip.   Clinical Impression   Pt's daughter quite protective of her father. Reports he is near his baseline and does not need therapy. He currently requires min assist for safety due to low vision and unfamiliar environment. Daughter declining further OT, stating the family will help him at home. Signing off.    Follow Up Recommendations  No OT follow up    Equipment Recommendations  None recommended by OT    Recommendations for Other Services       Precautions / Restrictions Precautions Precautions: Fall Restrictions Weight Bearing Restrictions: No      Mobility Bed Mobility               General bed mobility comments: pt in chair  Transfers Overall transfer level: Needs assistance Equipment used: Rolling walker (2 wheeled)   Sit to Stand: Min assist         General transfer comment: cues to scoot to edge of chair, min assist to rise and gain balance    Balance Overall balance assessment: Needs assistance   Sitting balance-Leahy Scale: Good       Standing balance-Leahy Scale: Poor Standing balance comment: relies on UE support for balance, at least one hand                           ADL either performed or assessed with clinical judgement   ADL Overall ADL's : At baseline                                        General ADL Comments: daughter has been supervising him here, reports he has been walking to the bathroom and performed pericare     Vision Baseline Vision/History: Legally blind (L eye, 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) Patient Visual Report: No change from baseline       Perception     Praxis      Pertinent Vitals/Pain Pain Assessment: No/denies pain     Hand Dominance Right   Extremity/Trunk Assessment Upper Extremity Assessment Upper Extremity Assessment: Overall WFL for tasks assessed   Lower Extremity Assessment Lower Extremity Assessment: Defer to PT evaluation   Cervical / Trunk Assessment Cervical / Trunk Assessment: Normal   Communication Communication Communication: No difficulties   Cognition Arousal/Alertness: Awake/alert Behavior During Therapy: Flat affect Overall Cognitive Status: Within Functional Limits for tasks assessed                                     General Comments       Exercises     Shoulder Instructions      Home  Living Family/patient expects to be discharged to:: Private residence Living Arrangements: Spouse/significant other Available Help at Discharge: Family;Available 24 hours/day (wife and daughter) Type of Home: House Home Access: Stairs to enter [employer] of Steps: 4 Entrance Stairs-Rails: Right;Left;Can reach both Home Layout: Two level;Able to live on main level with bedroom/bathroom;Laundry or work area in basement     [employer] Shower/Tub: Teacher, early years/pre: Handicapped [place]: Environmental consultant - 2 wheels;Grab bars - tub/shower;Shower seat;Grab bars - toilet   Additional Comments: 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      Prior Functioning/Environment Level of Independence: Needs assistance  Gait / Transfers Assistance Needed: RW for mobility PTA ADL's / Homemaking Assistance Needed: supervision for showering, dresses, grooms and self  feeds independently, wife or daughter perform IADL:   Comments: Uses RW as needed per daughter so there are times he uses nothing        OT Problem List: Impaired balance (sitting and/or standing)      OT Treatment/Interventions:      OT Goals(Current goals can be found in the care plan section) Acute Rehab OT Goals Patient Stated Goal: to go home  OT Frequency:     Barriers to D/C:            Co-evaluation              AM-PAC PT "6 Clicks" Daily Activity     Outcome Measure Help from another person eating meals?: A Little Help from another person taking care of personal grooming?: A Little Help from another person toileting, which includes using toliet, bedpan, or urinal?: A Little Help from another person bathing (including washing, rinsing, drying)?: A Little Help from another person to put on and taking off regular upper body clothing?: A Little Help from another person to put on and taking off regular lower body clothing?: A Little 6 Click Score: 18   End of Session Equipment Utilized During Treatment: Gait belt;Rolling walker  Activity Tolerance: Patient tolerated treatment well Patient left: in chair;with call bell/phone within reach;with family/visitor present  OT Visit Diagnosis: Unsteadiness on feet (R26.81);Muscle weakness (generalized) (M62.81)                Time: 1027-2536 OT Time Calculation (min): 13 min Charges:  OT General Charges $OT Visit: 1 Procedure OT Evaluation $OT Eval Moderate Complexity: 1 Procedure G-Codes:      Malka So 05/11/2017, 8:50 AM  [phone]

## 2017-05-11 NOTE — Progress Notes (Signed)
  Echocardiogram Echocardiogram Transesophageal has been performed.  Jennette Dubin 05/11/2017, 2:45 PM

## 2017-05-11 NOTE — Plan of Care (Signed)
Problem: Physical Regulation: Goal: Ability to maintain clinical measurements within normal limits will improve Outcome: Not Progressing Heart rate elevated 116 range, on 15mg  of cardizem. Digoxin given.   Comments: Patient up in the chair yesterday, continues on heparin and cardizem. Therapeutic on heparin. HR continues over 100 Atrial flutter.

## 2017-05-11 NOTE — Progress Notes (Signed)
ANTICOAGULATION CONSULT NOTE - Follow Up Consult  Pharmacy Consult for Heparin  Indication: atrial fibrillation  No Known Allergies  Patient Measurements: Height: 5\' 7"  (170.2 cm) Weight: 206 lb 1.6 oz (93.5 kg) IBW/kg (Calculated) : 66.1  Vital Signs: Temp: 98.4 F (36.9 C) (07/19 2343) Temp Source: Oral (07/19 2343) BP: 118/71 (07/19 2343) Pulse Rate: 117 (07/19 2343)  Labs:  Recent Labs  05/09/17 1212 05/10/17 0221 05/10/17 0430 05/10/17 0718 05/10/17 1343 05/11/17 0058  HGB 11.4* 11.2*  --   --   --   --   HCT 36.4* 35.2*  --   --   --   --   PLT 136* 135*  --   --   --   --   HEPARINUNFRC  --   --  1.18*  --  1.06* 0.58  CREATININE 1.58* 1.69*  --   --   --   --   TROPONINI  --  0.03*  --  0.03* 0.04*  --     Estimated Creatinine Clearance: 34.2 mL/min (A) (by C-G formula based on SCr of 1.69 mg/dL (H)).    Assessment: 83 y/o M on heparin for aflutter, heparin level therapeutic x 1 after rate decrease  Goal of Therapy:  Heparin level 0.3-0.7 units/ml Monitor platelets by anticoagulation protocol: Yes   Plan:  -Cont heparin 750 units/hr -Heparin level with AM labs  Narda Bonds 05/11/2017,2:22 AM

## 2017-05-11 NOTE — Progress Notes (Signed)
Family medicine progress note  Saw and evaluated patient. Doing very well sp cardioversion and starting amio gtt. HR in 80s. BP 140s/80s. No complaints. Able to tolerate dinner. Will continue to follow closely.  Guadalupe Dawn MD PGY-1 Family Medicine Residency

## 2017-05-11 NOTE — Progress Notes (Signed)
Progress Note  Patient Name: Dustin Leonard Date of Encounter: 05/11/2017  Primary Cardiologist: Remotely seen by Dr. Terrence Dupont 2012 - I contacted his office and he recommended [facility] see the patient in case EP involvement is needed.  Subjective   Denies any symptoms at all, no chest pain or dyspnea. Resting comfortably, new R hand tremor noted which daughter and son state comes and goes. Daughter insists he currently looks fine, very skeptical of putting her father through procedures when TEE/DCCV briefly discussed.   Inpatient Medications    Scheduled Meds: . aspirin  81 mg Oral Daily  . furosemide  40 mg Oral Daily  . insulin aspart  0-9 Units Subcutaneous TID WC  . insulin detemir  15 Units Subcutaneous BID  . ipratropium  0.5 mg Nebulization QID  . levalbuterol  0.63 mg Nebulization QID  . levofloxacin  500 mg Oral Daily  . mometasone-formoterol  2 puff Inhalation BID  . predniSONE  40 mg Oral Q breakfast  . sodium chloride flush  3 mL Intravenous Q12H   Continuous Infusions: . diltiazem (CARDIZEM) infusion 15 mg/hr (05/11/17 0736)  . heparin 750 Units/hr (05/10/17 1633)   PRN Meds: levalbuterol   Vital Signs    Vitals:   05/11/17 0718 05/11/17 0740 05/11/17 1139 05/11/17 1149  BP: 115/77   132/77  Pulse: (!) 117   (!) 119  Resp: 20   (!) 23  Temp: 98.5 F (36.9 C)   (!) 97.5 F (36.4 C)  TempSrc: Oral   Oral  SpO2: 100% 100% 100% 100%  Weight:      Height:        Intake/Output Summary (Last 24 hours) at 05/11/17 1224 Last data filed at 05/11/17 1149  Gross per 24 hour  Intake          1408.88 ml  Output             1575 ml  Net          -166.12 ml   Filed Weights   05/09/17 1156 05/10/17 0052 05/11/17 0301  Weight: 200 lb (90.7 kg) 206 lb 1.6 oz (93.5 kg) 187 lb (84.8 kg)    Telemetry    Appears to be atrial flutter 2:1 conduction - Personally Reviewed  Physical Exam   GEN: elderly AAM No acute distress.  HEENT: Normocephalic, atraumatic, sclera  non-icteric. Significant R cataract Neck: No JVD or bruits. Cardiac: RRR no murmurs, rubs, or gallops.  Radials/DP/PT 1+ and equal bilaterally.  Respiratory: Moderate-good air movement, decreased wheezing compared to yesterday but still present. Breathing is unlabored lying 25 degree angle. GI: Soft, nontender, non-distended, BS +x 4. MS: no deformity. Extremities: No clubbing or cyanosis. Trace-1+ BLE edema. Distal pedal pulses are 2+ and equal bilaterally. Neuro:  AAOx3. Follows commands. Psych:  Responds to questions appropriately with a flattened but pleasant affect.  Labs    Chemistry Recent Labs Lab 05/09/17 1212 05/10/17 0221 05/11/17 0345  NA 136 134* 134*  K 5.0 5.1 4.9  CL 107 106 106  CO2 22 18* 21*  GLUCOSE 268* 515* 432*  BUN 22* 34* 47*  CREATININE 1.58* 1.69* 2.27*  CALCIUM 8.8* 8.5* 8.6*  PROT 6.4*  --   --   ALBUMIN 3.3*  --   --   AST 14*  --   --   ALT 10*  --   --   ALKPHOS 77  --   --   BILITOT 0.9  --   --  GFRNONAA 38* 35* 24*  GFRAA 44* 41* 28*  ANIONGAP 7 10 7      Hematology Recent Labs Lab 05/09/17 1212 05/10/17 0221 05/11/17 0345  WBC 7.5 7.7 9.7  RBC 4.03* 3.90* 3.73*  HGB 11.4* 11.2* 10.5*  HCT 36.4* 35.2* 33.3*  MCV 90.3 90.3 89.3  MCH 28.3 28.7 28.2  MCHC 31.3 31.8 31.5  RDW 14.0 13.9 14.2  PLT 136* 135* 152    Cardiac Enzymes Recent Labs Lab 05/10/17 0221 05/10/17 0718 05/10/17 1343  TROPONINI 0.03* 0.03* 0.04*    Recent Labs Lab 05/09/17 1239  TROPIPOC 0.01     BNP Recent Labs Lab 05/10/17 0718  BNP 590.2*     DDimer  Recent Labs Lab 05/09/17 1212  DDIMER 0.70*     Radiology    Ct Angio Chest Pe W And/or Wo Contrast  Result Date: 05/09/2017 CLINICAL DATA:  Shortness of Breath EXAM: CT ANGIOGRAPHY CHEST WITH CONTRAST TECHNIQUE: Multidetector CT imaging of the chest was performed using the standard protocol during bolus administration of intravenous contrast. Multiplanar CT image reconstructions and  MIPs were obtained to evaluate the vascular anatomy. CONTRAST:  80 mL Isovue 370 nonionic COMPARISON:  Chest radiograph May 09, 2017 FINDINGS: Cardiovascular: There is no demonstrable pulmonary embolus. The main pulmonary outflow tract measures 3.1 cm in diameter, a finding felt to be indicative of a degree of pulmonary arterial hypertension. There is no appreciable thoracic aortic aneurysm. While no dissection of the aorta is seen, the contrast bolus was not timed to allow for confident assessment of the thoracic aorta for potential dissection. There are foci of calcification in the proximal visualized great vessels. There are multiple foci of atherosclerotic calcification in the aorta. There are foci of calcification in several coronary arteries. The pericardium is not appreciably thickened. Mediastinum/Nodes: Thyroid appears unremarkable. There is no appreciable thoracic adenopathy. Scattered subcentimeter mediastinal lymph nodes are noted. There is a small hiatal hernia. Lungs/Pleura: There is scarring in the right apex. There are pleural effusions tracking along each major fissure. There is patchy bibasilar atelectatic change. There is mild interstitial edema in the lower lung zones. There is no frank airspace consolidation. Upper Abdomen: There is atherosclerotic calcification in the aorta. There is cholelithiasis. There is slight reflux of contrast into the inferior vena cava with trace contrast refluxed into the hepatic veins. Musculoskeletal: There is midthoracic dextroscoliosis. There is degenerative change in the thoracic spine. There are no blastic or lytic bone lesions. Review of the MIP images confirms the above findings. IMPRESSION: 1. No evident pulmonary embolus. Prominence of the main pulmonary outflow tract may indicate a degree of pulmonary arterial hypertension. 2. No thoracic aortic aneurysm. No dissection evident. Note that the contrast bolus is not timed to optimize assessment for potential  thoracic aortic dissection. There are foci of atherosclerotic calcification in the aorta and great vessels. There are foci of coronary artery calcification. 3. Findings felt to represent a degree of congestive heart failure. No airspace consolidation. 4.  No appreciable adenopathy. 5. Slight reflux of contrast into the inferior vena cava and hepatic veins may indicate a degree of increase in right heart pressure. 6.  Cholelithiasis. Aortic Atherosclerosis (ICD10-I70.0). Electronically Signed   By: Lowella Grip III M.D.   On: 05/09/2017 16:20    Cardiac Studies   2d echo 05/10/17 Study Conclusions - Left ventricle: The cavity size was normal. Wall thickness was   increased in a pattern of mild LVH. Systolic function was   moderately to severely  reduced. The estimated ejection fraction   was in the range of 30% to 35%. Dyskinesis of the anteroseptal   myocardium and akinesis of the inferior and inferoseptum. Doppler   parameters are consistent with high ventricular filling pressure. - Aortic valve: Transvalvular velocity was within the normal range.   There was no stenosis. There was trivial regurgitation. - Mitral valve: Mildly calcified annulus. Transvalvular velocity   was within the normal range. There was no evidence for stenosis.   There was mild regurgitation. - Left atrium: The atrium was moderately dilated. - Right ventricle: The cavity size was normal. Wall thickness was   normal. Systolic function was normal. - Pulmonary arteries: Systolic pressure was mildly increased. PA   peak pressure: 46 mm Hg (S).  Patient Profile     83 y.o. male with COPD, stroke 2012 (pt reports 2 prior strokes total), cardiomyopathy dx at time of stroke (has not had further w/u), known LBBB, asthma, 20-30 yrs prior tobacco use, prostate CA, DM admitted with 1 week hx of SOB, cough, dx with AECOPD and new onset atrial flutter RVR.   Assessment & Plan    1. Shortness of breath/wheezing/cough,  suspect multifactorial due to AECOPD, also compounded by newly recognized atrial flutter RVR and prior known cardiomyopathy - duration of atrial flutter unknown but has been feeling unwell for about a week. Not previously on anticoagulation - at some point in 2012 was on Plavix. Mild thrombocytopenia noted but patient denies prior h/o bleeding. He's currently on heparin. Given his AKI, would need to consider renally adjusted medication if changing to oral - will need to follow trajectory of Cr before selecting agent. Diltiazem is being used for rate control currently but he went into 2:1 conduction yesterday without significant improvement since. Longterm diltiazem is not the best agent given his cardiomyopathy but BB not yet initiated given his wheezing. No change to digoxin overnight (poor choice given renal issues). I will discuss further strategy with Dr. Debara Pickett. Daughter remains largely skeptical of any invasive procedures and continues to affirm her father is doing just fine.  2. Acute on chronic systolic CHF - family continues to report they feel he is at baseline. Unclear accuracy of weights 200-205-187. I/O's +546 with only 900 UOP yesterday. Received 40mg  oral Lasix yesterday by IM, 1 dose IV by cardiology, then today received 40mg  oral and 40mg  IV per primary team. Would hold further diuretic given acute rise in Cr. See above re: BB. Given AKI would ACEI/ARB/ARNI/spironolactone for now, could be considered in the future.  3. 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 Cr rising. Would hold further diuretics. Avoid nephrotoxic agents.  4. Marginally elevated troponin - suspected demand ischemia, but no prior ischemic eval. In talking briefly with family it does not sound like anyone in the room is excited about putting him through procedures. Would continue aspirin for now.  Signed, Charlie Pitter, PA-C  05/11/2017, 12:24 PM

## 2017-05-11 NOTE — Anesthesia Preprocedure Evaluation (Addendum)
Anesthesia Evaluation  Patient identified by MRN, date of birth, ID band Patient awake    Reviewed: Allergy & Precautions, H&P , NPO status , Patient's Chart, lab work & pertinent test results  Airway Mallampati: II   Neck ROM: Full    Dental  (+) Upper Dentures, Lower Dentures, Dental Advisory Given   Pulmonary asthma , COPD, former smoker,    breath sounds clear to auscultation       Cardiovascular +CHF  + dysrhythmias Atrial Fibrillation  Rhythm:irregular Rate:Normal     Neuro/Psych CVA    GI/Hepatic   Endo/Other  diabetes, Type 2  Renal/GU Renal InsufficiencyRenal disease     Musculoskeletal   Abdominal   Peds  Hematology   Anesthesia Other Findings   Reproductive/Obstetrics                           Anesthesia Physical Anesthesia Plan  ASA: III  Anesthesia Plan: General   Post-op Pain Management:    Induction:   PONV Risk Score and Plan: 2 and Ondansetron, Dexamethasone, Propofol and Treatment may vary due to age or medical condition  Airway Management Planned: Simple Face Mask  Additional Equipment:   Intra-op Plan:   Post-operative Plan:   Informed Consent: I have reviewed the patients History and Physical, chart, labs and discussed the procedure including the risks, benefits and alternatives for the proposed anesthesia with the patient or authorized representative who has indicated his/her understanding and acceptance.     Plan Discussed with: CRNA, Anesthesiologist and Surgeon  Anesthesia Plan Comments:        Anesthesia Quick Evaluation

## 2017-05-12 DIAGNOSIS — I420 Dilated cardiomyopathy: Secondary | ICD-10-CM

## 2017-05-12 DIAGNOSIS — I248 Other forms of acute ischemic heart disease: Secondary | ICD-10-CM

## 2017-05-12 LAB — GLUCOSE, CAPILLARY
Glucose-Capillary: 270 mg/dL — ABNORMAL HIGH (ref 65–99)
Glucose-Capillary: 219 mg/dL — ABNORMAL HIGH (ref 65–99)
Glucose-Capillary: 338 mg/dL — ABNORMAL HIGH (ref 65–99)
Glucose-Capillary: 310 mg/dL — ABNORMAL HIGH (ref 65–99)

## 2017-05-12 LAB — CBC
HCT: 34.7 % — ABNORMAL LOW (ref 39.0–52.0)
Hemoglobin: 11 g/dL — ABNORMAL LOW (ref 13.0–17.0)
MCH: 28.3 pg (ref 26.0–34.0)
MCHC: 31.7 g/dL (ref 30.0–36.0)
MCV: 89.2 fL (ref 78.0–100.0)
PLATELETS: 149 10*3/uL — AB (ref 150–400)
RBC: 3.89 MIL/uL — ABNORMAL LOW (ref 4.22–5.81)
RDW: 14.2 % (ref 11.5–15.5)
WBC: 9.2 10*3/uL (ref 4.0–10.5)

## 2017-05-12 LAB — HEPARIN LEVEL (UNFRACTIONATED)
Heparin Unfractionated: 0.23 IU/mL — ABNORMAL LOW (ref 0.30–0.70)
Heparin Unfractionated: <0.1 IU/mL — ABNORMAL LOW (ref 0.30–0.70)

## 2017-05-12 LAB — BASIC METABOLIC PANEL
Anion gap: 7 (ref 5–15)
BUN: 47 mg/dL — AB (ref 6–20)
CO2: 22 mmol/L (ref 22–32)
CREATININE: 1.94 mg/dL — AB (ref 0.61–1.24)
Calcium: 8.6 mg/dL — ABNORMAL LOW (ref 8.9–10.3)
Chloride: 106 mmol/L (ref 101–111)
GFR calc Af Amer: 34 mL/min — ABNORMAL LOW (ref >60)
GFR, EST NON AFRICAN AMERICAN: 30 mL/min — AB (ref >60)
GLUCOSE: 354 mg/dL — AB (ref 65–99)
Potassium: 5 mmol/L (ref 3.5–5.1)
SODIUM: 135 mmol/L (ref 135–145)

## 2017-05-12 MED ORDER — AMIODARONE HCL 200 MG PO TABS
400.0000 mg | ORAL_TABLET | Freq: Two times a day (BID) | ORAL | Status: DC
  Administered 2017-05-12 – 2017-05-15 (×7): 400 mg via ORAL
  Filled 2017-05-12 (×7): qty 2

## 2017-05-12 MED ORDER — INSULIN DETEMIR 100 UNIT/ML ~~LOC~~ SOLN
20.0000 [IU] | Freq: Two times a day (BID) | SUBCUTANEOUS | Status: DC
  Administered 2017-05-12 – 2017-05-15 (×6): 20 [IU] via SUBCUTANEOUS
  Filled 2017-05-12 (×7): qty 0.2

## 2017-05-12 MED ORDER — APIXABAN 2.5 MG PO TABS
2.5000 mg | ORAL_TABLET | Freq: Two times a day (BID) | ORAL | Status: DC
  Administered 2017-05-12 – 2017-05-13 (×3): 2.5 mg via ORAL
  Filled 2017-05-12 (×3): qty 1

## 2017-05-12 NOTE — Progress Notes (Signed)
Progress Note  Patient Name: Dustin Leonard Date of Encounter: 05/12/2017  Primary Cardiologist: Dr. Debara Pickett  Subjective   Denies any SOB or CP  Inpatient Medications    Scheduled Meds: . amiodarone  150 mg Intravenous Once  . aspirin  81 mg Oral Daily  . insulin aspart  0-9 Units Subcutaneous TID WC  . insulin detemir  15 Units Subcutaneous BID  . ipratropium  0.5 mg Nebulization TID  . levalbuterol  0.63 mg Nebulization TID  . levofloxacin  500 mg Oral Daily  . mometasone-formoterol  2 puff Inhalation BID  . predniSONE  40 mg Oral Q breakfast  . sodium chloride flush  3 mL Intravenous Q12H   Continuous Infusions: . amiodarone 30 mg/hr (05/12/17 0619)  . heparin 850 Units/hr (05/12/17 0528)   PRN Meds: levalbuterol   Vital Signs    Vitals:   05/12/17 0339 05/12/17 0404 05/12/17 0726 05/12/17 0730  BP: (!) 146/78   (!) 151/81  Pulse: 84   87  Resp: 19   (!) 23  Temp: 98.2 F (36.8 C)   98.4 F (36.9 C)  TempSrc: Oral   Oral  SpO2: 100%  100% 100%  Weight:  192 lb (87.1 kg)    Height:        Intake/Output Summary (Last 24 hours) at 05/12/17 0949 Last data filed at 05/12/17 0900  Gross per 24 hour  Intake          1077.91 ml  Output             2400 ml  Net         -1322.09 ml   Filed Weights   05/11/17 0301 05/11/17 1326 05/12/17 0404  Weight: 187 lb (84.8 kg) 187 lb (84.8 kg) 192 lb (87.1 kg)    Telemetry    NSR with frequent PACs - Personally Reviewed  ECG    No new EKG to review - Personally Reviewed  Physical Exam   GEN: No acute distress.   Neck: No JVD Cardiac: RRR, no murmurs, rubs, or gallops. Frequent ectopy Respiratory: Clear to auscultation bilaterally. GI: Soft, nontender, non-distended  MS: No edema; No deformity. Neuro:  Nonfocal  Psych: Normal affect   Labs    Chemistry Recent Labs Lab 05/09/17 1212 05/10/17 0221 05/11/17 0345 05/12/17 0248  NA 136 134* 134* 135  K 5.0 5.1 4.9 5.0  CL 107 106 106 106  CO2 22 18*  21* 22  GLUCOSE 268* 515* 432* 354*  BUN 22* 34* 47* 47*  CREATININE 1.58* 1.69* 2.27* 1.94*  CALCIUM 8.8* 8.5* 8.6* 8.6*  PROT 6.4*  --   --   --   ALBUMIN 3.3*  --   --   --   AST 14*  --   --   --   ALT 10*  --   --   --   ALKPHOS 77  --   --   --   BILITOT 0.9  --   --   --   GFRNONAA 38* 35* 24* 30*  GFRAA 44* 41* 28* 34*  ANIONGAP 7 10 7 7      Hematology Recent Labs Lab 05/10/17 0221 05/11/17 0345 05/12/17 0248  WBC 7.7 9.7 9.2  RBC 3.90* 3.73* 3.89*  HGB 11.2* 10.5* 11.0*  HCT 35.2* 33.3* 34.7*  MCV 90.3 89.3 89.2  MCH 28.7 28.2 28.3  MCHC 31.8 31.5 31.7  RDW 13.9 14.2 14.2  PLT 135* 152 149*    Cardiac  Enzymes Recent Labs Lab 05/10/17 0221 05/10/17 0718 05/10/17 1343  TROPONINI 0.03* 0.03* 0.04*    Recent Labs Lab 05/09/17 1239  TROPIPOC 0.01     BNP Recent Labs Lab 05/10/17 0718  BNP 590.2*     DDimer  Recent Labs Lab 05/09/17 1212  DDIMER 0.70*     Radiology    No results found.  Cardiac Studies   2D echo 04/2017 Study Conclusions  - Left ventricle: The cavity size was normal. Wall thickness was   increased in a pattern of mild LVH. Systolic function was   moderately to severely reduced. The estimated ejection fraction   was in the range of 30% to 35%. Dyskinesis of the anteroseptal   myocardium and akinesis of the inferior and inferoseptum. Doppler   parameters are consistent with high ventricular filling pressure. - Aortic valve: Transvalvular velocity was within the normal range.   There was no stenosis. There was trivial regurgitation. - Mitral valve: Mildly calcified annulus. Transvalvular velocity   was within the normal range. There was no evidence for stenosis.   There was mild regurgitation. - Left atrium: The atrium was moderately dilated. - Right ventricle: The cavity size was normal. Wall thickness was   normal. Systolic function was normal. - Pulmonary arteries: Systolic pressure was mildly increased. PA    peak pressure: 46 mm Hg (S).  Patient Profile     83 y.o. male  with COPD, stroke 2012 (pt reports 2 prior strokes total), cardiomyopathy dx at time of stroke (has not had further w/u), known LBBB, asthma, 20-30 yrs prior tobacco use, prostate CA, DM admitted with 1 week hx of SOB, cough, dx with AECOPD and new onset atrial flutter RVR.   Assessment & Plan    1. Shortness of breath/wheezing/cough, suspect multifactorial due to AECOPD, also compounded by newly recognized atrial flutter RVR and prior known cardiomyopathy - treatment per [facility]  2.  New onset atrial flutter - duration of atrial flutter unknown but has been feeling unwell for about a week. Not previously on anticoagulation - at some point in 2012 was on Plavix.  - s/p TEE/DCCV yesterday.  He is maintaining NSR with frequent PACs on IV Amio.  - will change Amio to 400mg  BID PO - Continue IV Heparin gtt - Given his AKI, would need to consider renally adjusted medication if changing to oral - will need to follow trajectory of Cr before selecting agent.   3. Acute on chronic systolic CHF  - Unclear accuracy of weights 200-205-187.  - I/O's 2.1L out  Yesterday and net positive 213cc. - creatinine down to 1.94 today after holding diuretics.   - continue to hold Lasix for now - Given AKI would ACEI/ARB/ARNI/spironolactone for now, could be considered in the future.  4. 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  - Cr improved after holding diuretics. - Would continue to hold further diuretics.  - Avoid nephrotoxic agents.  5. Marginally elevated troponin - suspected demand ischemia, but no prior ischemic eval - continue ASA - family has been resistent to further ischemic workup  6.  Mobile atheroma on aorta by TEE -   Signed, Fransico Him, MD  05/12/2017, 9:49 AM

## 2017-05-12 NOTE — Progress Notes (Signed)
Family Medicine Teaching Service Daily Progress Note Intern Pager: [phone]  Patient name: Dustin Leonard Medical record number: [id number] Date of birth: November 08, 1928 Age: 83 y.o. Gender: male  Primary Care Provider: Katherina Mires, MD  Consultants: Cardiology Code Status: FULL  Pt Overview and Major Events to Date:  Dustin Leonard is a 83 year old male who presented on 05/09/17 for a one week hx of SOB, wheezing, and nonproductive cough.  He has a PMH significant for glaucoma, COPD, prostate cancer, stroke, TIA, and T2DM.  For the past week, he has been using duonebs about 3 x per day and albuterol inhaler 2 x per day.  In the ED, he received solumedrol and duoneb and noted improvement.  He also was found to have atrial flutter in the ED, so he was given metoprolol then a diltiazem drip.  Assessment and Plan: Dustin Leonard is a 83 y.o. male presenting with SOB x 1 week . PMH is significant for glaucoma, COPD, prostate cancer, stroke, TIA, and T2DM.   SOB Improving.  Patient on RA with good O2 sats this morning of >95%.  With some bilateral expiratory wheezing on exam.  No edema present. Shortness of breath likely a combination of COPD exacerbation and fluid overload.  CXR with cardiac enlargement with bibasilar atelectasis and atherosclerotic aorta.  CT angio negative for PE.  Echo on 7/19 revealed EF of 30-35% (same as on previous echo) and severely reduced LV function. EKG on 7/19 showed new onset atrial flutter, variable AV block, T wave ischemia.   Digoxin was given once on 7/19 by cardiology.  40 mg IV Lasix given on 7/20 for volume overload.  Initially on dilt drip.  TEE and cardioversion by Cardiology on 7/20. Currently on Amiodarone drip.   -cardiology following, appreciate recs  -continuous pulse ox -re-assess volume status daily, will dose more lasix if needed -ipratropium q6h  -levalbuterol q6h/1h prn (avoiding albuterol due to patient's tachycardia)   -Continue levofloxacin 750 mg  (Day 4)  -dulera bid, initiating controller medication as patient had hospitalization for COPD exacerbation within the past year  -prednisone 40 mg daily  -daily weights -strict I's and O's  New onset Atrial flutter  Patient presented to ED with new onset atrial flutter. HR of 131. Was put on Cardizem drip in ED with resulting correction of heart rate. Tachycardia likely worsened in the setting of albuterol treatment. CHADSVASC score of 6. No history of bleeding.  TSH wnl at 0.437.  Troponins remained low.  S/p TEE and cardioversion on 7/20, pt now on Amiodarone drip.  Tachycardia has improved this morning to 80's.   -continue Amiodarone gtt  -heparin gtt per pharmacy  -cards recs as above   AKI: Cr 1.58 (bl 0.9-1.0). May be pre-renal as concern that patient is currently volume overloaded and continues to be overloaded during admission. Cr 2.27 on 7/20. Improved to 1.94 7/21.  -consider urine studies if no improvement  -holding Ramipril   COPD Patient has history of COPD. Was followed by Dr. Annamaria Boots, (Pulmonology), several years ago but has not had follow up recent due to no recent SOB. CXR showed cardiac enlargement with bibasilar atelectasis and atherosclerotic aorta. -breathing treatments as above   T2DM A1c on this admission 11.5.  Patient on home levemir 47 units prn if glucose over 190. Last hemoglobin A1c was 10 (Sept 2017).  Diabetes coordinator spoke with patient and his family on 7/19 and they wished to continue his current home regimen.  Glucose at 0345  on 7/20 was 432; multiple high readings recorded.  Received 50 units of Novolog over last 24 hours.  -sensitive sliding scale  -CBGs AC/HS - 432, 354  -Will increase Levemir to 20 U BID from 15 U  History of CVA: Reportedly without residual deficits.  -continue ASA 81 mg  -care everywhere PCP note states he is on Plavix and a statin, confirm with pharmacy in AM and restart as needed   FEN/GI: HH/carb modified diet   Prophylaxis: heparin    Disposition: Home  Subjective:  Dustin Leonard noted he was feeling well this morning.  Wife and daughter at bedside.  Denies CP, SOB, palpitations.  Per daughter, he had received a breathing treatment this morning and feels better after that. He is on RA.   No other concerns at this time.    Objective: Temp:  [97.5 F (36.4 C)-98.5 F (36.9 C)] 98.4 F (36.9 C) (07/21 0730) Pulse Rate:  [66-121] 87 (07/21 0730) Resp:  [17-28] 23 (07/21 0730) BP: (103-151)/(50-81) 151/81 (07/21 0730) SpO2:  [97 %-100 %] 100 % (07/21 0730) Weight:  [187 lb (84.8 kg)-192 lb (87.1 kg)] 192 lb (87.1 kg) (07/21 0404)   Physical Exam: General: pleasant 83 yo M, comfortable appearing, laying in hospital bed  Cardiovascular: RRR, no MRG  Respiratory: mild expiratory wheezes bilaterally, no crackles appreciated  Abdomen: soft, nontender, +bowel sounds Extremities: No edema present in bilateral LE, hands mildly swollen bilaterally, nonpitting  Neuro: AAOx3, good tone  Laboratory:  Recent Labs Lab 05/10/17 0221 05/11/17 0345 05/12/17 0248  WBC 7.7 9.7 9.2  HGB 11.2* 10.5* 11.0*  HCT 35.2* 33.3* 34.7*  PLT 135* 152 149*    Recent Labs Lab 05/09/17 1212 05/10/17 0221 05/11/17 0345 05/12/17 0248  NA 136 134* 134* 135  K 5.0 5.1 4.9 5.0  CL 107 106 106 106  CO2 22 18* 21* 22  BUN 22* 34* 47* 47*  CREATININE 1.58* 1.69* 2.27* 1.94*  CALCIUM 8.8* 8.5* 8.6* 8.6*  PROT 6.4*  --   --   --   BILITOT 0.9  --   --   --   ALKPHOS 77  --   --   --   ALT 10*  --   --   --   AST 14*  --   --   --   GLUCOSE 268* 515* 432* 354*   Imaging/Diagnostic Tests: ECHO on 05/10/17:  Study Conclusions  - Left ventricle: The cavity size was normal. Wall thickness was   increased in a pattern of mild LVH. Systolic function was   moderately to severely reduced. The estimated ejection fraction   was in the range of 30% to 35%. Dyskinesis of the anteroseptal   myocardium and akinesis  of the inferior and inferoseptum. Doppler   parameters are consistent with high ventricular filling pressure. - Aortic valve: Transvalvular velocity was within the normal range.   There was no stenosis. There was trivial regurgitation. - Mitral valve: Mildly calcified annulus. Transvalvular velocity   was within the normal range. There was no evidence for stenosis.   There was mild regurgitation. - Left atrium: The atrium was moderately dilated. - Right ventricle: The cavity size was normal. Wall thickness was   normal. Systolic function was normal. - Pulmonary arteries: Systolic pressure was mildly increased. PA   peak pressure: 46 mm Hg (S).  Lovenia Kim, MD 05/12/2017, 10:52 AM PGY-2, [place] Intern pager: [phone], text pages welcome

## 2017-05-12 NOTE — Progress Notes (Signed)
Pharmacist Heart Failure Core Measure Documentation  Assessment: Dustin Leonard has an EF documented as 30-35% on 05/10/17 by ECHO.  Rationale: Heart failure patients with left ventricular systolic dysfunction (LVSD) and an EF < 40% should be prescribed an angiotensin converting enzyme inhibitor (ACEI) or angiotensin receptor blocker (ARB) at discharge unless a contraindication is documented in the medical record.  This patient is not currently on an ACEI or ARB for HF.  This note is being placed in the record in order to provide documentation that a contraindication to the use of these agents is present for this encounter.  ACE Inhibitor or Angiotensin Receptor Blocker is contraindicated (specify all that apply)  []   ACEI allergy AND ARB allergy []   Angioedema []   Moderate or severe aortic stenosis []   Hyperkalemia []   Hypotension []   Renal artery stenosis [x]   Worsening renal function, preexisting renal disease or dysfunction   Uvaldo Rising, BCPS  Clinical Pharmacist Pager [phone]  05/12/2017 3:42 PM

## 2017-05-12 NOTE — Progress Notes (Signed)
ANTICOAGULATION CONSULT NOTE - Follow Up Consult  Pharmacy Consult for Heparin  Indication: atrial fibrillation  No Known Allergies  Patient Measurements: Height: 5\' 11"  (180.3 cm) (per son) Weight: 192 lb (87.1 kg) IBW/kg (Calculated) : 75.3  Vital Signs: Temp: 98.2 F (36.8 C) (07/21 0339) Temp Source: Oral (07/21 0339) BP: 146/78 (07/21 0339) Pulse Rate: 84 (07/21 0339)  Labs:  Recent Labs  05/09/17 1212 05/10/17 0221  05/10/17 0718 05/10/17 1343 05/11/17 0058 05/11/17 0345 05/12/17 0248  HGB 11.4* 11.2*  --   --   --   --  10.5* 11.0*  HCT 36.4* 35.2*  --   --   --   --  33.3* 34.7*  PLT 136* 135*  --   --   --   --  152 149*  HEPARINUNFRC  --   --   < >  --  1.06* 0.58 0.46 0.23*  CREATININE 1.58* 1.69*  --   --   --   --  2.27*  --   TROPONINI  --  0.03*  --  0.03* 0.04*  --   --   --   < > = values in this interval not displayed.  Estimated Creatinine Clearance: 24.9 mL/min (A) (by C-G formula based on SCr of 2.27 mg/dL (H)).   Assessment: 83 y/o M on heparin for aflutter, heparin level sub-therapeutic this AM, s/p cardioversion 7/20, no issues per RN.   Goal of Therapy:  Heparin level 0.3-0.7 units/ml Monitor platelets by anticoagulation protocol: Yes   Plan:  -Inc heparin to 850 units/hr -1300 HL  Narda Bonds 05/12/2017,4:17 AM

## 2017-05-13 ENCOUNTER — Encounter (HOSPITAL_COMMUNITY): Payer: Self-pay | Admitting: Cardiovascular Disease

## 2017-05-13 LAB — CBC
HEMATOCRIT: 34.3 % — AB (ref 39.0–52.0)
Hemoglobin: 11.2 g/dL — ABNORMAL LOW (ref 13.0–17.0)
MCH: 28.7 pg (ref 26.0–34.0)
MCHC: 32.7 g/dL (ref 30.0–36.0)
MCV: 87.9 fL (ref 78.0–100.0)
PLATELETS: 130 10*3/uL — AB (ref 150–400)
RBC: 3.9 MIL/uL — AB (ref 4.22–5.81)
RDW: 14 % (ref 11.5–15.5)
WBC: 8.5 10*3/uL (ref 4.0–10.5)

## 2017-05-13 LAB — GLUCOSE, CAPILLARY
GLUCOSE-CAPILLARY: 170 mg/dL — AB (ref 65–99)
GLUCOSE-CAPILLARY: 301 mg/dL — AB (ref 65–99)
Glucose-Capillary: 170 mg/dL — ABNORMAL HIGH (ref 65–99)
Glucose-Capillary: 394 mg/dL — ABNORMAL HIGH (ref 65–99)

## 2017-05-13 LAB — BASIC METABOLIC PANEL
ANION GAP: 8 (ref 5–15)
BUN: 41 mg/dL — ABNORMAL HIGH (ref 6–20)
CO2: 21 mmol/L — ABNORMAL LOW (ref 22–32)
Calcium: 8.7 mg/dL — ABNORMAL LOW (ref 8.9–10.3)
Chloride: 108 mmol/L (ref 101–111)
Creatinine, Ser: 1.53 mg/dL — ABNORMAL HIGH (ref 0.61–1.24)
GFR, EST AFRICAN AMERICAN: 46 mL/min — AB (ref >60)
GFR, EST NON AFRICAN AMERICAN: 39 mL/min — AB (ref >60)
GLUCOSE: 232 mg/dL — AB (ref 65–99)
POTASSIUM: 4.9 mmol/L (ref 3.5–5.1)
Sodium: 137 mmol/L (ref 135–145)

## 2017-05-13 NOTE — Progress Notes (Signed)
Family Medicine Teaching Service Daily Progress Note Intern Pager: [phone]  Patient name: Dustin Leonard Medical record number: [id number] Date of birth: 04-07-1929 Age: 83 y.o. Gender: male  Primary Care Provider: Katherina Mires, MD  Consultants: Cardiology Code Status: FULL   Pt Overview and Major Events to Date:  Dustin Leonard is a 83 year old male who presented on 05/09/17 for a one week hx of SOB, wheezing, and nonproductive cough.  He has a PMH significant for glaucoma, COPD, prostate cancer, stroke, TIA, and T2DM.    Assessment and Plan: Dustin Leonard is a 83 y.o. male presenting with SOB x 1 week . PMH is significant for glaucoma, COPD, prostate cancer, stroke, TIA, and T2DM.   SOB Improving.  Patient on RA with good O2 sats this morning of >95%.  Exam with mild wheezes bilaterally.  No edema or signs of fluid overload present and breathing is comfortable.  Shortness of breath was likely a combination of COPD exacerbation and fluid overload.  CXR with cardiac enlargement with bibasilar atelectasis and atherosclerotic aorta.  CT angio negative for PE.  Echo on 7/19 revealed EF of 30-35% (same as on previous echo) and severely reduced LV function. EKG on 7/19 showed new onset atrial flutter, variable AV block, T wave ischemia.   Digoxin was given once on 7/19 by cardiology.  40 mg IV Lasix given on 7/20 for volume overload.  Initially on dilt drip.  S/p TEE and cardioversion by Cardiology on 7/20. Currently on Amiodarone drip and cardiology following.    -cardiology following, appreciate recs  -re-assess volume status daily, will dose more lasix if needed -ipratropium q6h  -levalbuterol q6h/1h prn (avoiding albuterol due to patient's tachycardia)   -Continue levofloxacin 750 mg (Day 5)  -dulera bid, initiating controller medication as patient had hospitalization for COPD exacerbation within the past year  -prednisone 40 mg daily  -daily weights -strict I's and O's  New onset  Atrial flutter  Patient presented to ED with new onset atrial flutter. HR of 131. Was put on Cardizem drip in ED with resulting correction of heart rate. Tachycardia likely worsened in the setting of albuterol treatment. CHADSVASC score of 6. No history of bleeding.  TSH wnl at 0.437.  Troponins remained low.  S/p TEE and cardioversion on 7/20, pt now on Amiodarone drip.  Tachycardia has improved this morning to 80-90's.  Per cardiology, can transition to renally dosed oral anticoagulation.  Discussed with pharmacy 7/21 and started on Eliquis 2.5 mg BID.   -continue Amiodarone gtt   -cards recs as above   AKI: Improving.  Cr 1.58 on admission (bl 0.9-1.0). Cr increased to 2.27 on 7/20 however has improved to 1.53 7/22.  -holding Ramipril   COPD Patient has history of COPD. Was followed by Dr. Annamaria Boots, (Pulmonology), several years ago but has not had follow up recent due to no recent SOB. CXR showed cardiac enlargement with bibasilar atelectasis and atherosclerotic aorta. -breathing treatments as above   T2DM A1c on this admission 11.5.  Patient on home levemir 47 units prn if glucose over 190. Last hemoglobin A1c was 10 (Sept 2017).  -sensitive sliding scale  -CBGs AC/HS - AM CBG 170  -Will increase Levemir to 20 U BID from 15 U  History of CVA: Reportedly without residual deficits.  -continue ASA 81 mg  -care everywhere PCP note states he is on Plavix and a statin, confirm with pharmacy in AM and restart as needed   FEN/GI: HH/carb modified diet  Prophylaxis: heparin   Disposition: Home  Subjective:  Sitting in bed eating breakfast.  With no complaints this morning.  His  wife and daughter will be coming by after church this morning.  Denies CP, SOB, palpitations.    Objective: Temp:  [97.9 F (36.6 C)-98.5 F (36.9 C)] 97.9 F (36.6 C) (07/22 0700) Pulse Rate:  [87-102] 90 (07/22 0700) Resp:  [19-32] 19 (07/22 0700) BP: (118-165)/(62-117) 143/80 (07/22 0700) SpO2:  [97 %-100  %] 99 % (07/22 0732)   Physical Exam: General: pleasant 83 yo M, comfortable appearing, sitting up in hospital bed eating eggs  Cardiovascular: RRR, no MRG  Respiratory: mild expiratory wheezes bilaterally, no rhonchi rales or crackles noted  Abdomen: soft, nontender, +bowel sounds Extremities: No edema present in bilateral LE, hands mildly swollen bilaterally, nonpitting  Neuro: AAOx4   Laboratory:  Recent Labs Lab 05/11/17 0345 05/12/17 0248 05/13/17 0346  WBC 9.7 9.2 8.5  HGB 10.5* 11.0* 11.2*  HCT 33.3* 34.7* 34.3*  PLT 152 149* 130*    Recent Labs Lab 05/09/17 1212  05/11/17 0345 05/12/17 0248 05/13/17 0346  NA 136  < > 134* 135 137  K 5.0  < > 4.9 5.0 4.9  CL 107  < > 106 106 108  CO2 22  < > 21* 22 21*  BUN 22*  < > 47* 47* 41*  CREATININE 1.58*  < > 2.27* 1.94* 1.53*  CALCIUM 8.8*  < > 8.6* 8.6* 8.7*  PROT 6.4*  --   --   --   --   BILITOT 0.9  --   --   --   --   ALKPHOS 77  --   --   --   --   ALT 10*  --   --   --   --   AST 14*  --   --   --   --   GLUCOSE 268*  < > 432* 354* 232*  < > = values in this interval not displayed. Imaging/Diagnostic Tests: ECHO on 05/10/17:  Study Conclusions  - Left ventricle: The cavity size was normal. Wall thickness was   increased in a pattern of mild LVH. Systolic function was   moderately to severely reduced. The estimated ejection fraction   was in the range of 30% to 35%. Dyskinesis of the anteroseptal   myocardium and akinesis of the inferior and inferoseptum. Doppler   parameters are consistent with high ventricular filling pressure. - Aortic valve: Transvalvular velocity was within the normal range.   There was no stenosis. There was trivial regurgitation. - Mitral valve: Mildly calcified annulus. Transvalvular velocity   was within the normal range. There was no evidence for stenosis.   There was mild regurgitation. - Left atrium: The atrium was moderately dilated. - Right ventricle: The cavity size was  normal. Wall thickness was   normal. Systolic function was normal. - Pulmonary arteries: Systolic pressure was mildly increased. PA   peak pressure: 46 mm Hg (S).  Lovenia Kim, MD 05/13/2017, 9:20 AM PGY-2, [place] Intern pager: [phone], text pages welcome

## 2017-05-13 NOTE — Progress Notes (Signed)
Progress Note  Patient Name: Dustin Leonard Date of Encounter: 05/13/2017  Primary Cardiologist: [facility]   Subjective   83 yo with chronic systolic CHF, rapid atrial flutter Had successful TEE / Cardioversion  on July 20 LV function had improved by TEE    Inpatient Medications    Scheduled Meds: . amiodarone  400 mg Oral BID  . apixaban  2.5 mg Oral BID  . aspirin  81 mg Oral Daily  . insulin aspart  0-9 Units Subcutaneous TID WC  . insulin detemir  20 Units Subcutaneous BID  . ipratropium  0.5 mg Nebulization TID  . levalbuterol  0.63 mg Nebulization TID  . levofloxacin  500 mg Oral Daily  . mometasone-formoterol  2 puff Inhalation BID  . predniSONE  40 mg Oral Q breakfast  . sodium chloride flush  3 mL Intravenous Q12H   Continuous Infusions:  PRN Meds: levalbuterol   Vital Signs    Vitals:   05/13/17 0300 05/13/17 0400 05/13/17 0700 05/13/17 0732  BP: 118/62 127/64 (!) 143/80   Pulse: 91 87 90   Resp: 20  19   Temp: 98.5 F (36.9 C)  97.9 F (36.6 C)   TempSrc: Oral  Oral   SpO2: 99% 99% 99% 99%  Weight:      Height:        Intake/Output Summary (Last 24 hours) at 05/13/17 0916 Last data filed at 05/13/17 0900  Gross per 24 hour  Intake           1019.5 ml  Output             1975 ml  Net           -955.5 ml   Filed Weights   05/11/17 0301 05/11/17 1326 05/12/17 0404  Weight: 187 lb (84.8 kg) 187 lb (84.8 kg) 192 lb (87.1 kg)    Telemetry    NSR  - Personally Reviewed  ECG     NSR  - Personally Reviewed  Physical Exam   GEN: No acute distress.   Neck: No JVD Cardiac: RRR, soft systolic murmur  rubs, or gallops.  Respiratory: Clear to auscultation bilaterally. GI: Soft, nontender, non-distended  MS: No edema; No deformity. Neuro:  Nonfocal  Psych: Normal affect   Labs    Chemistry Recent Labs Lab 05/09/17 1212  05/11/17 0345 05/12/17 0248 05/13/17 0346  NA 136  < > 134* 135 137  K 5.0  < > 4.9 5.0 4.9  CL 107  < > 106 106  108  CO2 22  < > 21* 22 21*  GLUCOSE 268*  < > 432* 354* 232*  BUN 22*  < > 47* 47* 41*  CREATININE 1.58*  < > 2.27* 1.94* 1.53*  CALCIUM 8.8*  < > 8.6* 8.6* 8.7*  PROT 6.4*  --   --   --   --   ALBUMIN 3.3*  --   --   --   --   AST 14*  --   --   --   --   ALT 10*  --   --   --   --   ALKPHOS 77  --   --   --   --   BILITOT 0.9  --   --   --   --   GFRNONAA 38*  < > 24* 30* 39*  GFRAA 44*  < > 28* 34* 46*  ANIONGAP 7  < > 7 7 8   < > =  values in this interval not displayed.   Hematology Recent Labs Lab 05/11/17 0345 05/12/17 0248 05/13/17 0346  WBC 9.7 9.2 8.5  RBC 3.73* 3.89* 3.90*  HGB 10.5* 11.0* 11.2*  HCT 33.3* 34.7* 34.3*  MCV 89.3 89.2 87.9  MCH 28.2 28.3 28.7  MCHC 31.5 31.7 32.7  RDW 14.2 14.2 14.0  PLT 152 149* 130*    Cardiac Enzymes Recent Labs Lab 05/10/17 0221 05/10/17 0718 05/10/17 1343  TROPONINI 0.03* 0.03* 0.04*    Recent Labs Lab 05/09/17 1239  TROPIPOC 0.01     BNP Recent Labs Lab 05/10/17 0718  BNP 590.2*     DDimer  Recent Labs Lab 05/09/17 1212  DDIMER 0.70*     Radiology    No results found.  Cardiac Studies     Patient Profile     83 y.o. male with CHF and atrial flutter    Assessment & Plan    1.  Atrial flutter:  Has maintained NSR since cardioversion .  On PO amio  On IV heparin . Currently on Eliquis 2.5 BID - as his renal function improves, we may have to adjust the dose.       2.   Chronic systolic CHF:   LV function has improved with rate control and cardioversoin  3.   CKD:   Renal function is improving .  Creatinine is 1.53 today     Signed, Mertie Moores, MD  05/13/2017, 9:16 AM

## 2017-05-14 ENCOUNTER — Encounter (HOSPITAL_COMMUNITY): Payer: Self-pay | Admitting: Cardiovascular Disease

## 2017-05-14 DIAGNOSIS — E1129 Type 2 diabetes mellitus with other diabetic kidney complication: Secondary | ICD-10-CM

## 2017-05-14 DIAGNOSIS — E1165 Type 2 diabetes mellitus with hyperglycemia: Secondary | ICD-10-CM

## 2017-05-14 LAB — GLUCOSE, CAPILLARY
Glucose-Capillary: 196 mg/dL — ABNORMAL HIGH (ref 65–99)
Glucose-Capillary: 174 mg/dL — ABNORMAL HIGH (ref 65–99)
Glucose-Capillary: 352 mg/dL — ABNORMAL HIGH (ref 65–99)
Glucose-Capillary: 279 mg/dL — ABNORMAL HIGH (ref 65–99)

## 2017-05-14 LAB — BASIC METABOLIC PANEL
ANION GAP: 7 (ref 5–15)
ANION GAP: 6 (ref 5–15)
BUN: 36 mg/dL — ABNORMAL HIGH (ref 6–20)
BUN: 36 mg/dL — AB (ref 6–20)
CALCIUM: 8.8 mg/dL — AB (ref 8.9–10.3)
CALCIUM: 8.9 mg/dL (ref 8.9–10.3)
CO2: 24 mmol/L (ref 22–32)
CO2: 24 mmol/L (ref 22–32)
CREATININE: 1.46 mg/dL — AB (ref 0.61–1.24)
CREATININE: 1.57 mg/dL — AB (ref 0.61–1.24)
Chloride: 106 mmol/L (ref 101–111)
Chloride: 103 mmol/L (ref 101–111)
GFR calc Af Amer: 44 mL/min — ABNORMAL LOW (ref >60)
GFR, EST AFRICAN AMERICAN: 48 mL/min — AB (ref >60)
GFR, EST NON AFRICAN AMERICAN: 42 mL/min — AB (ref >60)
GFR, EST NON AFRICAN AMERICAN: 38 mL/min — AB (ref >60)
GLUCOSE: 283 mg/dL — AB (ref 65–99)
GLUCOSE: 320 mg/dL — AB (ref 65–99)
Potassium: 5.7 mmol/L — ABNORMAL HIGH (ref 3.5–5.1)
Potassium: 6.3 mmol/L (ref 3.5–5.1)
Sodium: 137 mmol/L (ref 135–145)
Sodium: 133 mmol/L — ABNORMAL LOW (ref 135–145)

## 2017-05-14 LAB — CBC
HCT: 36.7 % — ABNORMAL LOW (ref 39.0–52.0)
Hemoglobin: 11.7 g/dL — ABNORMAL LOW (ref 13.0–17.0)
MCH: 28.3 pg (ref 26.0–34.0)
MCHC: 31.9 g/dL (ref 30.0–36.0)
MCV: 88.6 fL (ref 78.0–100.0)
PLATELETS: 141 10*3/uL — AB (ref 150–400)
RBC: 4.14 MIL/uL — ABNORMAL LOW (ref 4.22–5.81)
RDW: 14.1 % (ref 11.5–15.5)
WBC: 7.6 10*3/uL (ref 4.0–10.5)

## 2017-05-14 MED ORDER — RAMIPRIL 2.5 MG PO CAPS: 2.5000 mg | ORAL_CAPSULE | Freq: Every day | ORAL | Status: DC

## 2017-05-14 MED ORDER — APIXABAN 5 MG PO TABS
5.0000 mg | ORAL_TABLET | Freq: Two times a day (BID) | ORAL | Status: DC
  Administered 2017-05-14: 5 mg via ORAL
  Filled 2017-05-14: qty 1

## 2017-05-14 MED ORDER — SODIUM POLYSTYRENE SULFONATE 15 GM/60ML PO SUSP
30.0000 g | Freq: Once | ORAL | Status: AC
  Administered 2017-05-14: 30 g via ORAL
  Filled 2017-05-14: qty 120

## 2017-05-14 MED ORDER — METOPROLOL SUCCINATE ER 25 MG PO TB24
25.0000 mg | ORAL_TABLET | Freq: Every day | ORAL | Status: DC
  Administered 2017-05-14 – 2017-05-15 (×2): 25 mg via ORAL
  Filled 2017-05-14 (×2): qty 1

## 2017-05-14 MED ORDER — RAMIPRIL 2.5 MG PO CAPS: 10.0000 mg | ORAL_CAPSULE | Freq: Every day | ORAL | Status: DC

## 2017-05-14 MED ORDER — APIXABAN 2.5 MG PO TABS
2.5000 mg | ORAL_TABLET | Freq: Two times a day (BID) | ORAL | Status: DC
  Administered 2017-05-14: 2.5 mg via ORAL
  Filled 2017-05-14: qty 1

## 2017-05-14 NOTE — Progress Notes (Signed)
[facility] for apixiban Indication: afib  No Known Allergies  Labs:  Recent Labs  05/12/17 0248 05/12/17 1713 05/13/17 0346 05/14/17 0246 05/14/17 1446  HGB 11.0*  --  11.2* 11.7*  --   HCT 34.7*  --  34.3* 36.7*  --   PLT 149*  --  130* 141*  --   HEPARINUNFRC 0.23* <0.10*  --   --   --   CREATININE 1.94*  --  1.53* 1.46* 1.57*    Estimated Creatinine Clearance: 36 mL/min (A) (by C-G formula based on SCr of 1.57 mg/dL (H)).   Assessment: 83 yo male with aflutter and now in NSR s/p DCCV on 7/20.  Apixiban 2.5mg  po bid was started 7/21 and he is now on 5mg  po bid.Pharmacy consulted to review the dosing Scr trending up tonight.  Goal of Therapy:  Monitor platelets by anticoagulation protocol: Yes   Plan:  -Reduce dose to 2.5 mg BID for now. -Will provide patient education  Marguerite Olea  Clinical Pharmacist Pager [phone]  05/14/2017 8:53 PM

## 2017-05-14 NOTE — Progress Notes (Signed)
Inpatient Diabetes Program Recommendations  AACE/ADA: New Consensus Statement on Inpatient Glycemic Control (2015)  Target Ranges:  Prepandial:   less than 140 mg/dL      Peak postprandial:   less than 180 mg/dL (1-2 hours)      Critically ill patients:  140 - 180 mg/dL   Results for Dustin Leonard, Dustin Leonard (MRN [id number]) as of 05/14/2017 08:56  Ref. Range 05/13/2017 07:29 05/13/2017 12:03 05/13/2017 16:21 05/13/2017 21:16 05/14/2017 07:48  Glucose-Capillary Latest Ref Range: 65 - 99 mg/dL 170 (H) 170 (H) 301 (H) 394 (H) 196 (H)   Review of Glycemic Control  Current orders for Inpatient glycemic control: Levemir 20 units BID, Novolog 0-9 units TID with meals  Inpatient Diabetes Program Recommendations: Basal: Ordered Levemir 20 units BID. Anticipate basal insulin dose appropriate as fasting glucose 170 mg/dl on 7/22 and 196 mg/dl today. Post prandial glucose is consistently elevated and patient would benefit from meal coverage insulin. Do no recommend any changes with Levemir dose. Correction (SSI): Bedtime glucose 394 mg/dl on 05/13/17 and no Novolog correction given since none ordered. Please consider ordering Novolog 0-5 units QHS for bedtime correction. Insulin - Meal Coverage: While inpatient and ordered steroids and if appropriate, please consider ordering Novolog 4 units TID with meals for meal coverage if patient eats at least 50% of meals.  Thanks, Barnie Alderman, RN, MSN, CDE Diabetes Coordinator Inpatient Diabetes Program [phone] (Team Pager from 8am to 5pm)

## 2017-05-14 NOTE — Progress Notes (Signed)
Family Medicine Teaching Service Daily Progress Note Intern Pager: [phone]  Patient name: Dustin Leonard Medical record number: [id number] Date of birth: 1929/03/27 Age: 83 y.o. Gender: male  Primary Care Provider: Katherina Mires, MD  Consultants: Cardiology Code Status: FULL   Pt Overview and Major Events to Date:  Dustin Leonard is a 83 year old male who presented on 05/09/17 for a one week hx of SOB, wheezing, and nonproductive cough.  He has a PMH significant for glaucoma, COPD, prostate cancer, stroke, TIA, and T2DM.    Assessment and Plan: Wylie Coon is a 83 y.o. male presenting with SOB x 1 week . PMH is significant for glaucoma, COPD, prostate cancer, stroke, TIA, and T2DM.   SOB d/t COPD exacerbation Improving.  Patient on RA with good O2 sats this morning of >95%.  Exam with slight wheezes bilaterally.  No edema or signs of fluid overload present and breathing is comfortable.  Shortness of breath was likely a combination of COPD exacerbation and fluid overload.  CXR with cardiac enlargement with bibasilar atelectasis and atherosclerotic aorta.  CT angio negative for PE. -ipratropium q6h  -levalbuterol q6h/1h prn (avoiding albuterol due to patient's tachycardia)   -Continue levofloxacin 750 mg (Day 5)  -dulera bid, initiating controller medication as patient had hospitalization for COPD exacerbation within the past year  -D/C prednisone 40 mg  SOB d/t CHF exacerbation Echo on 7/19 revealed EF of 30-35% (same as on previous echo) and severely reduced LV function. EKG on 7/19 showed new onset atrial flutter, variable AV block, T wave ischemia.   Digoxin was given once on 7/19 by cardiology.  40 mg IV Lasix given on 7/20 for volume overload.  Initially on dilt drip.  S/p TEE and cardioversion by Cardiology on 7/20. Transitioned from amiodarone drip to PO amiodarone on 7/22.    -cardiology following, appreciate recs  -re-assess volume status daily, will dose more lasix if  needed -start 25 mg metoprolol succinate daily -daily weights -strict I's and O's -PT/OT to assess  New onset Atrial flutter  Patient presented to ED with new onset atrial flutter. HR of 131. Was put on Cardizem drip in ED with resulting correction of heart rate. Tachycardia likely worsened in the setting of albuterol treatment. CHADSVASC score of 6. No history of bleeding.  TSH wnl at 0.437.  Troponins remained low.  S/p TEE and cardioversion on 7/20, pt now on PO Amiodarone.  Tachycardia has improved this morning to 80-90's.  Per cardiology, can transition to renally dosed oral anticoagulation.  Discussed with pharmacy 7/21 and started on Eliquis 2.5 mg BID.   -continue PO Amiodarone 400 mg daily for two days, then change to 200 mg daily  -cards recs as above  -Eliquis increased to 5 mg BID, approved by pharmacy  AKI: Improving.  Cr 1.58 on admission (bl 0.9-1.0). Cr increased to 2.27 on 7/20 however has improved to 1.46 7/23.  -holding Ramipril due to hyperkalemia  COPD Patient has history of COPD. Was followed by Dr. Annamaria Boots, (Pulmonology), several years ago but has not had follow up recent due to no recent SOB. CXR showed cardiac enlargement with bibasilar atelectasis and atherosclerotic aorta. -breathing treatments as above   T2DM A1c on this admission 11.5.  Patient on home levemir 47 units prn if glucose over 190. Last hemoglobin A1c was 10 (Sept 2017).  -sensitive sliding scale  -CBGs AC/HS - AM CBG 196  -CBGs remain high (almost 400 7/22 pm), likely due to prednisone -continue Levemir  20 U BID -consider change to Lantus since this is a daily injection rather than BID; patient's goal is to avoid frequent needle sticks   History of CVA: Reportedly without residual deficits.  -continue ASA 81 mg  -care everywhere PCP note states he is on Plavix and a statin, however home pharmacy says he is not currently prescribed these medications.  This is an issue for follow-up on D/C  summary  Hyperkalemia: K+ 5.7 on 7/23 - am EKG on 7/23 negative for peaked T waves - repeat BMP at 1400 7/23 - hold ramipril  FEN/GI: HH/carb modified diet  Prophylaxis: heparin   Disposition: Home  Subjective:  Sitting comfortably in bed with no complaints.  Says his shortness of breath continues to be improved and denies chest pain.  He is hungry and wants to go home soon.  He and his wife denied any questions when I asked but nurse reported later that his wife had many questions about why he is still in the hospital.  Objective: Temp:  [97.8 F (36.6 C)-98.9 F (37.2 C)] 98.5 F (36.9 C) (07/23 0403) Pulse Rate:  [81-93] 81 (07/23 0403) Resp:  [19-26] 22 (07/23 0403) BP: (133-164)/(53-82) 149/73 (07/23 0403) SpO2:  [99 %-100 %] 100 % (07/23 0403)   Physical Exam: General: pleasant 83 yo M, comfortable appearing, sitting up in hospital bed breathing comfortably on room air Cardiovascular: RRR, no MRG  Respiratory: slight expiratory wheezes bilaterally, no rhonchi rales or crackles noted  Abdomen: firm, nontender, +bowel sounds Extremities: No edema present in bilateral LE, hands mildly swollen bilaterally, nonpitting  Neuro: AAOx4   Laboratory:  Recent Labs Lab 05/12/17 0248 05/13/17 0346 05/14/17 0246  WBC 9.2 8.5 7.6  HGB 11.0* 11.2* 11.7*  HCT 34.7* 34.3* 36.7*  PLT 149* 130* 141*    Recent Labs Lab 05/09/17 1212  05/12/17 0248 05/13/17 0346 05/14/17 0246  NA 136  < > 135 137 137  K 5.0  < > 5.0 4.9 5.7*  CL 107  < > 106 108 106  CO2 22  < > 22 21* 24  BUN 22*  < > 47* 41* 36*  CREATININE 1.58*  < > 1.94* 1.53* 1.46*  CALCIUM 8.8*  < > 8.6* 8.7* 8.8*  PROT 6.4*  --   --   --   --   BILITOT 0.9  --   --   --   --   ALKPHOS 77  --   --   --   --   ALT 10*  --   --   --   --   AST 14*  --   --   --   --   GLUCOSE 268*  < > 354* 232* 283*  < > = values in this interval not displayed. Imaging/Diagnostic Tests: ECHO on 05/10/17:  Study  Conclusions  - Left ventricle: The cavity size was normal. Wall thickness was   increased in a pattern of mild LVH. Systolic function was   moderately to severely reduced. The estimated ejection fraction   was in the range of 30% to 35%. Dyskinesis of the anteroseptal   myocardium and akinesis of the inferior and inferoseptum. Doppler   parameters are consistent with high ventricular filling pressure. - Aortic valve: Transvalvular velocity was within the normal range.   There was no stenosis. There was trivial regurgitation. - Mitral valve: Mildly calcified annulus. Transvalvular velocity   was within the normal range. There was no evidence for stenosis.  There was mild regurgitation. - Left atrium: The atrium was moderately dilated. - Right ventricle: The cavity size was normal. Wall thickness was   normal. Systolic function was normal. - Pulmonary arteries: Systolic pressure was mildly increased. PA   peak pressure: 46 mm Hg (S).  Kathrene Alu, MD 05/14/2017, 6:55 AM PGY-1, [place] Intern pager: [phone], text pages welcome

## 2017-05-14 NOTE — Progress Notes (Signed)
Physical Therapy Treatment Patient Details Name: Dustin Leonard MRN: [id number] DOB: 1929-02-04 Today's Date: 05/14/2017    History of Present Illness Dustin Leonard is a 83 year old male who presented on 05/09/17 for a one week hx of SOB.  He has a PMH significant for glaucoma, COPD, prostate cancer, stroke, TIA, and T2DM.  He also was found to have atrial flutter in the ED, so he was given metoprolol then a diltiazem drip.    PT Comments    Pt is pleasant and agreeable to ambulate with PT. Pt demonstrates some impulsive behavior with transfers and requires max VCs for sequencing gait with RW as well as navigating stairs safely. Pt was unstable during gait at times and had several LOB. 24 hour supervision at home will continue to be recommended d/t pt's impaired balance and decreased safety awareness. Pt is progressing with mobilization, as able to walk further distance and navigate stairs today. Will continue to follow for mobilization, improved balance and strengthening for safe d/c.   Vitals: HR 96  SpO2 100% on RA RR ranging from 28-31 during ambulation with RW    Follow Up Recommendations  Home health PT;Supervision/Assistance - 24 hour     Equipment Recommendations  None recommended by PT    Recommendations for Other Services       Precautions / Restrictions Precautions Precautions: Fall Precaution Comments: Pt with instability during ambulation and decreased safety awareness  Restrictions Weight Bearing Restrictions: No    Mobility  Bed Mobility Overal bed mobility: Needs Assistance Bed Mobility: Supine to Sit     Supine to sit: Min assist     General bed mobility comments: Min A for rise and VCs for sequencing and hand placement. Pt required use of bed rails to help pull up   Transfers Overall transfer level: Needs assistance     Sit to Stand: Min guard         General transfer comment: Min guard for safety and VCs for sequencing and hand placement    Ambulation/Gait Ambulation/Gait assistance: Min assist Ambulation Distance (Feet): 300 Feet Assistive device: Rolling walker (2 wheeled) Gait Pattern/deviations: Decreased stride length;Step-through pattern;Trunk flexed;Wide base of support;Drifts right/left Gait velocity: slowed Gait velocity interpretation: Below normal speed for age/gender General Gait Details: Min A as pt had several mild posterior LOB during ambulation. Needed cues to stay close to RW.    Stairs Stairs: Yes   Stair Management: One rail Left Number of Stairs: 4 General stair comments: Pt required min A d/t instability and required max VCs for sequencing and hand and foot placement. Pt struggled with foot placement and had difficulty d/t impaired vision.   Wheelchair Mobility    Modified Rankin (Stroke Patients Only)       Balance Overall balance assessment: Needs assistance Sitting-balance support: Feet unsupported;No upper extremity supported Sitting balance-Leahy Scale: Fair     Standing balance support: Bilateral upper extremity supported;During functional activity Standing balance-Leahy Scale: Poor Standing balance comment: relies on RW support for balance during ambulation                             Cognition Arousal/Alertness: Awake/alert Behavior During Therapy: Flat affect Overall Cognitive Status: Within Functional Limits for tasks assessed  Exercises      General Comments        Pertinent Vitals/Pain Pain Assessment: No/denies pain    Home Living                      Prior Function            PT Goals (current goals can now be found in the care plan section) Acute Rehab PT Goals Patient Stated Goal: to go home PT Goal Formulation: With patient/family Time For Goal Achievement: 05/28/17 Potential to Achieve Goals: Good Progress towards PT goals: Progressing toward goals    Frequency    Min  3X/week      PT Plan Current plan remains appropriate    Co-evaluation              AM-PAC PT "6 Clicks" Daily Activity  Outcome Measure  Difficulty turning over in bed (including adjusting bedclothes, sheets and blankets)?: Total Difficulty moving from lying on back to sitting on the side of the bed? : Total Difficulty sitting down on and standing up from a chair with arms (e.g., wheelchair, bedside commode, etc,.)?: A Little Help needed moving to and from a bed to chair (including a wheelchair)?: A Little Help needed walking in hospital room?: A Little Help needed climbing 3-5 steps with a railing? : A Little 6 Click Score: 14    End of Session Equipment Utilized During Treatment: Gait belt Activity Tolerance: Patient tolerated treatment well Patient left: in chair;with chair alarm set;with call bell/phone within reach;with family/visitor present   PT Visit Diagnosis: Unsteadiness on feet (R26.81);Muscle weakness (generalized) (M62.81);Other abnormalities of gait and mobility (R26.89);Pain;Difficulty in walking, not elsewhere classified (R26.2)     Time: 1761-6073 PT Time Calculation (min) (ACUTE ONLY): 28 min  Charges:  $Gait Training: 8-22 mins $Therapeutic Exercise: 8-22 mins                    G Codes:       Elberta Leatherwood, [place] [facility] [place] 05/14/2017, 12:17 PM

## 2017-05-14 NOTE — Progress Notes (Signed)
Progress Note  Patient Name: Dustin Leonard Date of Encounter: 05/14/2017  Primary Cardiologist: [facility]    Subjective   Breathing is OK  No CP    Inpatient Medications    Scheduled Meds: . amiodarone  400 mg Oral BID  . apixaban  2.5 mg Oral BID  . aspirin  81 mg Oral Daily  . insulin aspart  0-9 Units Subcutaneous TID WC  . insulin detemir  20 Units Subcutaneous BID  . ipratropium  0.5 mg Nebulization TID  . levalbuterol  0.63 mg Nebulization TID  . levofloxacin  500 mg Oral Daily  . mometasone-formoterol  2 puff Inhalation BID  . sodium chloride flush  3 mL Intravenous Q12H   Continuous Infusions:  PRN Meds: levalbuterol   Vital Signs    Vitals:   05/14/17 0100 05/14/17 0403 05/14/17 0700 05/14/17 0800  BP: (!) 157/82 (!) 149/73 (!) 150/76 (!) 154/80  Pulse: 90 81 77 76  Resp:  (!) 22 14 20   Temp:  98.5 F (36.9 C) 98.3 F (36.8 C)   TempSrc:  Oral Oral   SpO2: 100% 100% 100% 100%  Weight:      Height:        Intake/Output Summary (Last 24 hours) at 05/14/17 0958 Last data filed at 05/14/17 0752  Gross per 24 hour  Intake              600 ml  Output             1900 ml  Net            -1300 ml   Filed Weights   05/11/17 0301 05/11/17 1326 05/12/17 0404  Weight: 187 lb (84.8 kg) 187 lb (84.8 kg) 192 lb (87.1 kg)    Telemetry    SR   - Personally Reviewed  ECG    Physical Exam   GEN: No acute distress.   Neck: No JVD Cardiac: RRR, no murmurs, rubs, or gallops.  Respiratory: Clear to auscultation bilaterally. GI: Soft, nontender, non-distended  MS: No edema; No deformity. Neuro:  Nonfocal  Psych: Normal affect   Labs    Chemistry Recent Labs Lab 05/09/17 1212  05/12/17 0248 05/13/17 0346 05/14/17 0246  NA 136  < > 135 137 137  K 5.0  < > 5.0 4.9 5.7*  CL 107  < > 106 108 106  CO2 22  < > 22 21* 24  GLUCOSE 268*  < > 354* 232* 283*  BUN 22*  < > 47* 41* 36*  CREATININE 1.58*  < > 1.94* 1.53* 1.46*  CALCIUM 8.8*  < > 8.6* 8.7*  8.8*  PROT 6.4*  --   --   --   --   ALBUMIN 3.3*  --   --   --   --   AST 14*  --   --   --   --   ALT 10*  --   --   --   --   ALKPHOS 77  --   --   --   --   BILITOT 0.9  --   --   --   --   GFRNONAA 38*  < > 30* 39* 42*  GFRAA 44*  < > 34* 46* 48*  ANIONGAP 7  < > 7 8 7   < > = values in this interval not displayed.   Hematology Recent Labs Lab 05/12/17 0248 05/13/17 0346 05/14/17 0246  WBC 9.2 8.5 7.6  RBC 3.89* 3.90* 4.14*  HGB 11.0* 11.2* 11.7*  HCT 34.7* 34.3* 36.7*  MCV 89.2 87.9 88.6  MCH 28.3 28.7 28.3  MCHC 31.7 32.7 31.9  RDW 14.2 14.0 14.1  PLT 149* 130* 141*    Cardiac Enzymes Recent Labs Lab 05/10/17 0221 05/10/17 0718 05/10/17 1343  TROPONINI 0.03* 0.03* 0.04*    Recent Labs Lab 05/09/17 1239  TROPIPOC 0.01     BNP Recent Labs Lab 05/10/17 0718  BNP 590.2*     DDimer  Recent Labs Lab 05/09/17 1212  DDIMER 0.70*     Radiology    No results found.  Cardiac Studies     Patient Profile     83 y.o. male with CHF and atrial flutter  S/p cardioversion  ON amiodarone   Assessment & Plan    1  Atrial flutter  Remains in SR  On amiodarone  And Eliquis Keep on 400 bid for a couple days the switch to 200 bid He is on Eliquis  2.5 bid  Cr has improved  Since just had cardioversion would increase to 5 bid today  WIll review with pharmacy  (Cr clearance 46 which would put him on lowewrdose of Xarelto). 2  Chronic systolic CHF  Volume appears ok now that he is in SR    3  CKD  Follow    Signed, Dorris Carnes, MD  05/14/2017, 9:58 AM

## 2017-05-14 NOTE — Progress Notes (Signed)
CRITICAL VALUE ALERT  Critical Value:  Potassium 6.3    Date & Time Notied:  05/14/17 1545  Provider Notified: Shan Levans, [place]  Orders Received/Actions taken: [place].

## 2017-05-14 NOTE — Evaluation (Signed)
Occupational Therapy Re Evaluation Patient Details Name: Dustin Leonard MRN: [id number] DOB: 28-Feb-1929 Today's Date: 05/14/2017    History of Present Illness Dustin Leonard is a 83 year old male who presented on 05/09/17 for a one week hx of SOB.  He has a PMH significant for glaucoma, COPD, prostate cancer, stroke, TIA, and T2DM.  He also was found to have atrial flutter in the ED, so he was given metoprolol then a diltiazem drip.   Clinical Impression   Pt seen with wife in room. Family has been completely bathing,dressing and feeding pt since hospitalization. Pt does not protest. Educated pt and wife in importance of pt continuing to participate in ADL so burden of care at home will also be reduced. Pt is typically supervised for showering, but can dress, toilet and feed himself. He walks with or without a walker in his home. Will follow acutely, but questionable if OT will make an impact given his family's preference to assist pt.     Follow Up Recommendations  No OT follow up    Equipment Recommendations  None recommended by OT    Recommendations for Other Services       Precautions / Restrictions Precautions Precautions: Fall Restrictions Weight Bearing Restrictions: No      Mobility Bed Mobility Overal bed mobility: Needs Assistance Bed Mobility: Sit to Supine       Sit to supine: Min guard   General bed mobility comments: no assist to return to bed  Transfers Overall transfer level: Needs assistance Equipment used: Rolling walker (2 wheeled) Transfers: Sit to/from Stand Sit to Stand: Min guard         General transfer comment: Min guard for safety and VCs for sequencing and hand placement     Balance Overall balance assessment: Needs assistance Sitting-balance support: Feet unsupported;No upper extremity supported Sitting balance-Leahy Scale: Fair     Standing balance support: Bilateral upper extremity supported;During functional activity Standing  balance-Leahy Scale: Poor Standing balance comment: requires at least one hand support in standing                           ADL either performed or assessed with clinical judgement   ADL Overall ADL's : Needs assistance/impaired Eating/Feeding: Sitting;Moderate assistance Eating/Feeding Details (indicate cue type and reason): family has been feeding him due to IVs in hands Grooming: Wash/dry hands;Wash/dry face;Sitting;Set up;Supervision/safety   Upper Body Bathing: Moderate assistance;Sitting   Lower Body Bathing: Maximal assistance;Sit to/from stand   Upper Body Dressing : Minimal assistance;Sitting   Lower Body Dressing: Maximal assistance;Sit to/from stand   Toilet Transfer: Minimal assistance;Stand-pivot;RW   Toileting- Clothing Manipulation and Hygiene: Maximal assistance;Sit to/from stand       Functional mobility during ADLs: Minimal assistance;Rolling walker General ADL Comments: Per wife, pt's daughter has been bathing and dressing him here, wife feeds him.     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) Patient Visual Report: No change from baseline       Perception     Praxis      Pertinent Vitals/Pain Pain Assessment: No/denies pain     Hand Dominance Right   Extremity/Trunk Assessment Upper Extremity Assessment Upper Extremity Assessment: Generalized weakness   Lower Extremity Assessment Lower Extremity Assessment: Defer to PT evaluation   Cervical / Trunk Assessment Cervical / Trunk Assessment: Normal   Communication Communication Communication: HOH   Cognition Arousal/Alertness: Awake/alert Behavior During Therapy: Flat  affect Overall Cognitive Status: Impaired/Different from baseline Area of Impairment: Safety/judgement                         Safety/Judgement: Decreased awareness of safety         General Comments       Exercises     Shoulder Instructions      Home  Living Family/patient expects to be discharged to:: Private residence Living Arrangements: Spouse/significant other Available Help at Discharge: Family;Available 24 hours/day (wife and daughter) Type of Home: House Home Access: Stairs to enter [employer] of Steps: 4 Entrance Stairs-Rails: Right;Left;Can reach both Home Layout: Two level;Able to live on main level with bedroom/bathroom;Laundry or work area in basement     [employer] Shower/Tub: Teacher, early years/pre: Handicapped [place]: Environmental consultant - 2 wheels;Grab bars - tub/shower;Shower seat;Grab bars - toilet   Additional Comments: 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      Prior Functioning/Environment Level of Independence: Needs assistance  Gait / Transfers Assistance Needed: RW for mobility PTA ADL's / Homemaking Assistance Needed: supervision for showering, dresses, grooms and self feeds independently, wife or daughter perform IADL:   Comments: Uses RW as needed per daughter so there are times he uses nothing        OT Problem List: Impaired balance (sitting and/or standing);Impaired vision/perception;Decreased activity tolerance;Decreased knowledge of use of DME or AE;Decreased safety awareness;Impaired UE functional use      OT Treatment/Interventions: Self-care/ADL training;DME and/or AE instruction;Patient/family education;Balance training;Therapeutic activities    OT Goals(Current goals can be found in the care plan section) Acute Rehab OT Goals Patient Stated Goal: to go home OT Goal Formulation: With patient Time For Goal Achievement: 05/28/17 Potential to Achieve Goals: Good ADL Goals Pt Will Perform Eating: with set-up;sitting Pt Will Perform Grooming: with min guard assist;standing Pt Will Perform Upper Body Dressing: with set-up;sitting Pt Will Perform Lower Body Dressing: with min assist;sit to/from stand Pt Will Transfer to Toilet: with min guard  assist;ambulating Pt Will Perform Toileting - Clothing Manipulation and hygiene: with min guard assist;sit to/from stand  OT Frequency: Min 2X/week   Barriers to D/C:            Co-evaluation              AM-PAC PT "6 Clicks" Daily Activity     Outcome Measure Help from another person eating meals?: A Lot Help from another person taking care of personal grooming?: A Lot Help from another person toileting, which includes using toliet, bedpan, or urinal?: A Lot Help from another person bathing (including washing, rinsing, drying)?: A Lot Help from another person to put on and taking off regular upper body clothing?: A Little Help from another person to put on and taking off regular lower body clothing?: A Lot 6 Click Score: 13   End of Session Equipment Utilized During Treatment: Gait belt;Rolling walker Nurse Communication: Other (comment) (NT replaced condom cath)  Activity Tolerance: Patient tolerated treatment well Patient left: in bed;with call bell/phone within reach;with nursing/sitter in room;with family/visitor present  OT Visit Diagnosis: Unsteadiness on feet (R26.81);Muscle weakness (generalized) (M62.81);Low vision, both eyes (H54.2);Other symptoms and signs involving cognitive function                Time: 5027-7412 OT Time Calculation (min): 24 min Charges:  OT General Charges $OT Visit: 1 Procedure OT Evaluation $OT  Re-eval: 1 Procedure OT Treatments $Self Care/Home Management : 8-22 mins G-Codes:     Malka So 05/14/2017, 3:57 PM  [phone]

## 2017-05-14 NOTE — Progress Notes (Signed)
7. S/W  KEN @  [facility] # [id number]   1. ELIQUIS  2.5 MG BID   COVER- YES  CO-PAY- $ 45.00  TIER- 3 DRUG  PRIOR APPROVAL- NO   2. ELIQUIS 5 MG BID   COVER- YES  CO-PAY- $ 45.00  TIER- 3 DRUG  PRIOR APPROVAL- NO   3. XARELTO 15 MG BID   COVER- YES  CO-PAY- $ 45.00  TIER- 3 DRUG  PRIOR APPROVAL- NO   4. XARELTO  20 MG DAILY   COVER- YES  CO-PAY- $ 45.00\  TIER- 3 DRUG  PRIOR APPROVAL- NO   PHARMACY : [facility]

## 2017-05-14 NOTE — Care Management Note (Signed)
Case Management Note  Patient Details  Name: Dustin Leonard MRN: [id number] Date of Birth: 07-Apr-1929  Subjective/Objective:     From home with wife, pta indep, he uses a walker sometimes at home.   He has a PCP, he has medication coverage.  Per pt eval rec HHPT , wife states they do not want HHPT, state they do not really need it, her daughter is a Therapist, sports and she helps out everyday.  Wife states they go to [place] on [address] [place] [address].                 Action/Plan: NCM will follow for dc needs.   Expected Discharge Date:  05/13/17               Expected Discharge Plan:  [place]  In-House Referral:     Discharge planning Services  CM Consult  Post Acute Care Choice:    Choice offered to:     DME Arranged:    DME Agency:     HH Arranged:    [facility] Agency:     Status of Service:  In process, will continue to follow  If discussed at Long Length of Stay Meetings, dates discussed:    Additional Comments:  Zenon Mayo, RN 05/14/2017, 4:56 PM

## 2017-05-14 NOTE — Progress Notes (Signed)
[facility] for apixiban Indication: afib  No Known Allergies  Labs:  Recent Labs  05/12/17 0248 05/12/17 1713 05/13/17 0346 05/14/17 0246  HGB 11.0*  --  11.2* 11.7*  HCT 34.7*  --  34.3* 36.7*  PLT 149*  --  130* 141*  HEPARINUNFRC 0.23* <0.10*  --   --   CREATININE 1.94*  --  1.53* 1.46*    Estimated Creatinine Clearance: 38.7 mL/min (A) (by C-G formula based on SCr of 1.46 mg/dL (H)).   Assessment: 83 yo male with aflutter and now in NSR s/p DCCV on 7/20.  Apixiban 2.5mg  po bid was started 7/21 and he is now on 5mg  po bid.Pharmacy consulted to review the dosing -SCr= 1.46 with trend down (2.27 on 7/20; noted 0.9--1.3 in 2017), CrCl ~ 44 (using TBW) -Wt= 87.1kg  Goal of Therapy:  Monitor platelets by anticoagulation protocol: Yes   Plan:  -Apixiban dose of 5mg  po bid is appropriate based on patient parameters -Will provide patient education  Hildred Laser, Pharm D 05/14/2017 11:28 AM

## 2017-05-14 NOTE — Anesthesia Postprocedure Evaluation (Signed)
Anesthesia Post Note  Patient: Dustin Leonard  Procedure(s) Performed: Procedure(s) (LRB): TRANSESOPHAGEAL ECHOCARDIOGRAM (TEE) (N/A) CARDIOVERSION (N/A)     Patient location during evaluation: PACU Anesthesia Type: General Level of consciousness: awake and alert and patient cooperative Pain management: pain level controlled Vital Signs Assessment: post-procedure vital signs reviewed and stable Respiratory status: spontaneous breathing and respiratory function stable Cardiovascular status: stable Anesthetic complications: no    Last Vitals:  Vitals:   05/14/17 0100 05/14/17 0403  BP: (!) 157/82 (!) 149/73  Pulse: 90 81  Resp:  (!) 22  Temp:  36.9 C    Last Pain:  Vitals:   05/14/17 0403  TempSrc: Oral  PainSc:    Pain Goal:                 [name] S

## 2017-05-15 LAB — CBC
HEMATOCRIT: 37.7 % — AB (ref 39.0–52.0)
HEMOGLOBIN: 12.2 g/dL — AB (ref 13.0–17.0)
MCH: 28.6 pg (ref 26.0–34.0)
MCHC: 32.4 g/dL (ref 30.0–36.0)
MCV: 88.3 fL (ref 78.0–100.0)
Platelets: 140 10*3/uL — ABNORMAL LOW (ref 150–400)
RBC: 4.27 MIL/uL (ref 4.22–5.81)
RDW: 14 % (ref 11.5–15.5)
WBC: 9.2 10*3/uL (ref 4.0–10.5)

## 2017-05-15 LAB — BASIC METABOLIC PANEL
ANION GAP: 7 (ref 5–15)
ANION GAP: 7 (ref 5–15)
BUN: 36 mg/dL — ABNORMAL HIGH (ref 6–20)
BUN: 38 mg/dL — ABNORMAL HIGH (ref 6–20)
CALCIUM: 8.5 mg/dL — AB (ref 8.9–10.3)
CALCIUM: 8.5 mg/dL — AB (ref 8.9–10.3)
CHLORIDE: 107 mmol/L (ref 101–111)
CO2: 21 mmol/L — AB (ref 22–32)
CO2: 22 mmol/L (ref 22–32)
CREATININE: 1.29 mg/dL — AB (ref 0.61–1.24)
CREATININE: 1.46 mg/dL — AB (ref 0.61–1.24)
Chloride: 106 mmol/L (ref 101–111)
GFR calc non Af Amer: 42 mL/min — ABNORMAL LOW (ref >60)
GFR calc non Af Amer: 48 mL/min — ABNORMAL LOW (ref >60)
GFR, EST AFRICAN AMERICAN: 48 mL/min — AB (ref >60)
GFR, EST AFRICAN AMERICAN: 56 mL/min — AB (ref >60)
Glucose, Bld: 137 mg/dL — ABNORMAL HIGH (ref 65–99)
Glucose, Bld: 258 mg/dL — ABNORMAL HIGH (ref 65–99)
Potassium: 4.5 mmol/L (ref 3.5–5.1)
Potassium: 4.7 mmol/L (ref 3.5–5.1)
SODIUM: 135 mmol/L (ref 135–145)
SODIUM: 135 mmol/L (ref 135–145)

## 2017-05-15 LAB — GLUCOSE, CAPILLARY
GLUCOSE-CAPILLARY: 91 mg/dL (ref 65–99)
Glucose-Capillary: 158 mg/dL — ABNORMAL HIGH (ref 65–99)

## 2017-05-15 MED ORDER — LEVALBUTEROL HCL 0.63 MG/3ML IN NEBU
0.6300 mg | INHALATION_SOLUTION | Freq: Two times a day (BID) | RESPIRATORY_TRACT | Status: DC
  Administered 2017-05-15: 0.63 mg via RESPIRATORY_TRACT
  Filled 2017-05-15: qty 3

## 2017-05-15 MED ORDER — AMIODARONE HCL 200 MG PO TABS: 200.0000 mg | ORAL_TABLET | Freq: Two times a day (BID) | ORAL | Status: DC

## 2017-05-15 MED ORDER — APIXABAN 5 MG PO TABS: 5.0000 mg | ORAL_TABLET | Freq: Two times a day (BID) | ORAL | 0 refills | Status: DC

## 2017-05-15 MED ORDER — LEVALBUTEROL HCL 1.25 MG/0.5ML IN NEBU: 1.2500 mg | INHALATION_SOLUTION | Freq: Four times a day (QID) | RESPIRATORY_TRACT | 12 refills | Status: DC | PRN

## 2017-05-15 MED ORDER — RAMIPRIL 2.5 MG PO CAPS
2.5000 mg | ORAL_CAPSULE | Freq: Every day | ORAL | Status: DC
  Administered 2017-05-15: 2.5 mg via ORAL
  Filled 2017-05-15: qty 1

## 2017-05-15 MED ORDER — APIXABAN 5 MG PO TABS
5.0000 mg | ORAL_TABLET | Freq: Two times a day (BID) | ORAL | Status: DC
  Administered 2017-05-15: 5 mg via ORAL
  Filled 2017-05-15: qty 1

## 2017-05-15 MED ORDER — IPRATROPIUM BROMIDE 0.02 % IN SOLN
0.5000 mg | Freq: Two times a day (BID) | RESPIRATORY_TRACT | Status: DC
  Administered 2017-05-15: 0.5 mg via RESPIRATORY_TRACT
  Filled 2017-05-15: qty 2.5

## 2017-05-15 MED ORDER — INSULIN DETEMIR 100 UNIT/ML ~~LOC~~ SOLN: 20.0000 [IU] | Freq: Two times a day (BID) | SUBCUTANEOUS | 11 refills | Status: DC

## 2017-05-15 MED ORDER — MOMETASONE FURO-FORMOTEROL FUM 200-5 MCG/ACT IN AERO: 2.0000 | INHALATION_SPRAY | Freq: Two times a day (BID) | RESPIRATORY_TRACT | 0 refills | Status: DC

## 2017-05-15 MED ORDER — AMIODARONE HCL 200 MG PO TABS: 200.0000 mg | ORAL_TABLET | Freq: Every day | ORAL | 0 refills | Status: DC

## 2017-05-15 MED ORDER — RAMIPRIL 2.5 MG PO CAPS
2.5000 mg | ORAL_CAPSULE | Freq: Every day | ORAL | 0 refills | Status: DC
Start: 2017-05-16 — End: 2024-01-18

## 2017-05-15 MED ORDER — LEVOFLOXACIN 500 MG PO TABS: 500.0000 mg | ORAL_TABLET | Freq: Every day | ORAL | 0 refills | Status: AC

## 2017-05-15 MED ORDER — METOPROLOL SUCCINATE ER 25 MG PO TB24: 25.0000 mg | ORAL_TABLET | Freq: Every day | ORAL | 0 refills | Status: DC

## 2017-05-15 NOTE — Plan of Care (Signed)
Problem: Physical Regulation: Goal: Ability to maintain clinical measurements within normal limits will improve Outcome: Progressing Potassium level elevated, kayexelate given

## 2017-05-15 NOTE — Progress Notes (Signed)
Family Medicine Teaching Service Daily Progress Note Intern Pager: [phone]  Patient name: Dustin Leonard Medical record number: [id number] Date of birth: Feb 14, 1929 Age: 83 y.o. Gender: male  Primary Care Provider: Katherina Mires, MD  Consultants: Cardiology Code Status: FULL   Pt Overview and Major Events to Date:  Dustin Leonard is a 83 year old male who presented on 05/09/17 for a one week hx of SOB, wheezing, and nonproductive cough.  He has a PMH significant for glaucoma, COPD, prostate cancer, stroke, TIA, and T2DM.    Assessment and Plan: Dustin Leonard is a 83 y.o. male presenting with SOB x 1 week . PMH is significant for glaucoma, COPD, prostate cancer, stroke, TIA, and T2DM.   SOB d/t COPD exacerbation Improving.  Patient on RA with good O2 sats this morning of >95%.  Exam with slight wheezes bilaterally.  No edema or signs of fluid overload present and breathing is comfortable.  Shortness of breath was likely a combination of COPD exacerbation and fluid overload.  CXR with cardiac enlargement with bibasilar atelectasis and atherosclerotic aorta.  CT angio negative for PE. -ipratropium q6h  -levalbuterol q6h/1h prn (avoiding albuterol due to patient's tachycardia)   -Continue levofloxacin 750 mg (Day 6)  -dulera bid, initiating controller medication as patient had hospitalization for COPD exacerbation within the past year   SOB d/t CHF exacerbation Echo on 7/19 revealed EF of 30-35% (same as on previous echo) and severely reduced LV function. EKG on 7/19 showed new onset atrial flutter, variable AV block, T wave ischemia.   Digoxin was given once on 7/19 by cardiology.  40 mg IV Lasix given on 7/20 for volume overload.  Initially on dilt drip.  S/p TEE and cardioversion by Cardiology on 7/20. Transitioned from amiodarone drip to PO amiodarone on 7/22.    -cardiology following, appreciate recs  -re-assess volume status daily, will dose more lasix if needed -start 25 mg metoprolol  succinate daily -daily weights -strict I's and O's -PT/OT to assess  New onset Atrial flutter  Patient presented to ED with new onset atrial flutter. HR of 131. Was put on Cardizem drip in ED with resulting correction of heart rate. Tachycardia likely worsened in the setting of albuterol treatment. CHADSVASC score of 6. No history of bleeding.  TSH wnl at 0.437.  Troponins remained low.  S/p TEE and cardioversion on 7/20, pt now on PO Amiodarone.  Tachycardia has improved this morning to 80-90's.  Per cardiology, can transition to renally dosed oral anticoagulation.  Discussed with pharmacy 7/21 and started on Eliquis 2.5 mg BID, which was then increased with improved kidney function.   -continue PO Amiodarone 400 mg daily for two days, then change to 200 mg daily, this can be done outpatient according to cardiology -cards recs as above  -Eliquis initially increased to 5 mg BID and approved by pharmacy, but changed to 2.5 mg BID with increasing creatinine (1.46-->1.57).  However, creatinine has now decreased to 1.26, so will go back to 5 mg BID  AKI: Improving.  Cr 1.58 on admission (bl 0.9-1.0). Cr increased to 2.27 on 7/20 however has improved to 1.26 7/24.  -restart Ramipril  COPD Patient has history of COPD. Was followed by Dr. Annamaria Boots, (Pulmonology), several years ago but has not had follow up recent due to no recent SOB. CXR showed cardiac enlargement with bibasilar atelectasis and atherosclerotic aorta. -breathing treatments as above   T2DM A1c on this admission 11.5.  Patient on home levemir 47 units prn  if glucose over 190. Last hemoglobin A1c was 10 (Sept 2017).  Glucose relatively well controlled on current regimen -sensitive sliding scale  -CBGs AC/HS - AM serum glucose 137  -continue Levemir 20 U BID -family is resistant to changing diabetes regimen even after I told them that we could change him to once daily insulin, so we will most likely need to keep him on his home  regimen  History of CVA: Reportedly without residual deficits.  -continue ASA 81 mg  -care everywhere PCP note states he is on Plavix and a statin, however home pharmacy says he is not currently prescribed these medications.  This is an issue for follow-up on D/C summary  Hyperkalemia: K+ 5.7 on 7/23, resolved after Kayexalate to 4.5 on 7/24. - am EKG on 7/23 negative for peaked T waves - restarted ramipril  FEN/GI: HH/carb modified diet  Prophylaxis: heparin   Disposition: Home  Subjective:  Sitting comfortably in bed with no complaints.  Says his shortness of breath continues to be improved and denies chest pain.  His wife says that there have been changes to his medicines for no reason and that we are purposely keeping him in the hospital unnecessarily.  I assured her that we wanted him to be able to go home as soon and as safely as possible.  Objective: Temp:  [97.7 F (36.5 C)-98.7 F (37.1 C)] 97.7 F (36.5 C) (07/24 0300) Pulse Rate:  [72-101] 72 (07/24 0300) Resp:  [14-23] 19 (07/24 0300) BP: (123-161)/(58-86) 123/58 (07/24 0300) SpO2:  [98 %-100 %] 99 % (07/24 0300)   Physical Exam: General: pleasant 83 yo M, comfortable appearing, sitting up in hospital bed breathing comfortably on room air Cardiovascular: RRR, no MRG  Respiratory: lungs CTAB, no rhonchi rales or crackles noted  Abdomen: firm, nontender, +bowel sounds Extremities: 1+ edema in bilateral lower extremities, hands not as swollen today Neuro: AAOx4   Laboratory:  Recent Labs Lab 05/13/17 0346 05/14/17 0246 05/15/17 0514  WBC 8.5 7.6 9.2  HGB 11.2* 11.7* 12.2*  HCT 34.3* 36.7* 37.7*  PLT 130* 141* 140*    Recent Labs Lab 05/09/17 1212  05/14/17 1446 05/15/17 0007 05/15/17 0514  NA 136  < > 133* 135 135  K 5.0  < > 6.3* 4.7 4.5  CL 107  < > 103 106 107  CO2 22  < > 24 22 21*  BUN 22*  < > 36* 38* 36*  CREATININE 1.58*  < > 1.57* 1.46* 1.29*  CALCIUM 8.8*  < > 8.9 8.5* 8.5*  PROT 6.4*   --   --   --   --   BILITOT 0.9  --   --   --   --   ALKPHOS 77  --   --   --   --   ALT 10*  --   --   --   --   AST 14*  --   --   --   --   GLUCOSE 268*  < > 320* 258* 137*  < > = values in this interval not displayed. Imaging/Diagnostic Tests: ECHO on 05/10/17:  Study Conclusions  - Left ventricle: The cavity size was normal. Wall thickness was   increased in a pattern of mild LVH. Systolic function was   moderately to severely reduced. The estimated ejection fraction   was in the range of 30% to 35%. Dyskinesis of the anteroseptal   myocardium and akinesis of the inferior and inferoseptum. Doppler  parameters are consistent with high ventricular filling pressure. - Aortic valve: Transvalvular velocity was within the normal range.   There was no stenosis. There was trivial regurgitation. - Mitral valve: Mildly calcified annulus. Transvalvular velocity   was within the normal range. There was no evidence for stenosis.   There was mild regurgitation. - Left atrium: The atrium was moderately dilated. - Right ventricle: The cavity size was normal. Wall thickness was   normal. Systolic function was normal. - Pulmonary arteries: Systolic pressure was mildly increased. PA   peak pressure: 46 mm Hg (S).  Kathrene Alu, MD 05/15/2017, 6:51 AM PGY-1, [facility] Intern pager: [phone], text pages welcome

## 2017-05-15 NOTE — Progress Notes (Signed)
Progress Note  Patient Name: Dustin Leonard Date of Encounter: 05/15/2017  Primary Cardiologist:  Subjective   Pt resting comfortably  No CP  NO SOB    Inpatient Medications    Scheduled Meds: . amiodarone  400 mg Oral BID  . apixaban  5 mg Oral BID  . aspirin  81 mg Oral Daily  . insulin aspart  0-9 Units Subcutaneous TID WC  . insulin detemir  20 Units Subcutaneous BID  . ipratropium  0.5 mg Nebulization BID  . levalbuterol  0.63 mg Nebulization BID  . levofloxacin  500 mg Oral Daily  . metoprolol succinate  25 mg Oral Daily  . mometasone-formoterol  2 puff Inhalation BID  . ramipril  2.5 mg Oral Daily  . sodium chloride flush  3 mL Intravenous Q12H   Continuous Infusions:  PRN Meds: levalbuterol   Vital Signs    Vitals:   05/15/17 0800 05/15/17 0900 05/15/17 1000 05/15/17 1219  BP: 134/68 136/72 125/60   Pulse: 70 78 80   Resp: (!) 23 (!) 23 20   Temp:    98 F (36.7 C)  TempSrc:    Oral  SpO2: 100% 97% 98%   Weight:      Height:        Intake/Output Summary (Last 24 hours) at 05/15/17 1253 Last data filed at 05/15/17 1219  Gross per 24 hour  Intake             1080 ml  Output             1525 ml  Net             -445 ml   Filed Weights   05/11/17 0301 05/11/17 1326 05/12/17 0404  Weight: 187 lb (84.8 kg) 187 lb (84.8 kg) 192 lb (87.1 kg)    Telemetry    SR   - Personally Reviewed  ECG      Physical Exam   GEN: No acute distress.   Neck: No JVD Cardiac: RRR, no murmurs, rubs, or gallops.  Respiratory: Clear to auscultation bilaterally. GI: Soft, nontender, non-distended  MS: No edema; No deformity. Neuro:  Nonfocal  Psych: Normal affect   Labs    Chemistry Recent Labs Lab 05/09/17 1212  05/14/17 1446 05/15/17 0007 05/15/17 0514  NA 136  < > 133* 135 135  K 5.0  < > 6.3* 4.7 4.5  CL 107  < > 103 106 107  CO2 22  < > 24 22 21*  GLUCOSE 268*  < > 320* 258* 137*  BUN 22*  < > 36* 38* 36*  CREATININE 1.58*  < > 1.57* 1.46*  1.29*  CALCIUM 8.8*  < > 8.9 8.5* 8.5*  PROT 6.4*  --   --   --   --   ALBUMIN 3.3*  --   --   --   --   AST 14*  --   --   --   --   ALT 10*  --   --   --   --   ALKPHOS 77  --   --   --   --   BILITOT 0.9  --   --   --   --   GFRNONAA 38*  < > 38* 42* 48*  GFRAA 44*  < > 44* 48* 56*  ANIONGAP 7  < > 6 7 7   < > = values in this interval not displayed.   Hematology Recent  Labs Lab 05/13/17 0346 05/14/17 0246 05/15/17 0514  WBC 8.5 7.6 9.2  RBC 3.90* 4.14* 4.27  HGB 11.2* 11.7* 12.2*  HCT 34.3* 36.7* 37.7*  MCV 87.9 88.6 88.3  MCH 28.7 28.3 28.6  MCHC 32.7 31.9 32.4  RDW 14.0 14.1 14.0  PLT 130* 141* 140*    Cardiac Enzymes Recent Labs Lab 05/10/17 0221 05/10/17 0718 05/10/17 1343  TROPONINI 0.03* 0.03* 0.04*    Recent Labs Lab 05/09/17 1239  TROPIPOC 0.01     BNP Recent Labs Lab 05/10/17 0718  BNP 590.2*     DDimer  Recent Labs Lab 05/09/17 1212  DDIMER 0.70*     Radiology    No results found.  Cardiac Studies     Patient Profile       Assessment & Plan    1  Atrial flutter  Remains in SR  Continue Eliquis 5 bid (Cr improved)  Keep on amiodarone 200 mg bid Not daily   Chronic systlic CHF  Volume status is OK   Signed, Dorris Carnes, MD  05/15/2017, 12:53 PM

## 2017-05-15 NOTE — Discharge Instructions (Addendum)

## 2017-05-15 NOTE — Progress Notes (Signed)
Explained and discussed discharge instructions to caregiver, prescriptions and follow up appts given to caregiver. No complaints voiced at this time. Pt going home with belongings with caregiver.

## 2017-05-15 NOTE — Care Management (Signed)
1538 05-15-17 Late Entry: Pt was d/c before CM was able to speak with family. CM did call patient and wife in regards to Physical Therapy. Pt/ wife declined [facility]. CM did state to both in regards to contacting PCP for [facility] if he needs anything once he gets home. No further needs from CM at this time. Bethena Roys, RN, BSN Case Manager [phone]

## 2017-07-09 ENCOUNTER — Other Ambulatory Visit: Payer: Self-pay | Admitting: Family Medicine

## 2017-08-06 ENCOUNTER — Other Ambulatory Visit: Payer: Self-pay | Admitting: Family Medicine

## 2017-12-12 IMAGING — DX DG CHEST 2V
2 series · 2 of 2 positions shown · non-contrast
Comparison: Radiograph June 30, 2016.

CLINICAL DATA: Pneumonia.

EXAM:
CHEST  2 VIEW

[x chest ap]
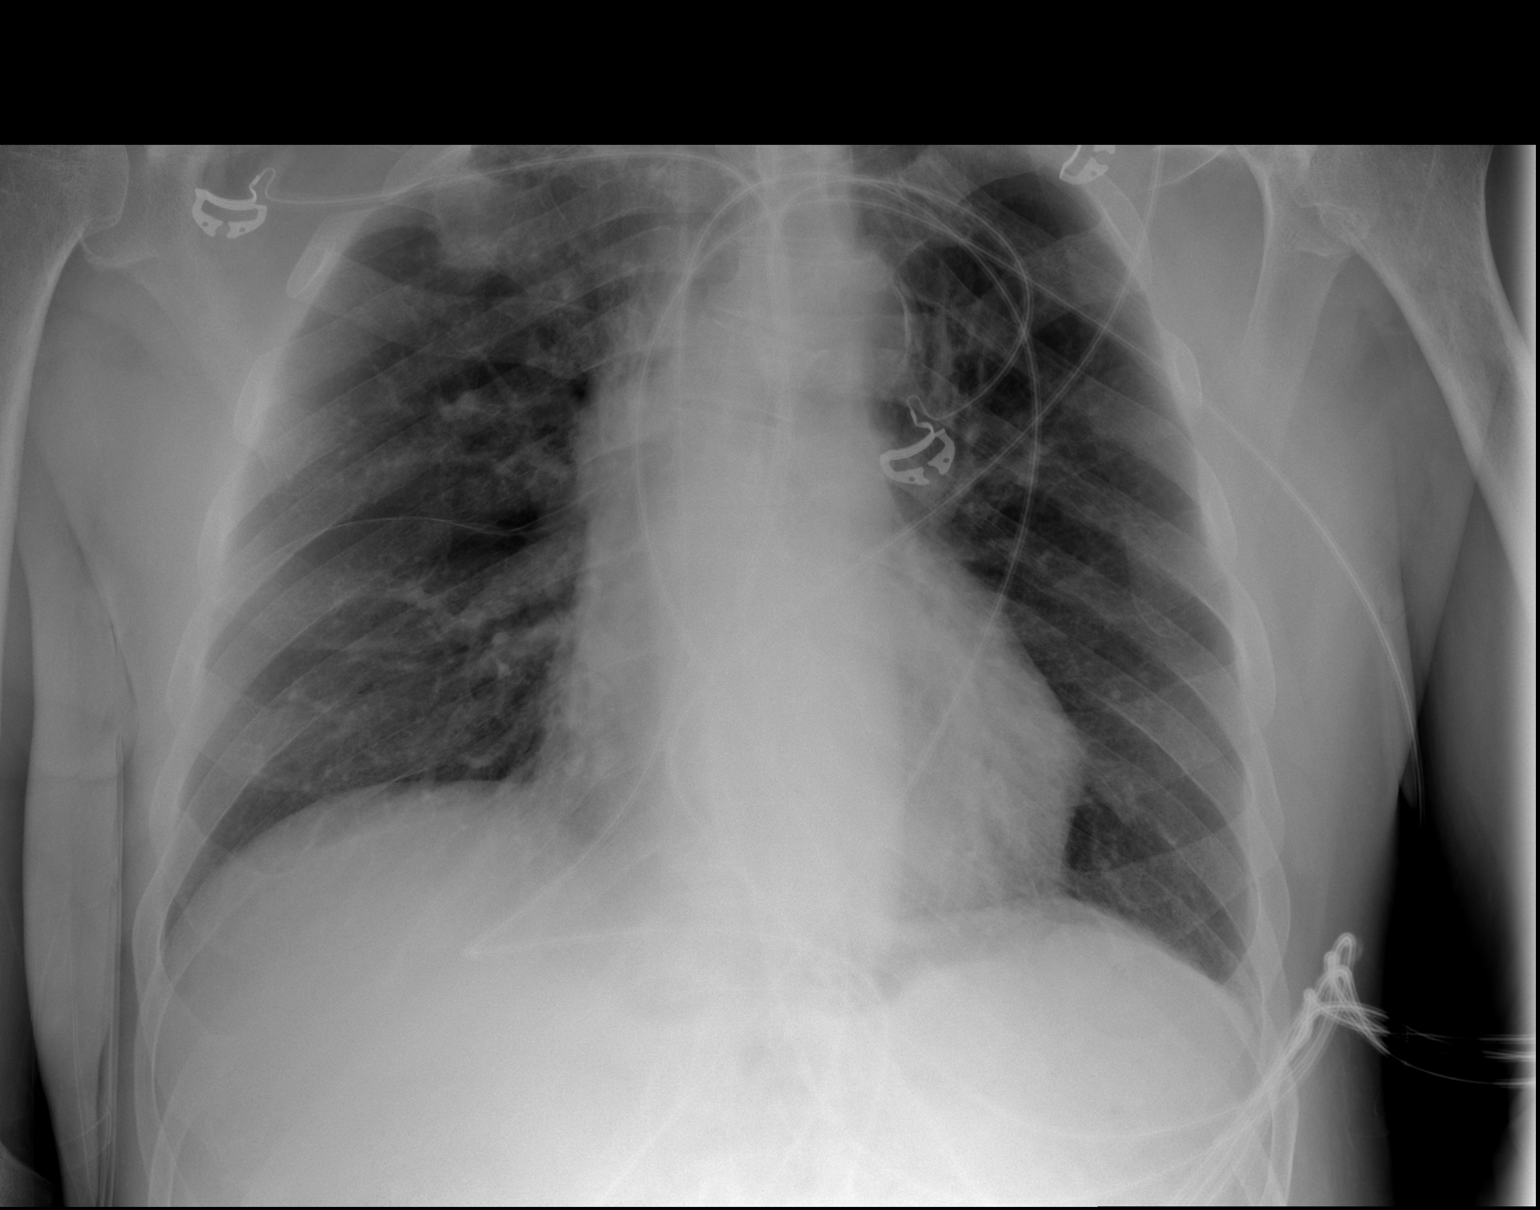

[w chest lat]
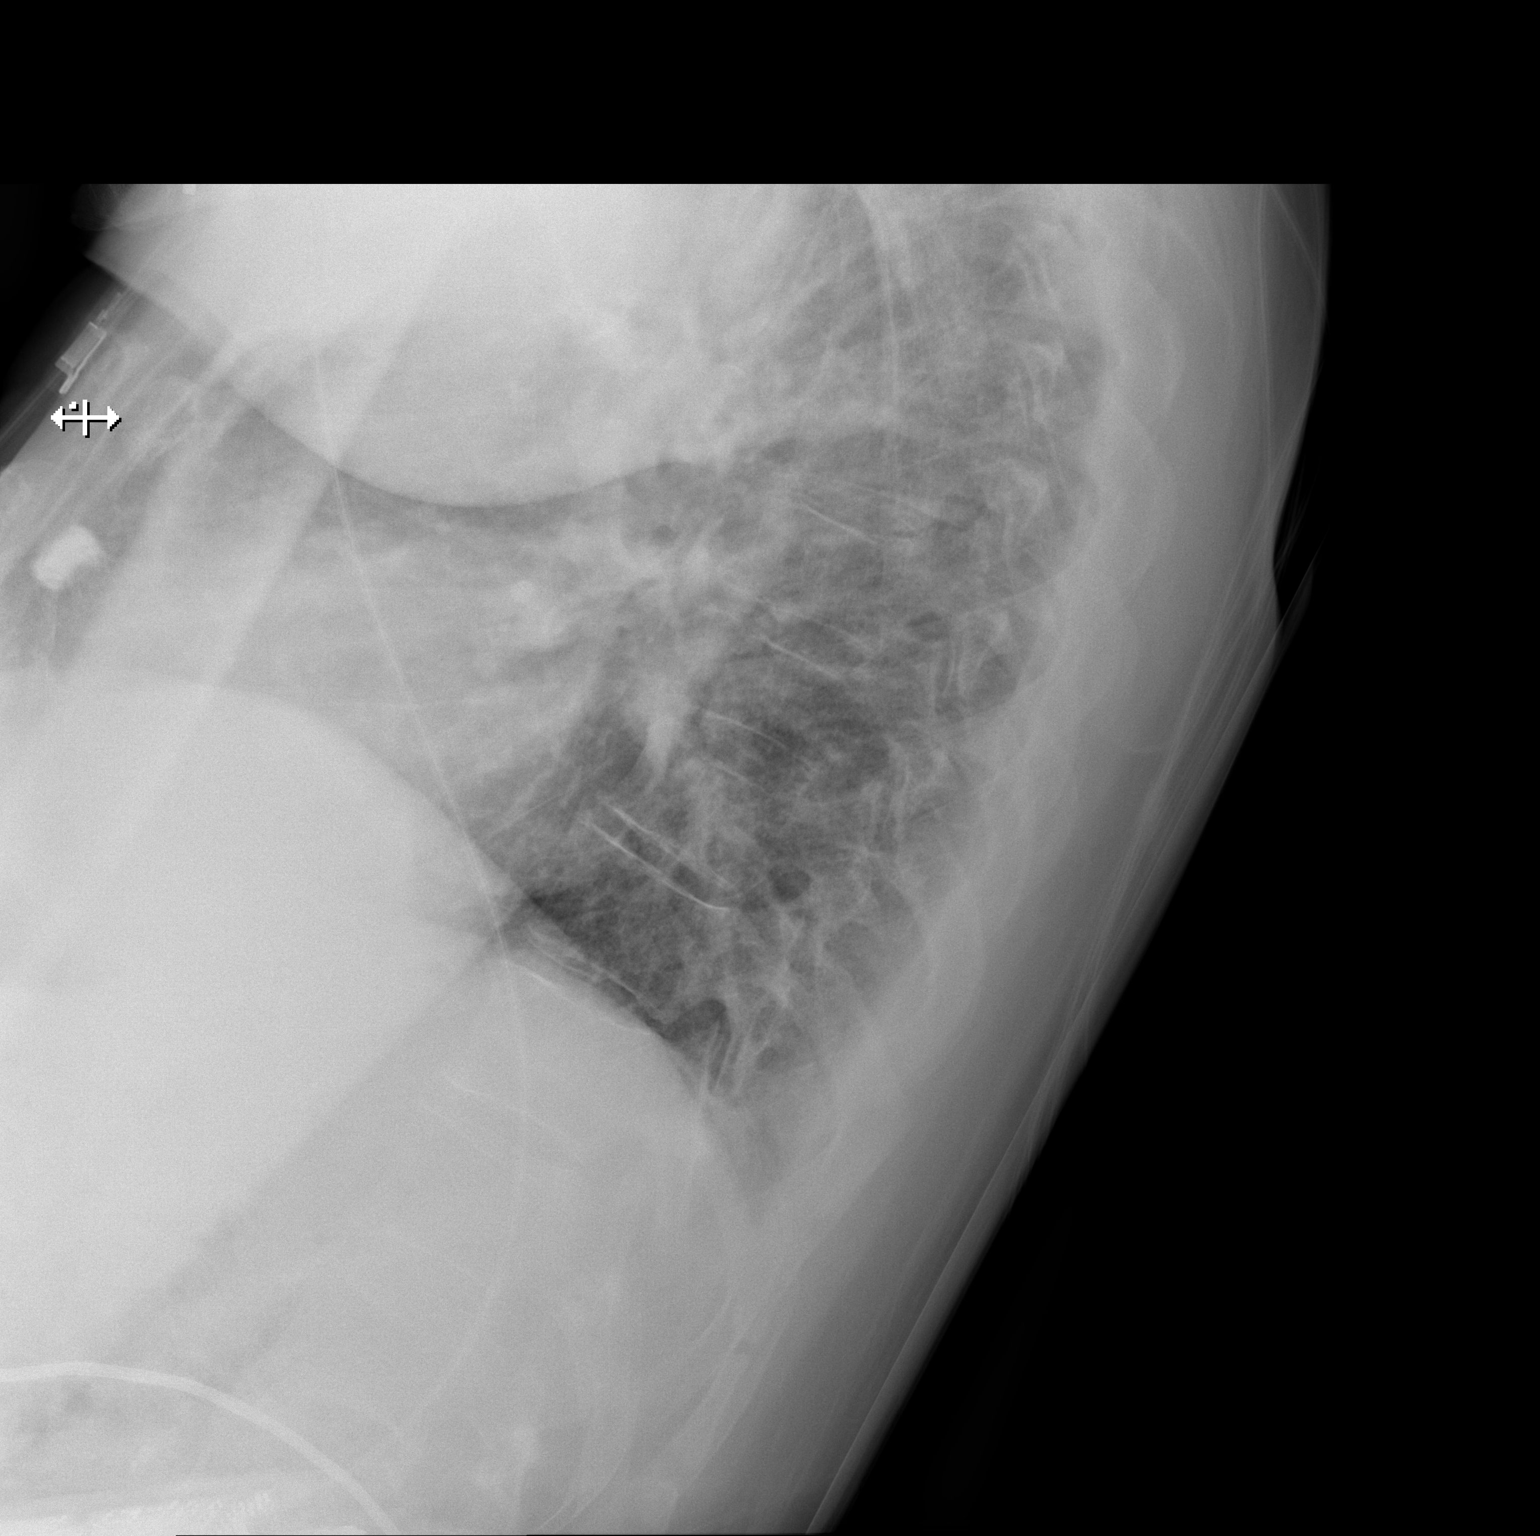

[2 of 2 positions shown; findings below may reference images not displayed]

FINDINGS: Stable cardiomediastinal silhouette. Atherosclerosis thoracic aorta
is noted. No pneumothorax or pleural effusion is noted. No acute
pulmonary disease is noted. Bony thorax is unremarkable.
IMPRESSION: No active cardiopulmonary disease.  Aortic atherosclerosis.

## 2018-05-06 ENCOUNTER — Encounter (HOSPITAL_COMMUNITY): Payer: Self-pay | Admitting: Emergency Medicine

## 2018-05-06 ENCOUNTER — Inpatient Hospital Stay (HOSPITAL_COMMUNITY)
Admission: EM | Admit: 2018-05-06 | Discharge: 2018-05-10 | DRG: 291 | Disposition: A | Discharge status: Home / Self Care | Payer: Medicare Other | Attending: Internal Medicine | Admitting: Internal Medicine

## 2018-05-06 ENCOUNTER — Emergency Department (HOSPITAL_COMMUNITY): Payer: Medicare Other

## 2018-05-06 DIAGNOSIS — H42 Glaucoma in diseases classified elsewhere: Secondary | ICD-10-CM | POA: Diagnosis present

## 2018-05-06 DIAGNOSIS — N179 Acute kidney failure, unspecified: Secondary | ICD-10-CM | POA: Diagnosis present

## 2018-05-06 DIAGNOSIS — E1136 Type 2 diabetes mellitus with diabetic cataract: Secondary | ICD-10-CM | POA: Diagnosis present

## 2018-05-06 DIAGNOSIS — I5043 Acute on chronic combined systolic (congestive) and diastolic (congestive) heart failure: Secondary | ICD-10-CM | POA: Diagnosis present

## 2018-05-06 DIAGNOSIS — R0602 Shortness of breath: Secondary | ICD-10-CM

## 2018-05-06 DIAGNOSIS — N183 Chronic kidney disease, stage 3 (moderate): Secondary | ICD-10-CM | POA: Diagnosis not present

## 2018-05-06 DIAGNOSIS — N189 Chronic kidney disease, unspecified: Secondary | ICD-10-CM | POA: Diagnosis present

## 2018-05-06 DIAGNOSIS — E1122 Type 2 diabetes mellitus with diabetic chronic kidney disease: Secondary | ICD-10-CM | POA: Diagnosis present

## 2018-05-06 DIAGNOSIS — J9600 Acute respiratory failure, unspecified whether with hypoxia or hypercapnia: Secondary | ICD-10-CM | POA: Diagnosis present

## 2018-05-06 DIAGNOSIS — Z87891 Personal history of nicotine dependence: Secondary | ICD-10-CM | POA: Diagnosis not present

## 2018-05-06 DIAGNOSIS — Z79899 Other long term (current) drug therapy: Secondary | ICD-10-CM

## 2018-05-06 DIAGNOSIS — J441 Chronic obstructive pulmonary disease with (acute) exacerbation: Secondary | ICD-10-CM | POA: Diagnosis not present

## 2018-05-06 DIAGNOSIS — I447 Left bundle-branch block, unspecified: Secondary | ICD-10-CM | POA: Diagnosis present

## 2018-05-06 DIAGNOSIS — E11649 Type 2 diabetes mellitus with hypoglycemia without coma: Secondary | ICD-10-CM | POA: Diagnosis not present

## 2018-05-06 DIAGNOSIS — J439 Emphysema, unspecified: Secondary | ICD-10-CM | POA: Diagnosis present

## 2018-05-06 DIAGNOSIS — H9190 Unspecified hearing loss, unspecified ear: Secondary | ICD-10-CM | POA: Diagnosis present

## 2018-05-06 DIAGNOSIS — E785 Hyperlipidemia, unspecified: Secondary | ICD-10-CM | POA: Diagnosis present

## 2018-05-06 DIAGNOSIS — N184 Chronic kidney disease, stage 4 (severe): Secondary | ICD-10-CM | POA: Diagnosis present

## 2018-05-06 DIAGNOSIS — I4892 Unspecified atrial flutter: Secondary | ICD-10-CM | POA: Diagnosis present

## 2018-05-06 DIAGNOSIS — E1139 Type 2 diabetes mellitus with other diabetic ophthalmic complication: Secondary | ICD-10-CM | POA: Diagnosis present

## 2018-05-06 DIAGNOSIS — Z515 Encounter for palliative care: Secondary | ICD-10-CM

## 2018-05-06 DIAGNOSIS — I69392 Facial weakness following cerebral infarction: Secondary | ICD-10-CM

## 2018-05-06 DIAGNOSIS — I482 Chronic atrial fibrillation: Secondary | ICD-10-CM | POA: Diagnosis present

## 2018-05-06 DIAGNOSIS — Z794 Long term (current) use of insulin: Secondary | ICD-10-CM

## 2018-05-06 DIAGNOSIS — E1129 Type 2 diabetes mellitus with other diabetic kidney complication: Secondary | ICD-10-CM | POA: Diagnosis present

## 2018-05-06 DIAGNOSIS — H409 Unspecified glaucoma: Secondary | ICD-10-CM | POA: Diagnosis present

## 2018-05-06 DIAGNOSIS — Z833 Family history of diabetes mellitus: Secondary | ICD-10-CM

## 2018-05-06 DIAGNOSIS — Z66 Do not resuscitate: Secondary | ICD-10-CM | POA: Diagnosis not present

## 2018-05-06 DIAGNOSIS — I351 Nonrheumatic aortic (valve) insufficiency: Secondary | ICD-10-CM | POA: Diagnosis not present

## 2018-05-06 DIAGNOSIS — I255 Ischemic cardiomyopathy: Secondary | ICD-10-CM | POA: Diagnosis present

## 2018-05-06 DIAGNOSIS — I130 Hypertensive heart and chronic kidney disease with heart failure and stage 1 through stage 4 chronic kidney disease, or unspecified chronic kidney disease: Secondary | ICD-10-CM | POA: Diagnosis present

## 2018-05-06 DIAGNOSIS — I509 Heart failure, unspecified: Secondary | ICD-10-CM | POA: Diagnosis not present

## 2018-05-06 DIAGNOSIS — I959 Hypotension, unspecified: Secondary | ICD-10-CM | POA: Diagnosis not present

## 2018-05-06 DIAGNOSIS — Z7902 Long term (current) use of antithrombotics/antiplatelets: Secondary | ICD-10-CM

## 2018-05-06 DIAGNOSIS — E1165 Type 2 diabetes mellitus with hyperglycemia: Secondary | ICD-10-CM

## 2018-05-06 DIAGNOSIS — J9601 Acute respiratory failure with hypoxia: Secondary | ICD-10-CM | POA: Diagnosis present

## 2018-05-06 DIAGNOSIS — M7989 Other specified soft tissue disorders: Secondary | ICD-10-CM | POA: Diagnosis not present

## 2018-05-06 DIAGNOSIS — Z8546 Personal history of malignant neoplasm of prostate: Secondary | ICD-10-CM

## 2018-05-06 DIAGNOSIS — Z7982 Long term (current) use of aspirin: Secondary | ICD-10-CM

## 2018-05-06 DIAGNOSIS — Z7951 Long term (current) use of inhaled steroids: Secondary | ICD-10-CM

## 2018-05-06 DIAGNOSIS — I4891 Unspecified atrial fibrillation: Secondary | ICD-10-CM | POA: Diagnosis not present

## 2018-05-06 DIAGNOSIS — I340 Nonrheumatic mitral (valve) insufficiency: Secondary | ICD-10-CM | POA: Diagnosis not present

## 2018-05-06 LAB — BASIC METABOLIC PANEL
Anion gap: 9 (ref 5–15)
BUN: 36 mg/dL — AB (ref 8–23)
CALCIUM: 8.7 mg/dL — AB (ref 8.9–10.3)
CO2: 27 mmol/L (ref 22–32)
Chloride: 104 mmol/L (ref 98–111)
Creatinine, Ser: 1.65 mg/dL — ABNORMAL HIGH (ref 0.61–1.24)
GFR calc Af Amer: 41 mL/min — ABNORMAL LOW (ref >60)
GFR, EST NON AFRICAN AMERICAN: 36 mL/min — AB (ref >60)
Glucose, Bld: 195 mg/dL — ABNORMAL HIGH (ref 70–99)
Potassium: 4.4 mmol/L (ref 3.5–5.1)
Sodium: 140 mmol/L (ref 135–145)

## 2018-05-06 LAB — CBC
HEMATOCRIT: 41.4 % (ref 39.0–52.0)
Hemoglobin: 12.9 g/dL — ABNORMAL LOW (ref 13.0–17.0)
MCH: 29.1 pg (ref 26.0–34.0)
MCHC: 31.2 g/dL (ref 30.0–36.0)
MCV: 93.2 fL (ref 78.0–100.0)
PLATELETS: 129 10*3/uL — AB (ref 150–400)
RBC: 4.44 MIL/uL (ref 4.22–5.81)
RDW: 13.6 % (ref 11.5–15.5)
WBC: 9.1 10*3/uL (ref 4.0–10.5)

## 2018-05-06 LAB — TROPONIN I: Troponin I: <0.03 ng/mL (ref <0.03)

## 2018-05-06 LAB — BRAIN NATRIURETIC PEPTIDE: B Natriuretic Peptide: 881.6 pg/mL — ABNORMAL HIGH (ref 0.0–100.0)

## 2018-05-06 LAB — GLUCOSE, CAPILLARY: Glucose-Capillary: 89 mg/dL (ref 70–99)

## 2018-05-06 LAB — CBG MONITORING, ED: GLUCOSE-CAPILLARY: 96 mg/dL (ref 70–99)

## 2018-05-06 MED ORDER — ALBUTEROL SULFATE (2.5 MG/3ML) 0.083% IN NEBU
5.0000 mg | INHALATION_SOLUTION | Freq: Once | RESPIRATORY_TRACT | Status: AC
  Administered 2018-05-06: 5 mg via RESPIRATORY_TRACT

## 2018-05-06 MED ORDER — ALBUTEROL SULFATE (2.5 MG/3ML) 0.083% IN NEBU
INHALATION_SOLUTION | RESPIRATORY_TRACT | Status: AC
  Filled 2018-05-06: qty 6

## 2018-05-06 MED ORDER — INSULIN ASPART 100 UNIT/ML ~~LOC~~ SOLN
0.0000 [IU] | Freq: Every day | SUBCUTANEOUS | Status: DC
  Administered 2018-05-07: 2 [IU] via SUBCUTANEOUS
  Administered 2018-05-08: 3 [IU] via SUBCUTANEOUS
  Administered 2018-05-09: 2 [IU] via SUBCUTANEOUS

## 2018-05-06 MED ORDER — PRAVASTATIN SODIUM 40 MG PO TABS
80.0000 mg | ORAL_TABLET | Freq: Every day | ORAL | Status: DC
  Administered 2018-05-06 – 2018-05-09 (×4): 80 mg via ORAL
  Filled 2018-05-06 (×5): qty 2

## 2018-05-06 MED ORDER — AMIODARONE HCL IN DEXTROSE 360-4.14 MG/200ML-% IV SOLN
30.0000 mg/h | INTRAVENOUS | Status: DC
  Administered 2018-05-07 – 2018-05-08 (×4): 30 mg/h via INTRAVENOUS
  Filled 2018-05-06 (×4): qty 200

## 2018-05-06 MED ORDER — APIXABAN 2.5 MG PO TABS
2.5000 mg | ORAL_TABLET | Freq: Two times a day (BID) | ORAL | Status: DC
  Administered 2018-05-06 – 2018-05-10 (×8): 2.5 mg via ORAL
  Filled 2018-05-06 (×9): qty 1

## 2018-05-06 MED ORDER — INSULIN DETEMIR 100 UNIT/ML ~~LOC~~ SOLN
20.0000 [IU] | Freq: Every day | SUBCUTANEOUS | Status: DC
  Filled 2018-05-06 (×2): qty 0.2

## 2018-05-06 MED ORDER — FUROSEMIDE 10 MG/ML IJ SOLN
40.0000 mg | Freq: Once | INTRAMUSCULAR | Status: AC
  Administered 2018-05-06: 40 mg via INTRAVENOUS
  Filled 2018-05-06: qty 4

## 2018-05-06 MED ORDER — IPRATROPIUM-ALBUTEROL 0.5-2.5 (3) MG/3ML IN SOLN
3.0000 mL | Freq: Three times a day (TID) | RESPIRATORY_TRACT | Status: DC
  Administered 2018-05-06 – 2018-05-08 (×5): 3 mL via RESPIRATORY_TRACT
  Filled 2018-05-06 (×5): qty 3

## 2018-05-06 MED ORDER — CLOPIDOGREL BISULFATE 75 MG PO TABS
75.0000 mg | ORAL_TABLET | Freq: Every day | ORAL | Status: DC
  Administered 2018-05-06 – 2018-05-10 (×6): 75 mg via ORAL
  Filled 2018-05-06 (×5): qty 1

## 2018-05-06 MED ORDER — DOXYCYCLINE HYCLATE 100 MG PO TABS: 100.0000 mg | ORAL_TABLET | Freq: Two times a day (BID) | ORAL | Status: DC

## 2018-05-06 MED ORDER — FUROSEMIDE 10 MG/ML IJ SOLN: 40.0000 mg | Freq: Two times a day (BID) | INTRAMUSCULAR | Status: DC

## 2018-05-06 MED ORDER — AMIODARONE LOAD VIA INFUSION
150.0000 mg | Freq: Once | INTRAVENOUS | Status: AC
  Administered 2018-05-06: 150 mg via INTRAVENOUS
  Filled 2018-05-06: qty 83.34

## 2018-05-06 MED ORDER — ASPIRIN 81 MG PO CHEW: 81.0000 mg | CHEWABLE_TABLET | Freq: Every day | ORAL | Status: DC

## 2018-05-06 MED ORDER — CARVEDILOL 12.5 MG PO TABS
12.5000 mg | ORAL_TABLET | Freq: Two times a day (BID) | ORAL | Status: DC
  Administered 2018-05-07 – 2018-05-10 (×7): 12.5 mg via ORAL
  Filled 2018-05-06 (×8): qty 1

## 2018-05-06 MED ORDER — IPRATROPIUM-ALBUTEROL 0.5-2.5 (3) MG/3ML IN SOLN
3.0000 mL | RESPIRATORY_TRACT | Status: DC
  Administered 2018-05-06: 3 mL via RESPIRATORY_TRACT
  Filled 2018-05-06: qty 3

## 2018-05-06 MED ORDER — AMIODARONE HCL IN DEXTROSE 360-4.14 MG/200ML-% IV SOLN
60.0000 mg/h | INTRAVENOUS | Status: AC
  Administered 2018-05-06 (×2): 60 mg/h via INTRAVENOUS
  Filled 2018-05-06 (×2): qty 200

## 2018-05-06 MED ORDER — FUROSEMIDE 10 MG/ML IJ SOLN
80.0000 mg | Freq: Two times a day (BID) | INTRAMUSCULAR | Status: DC
  Administered 2018-05-06 – 2018-05-08 (×4): 80 mg via INTRAVENOUS
  Filled 2018-05-06 (×4): qty 8

## 2018-05-06 MED ORDER — NITROGLYCERIN IN D5W 200-5 MCG/ML-% IV SOLN
0.0000 ug/min | INTRAVENOUS | Status: DC
  Administered 2018-05-06: 5 ug/min via INTRAVENOUS
  Filled 2018-05-06: qty 250

## 2018-05-06 MED ORDER — ONDANSETRON HCL 4 MG/2ML IJ SOLN
4.0000 mg | Freq: Three times a day (TID) | INTRAMUSCULAR | Status: DC | PRN
  Administered 2018-05-06: 4 mg via INTRAVENOUS
  Filled 2018-05-06: qty 2

## 2018-05-06 MED ORDER — INSULIN ASPART 100 UNIT/ML ~~LOC~~ SOLN
0.0000 [IU] | Freq: Three times a day (TID) | SUBCUTANEOUS | Status: DC
  Administered 2018-05-07: 2 [IU] via SUBCUTANEOUS
  Administered 2018-05-08: 1 [IU] via SUBCUTANEOUS
  Administered 2018-05-08: 2 [IU] via SUBCUTANEOUS
  Administered 2018-05-08: 1 [IU] via SUBCUTANEOUS
  Administered 2018-05-09: 3 [IU] via SUBCUTANEOUS
  Administered 2018-05-09 – 2018-05-10 (×3): 2 [IU] via SUBCUTANEOUS

## 2018-05-06 MED ORDER — VITAMIN D 1000 UNITS PO TABS
1000.0000 [IU] | ORAL_TABLET | Freq: Every day | ORAL | Status: DC
  Administered 2018-05-07 – 2018-05-10 (×4): 1000 [IU] via ORAL
  Filled 2018-05-06 (×5): qty 1

## 2018-05-06 MED ORDER — DILTIAZEM HCL-DEXTROSE 100-5 MG/100ML-% IV SOLN (PREMIX)
5.0000 mg/h | INTRAVENOUS | Status: DC
  Administered 2018-05-06: 5 mg/h via INTRAVENOUS
  Filled 2018-05-06: qty 100

## 2018-05-06 MED ORDER — CARVEDILOL 12.5 MG PO TABS: 6.2500 mg | ORAL_TABLET | Freq: Two times a day (BID) | ORAL | Status: DC

## 2018-05-06 NOTE — ED Notes (Signed)
Care handoff to [place] , [place]

## 2018-05-06 NOTE — ED Notes (Signed)
Paged cards to update on heart rate

## 2018-05-06 NOTE — Progress Notes (Signed)
Contacted by ER staff about patient with afib with RVR, rates to 150s, soft bp's with limitited ability to titrate dilt gtt. Will start amiodarone drip., d/c IV dilt due poor response, soft bp's, and low LVEF.    Carlyle Dolly MD

## 2018-05-06 NOTE — ED Triage Notes (Signed)
Pt arrives by pov with daughter for acute asthma excerebration with wheezing and tightness in his chest x 2 days.

## 2018-05-06 NOTE — H&P (Signed)
History and Physical    Dustin Leonard:[id number] DOB: September 16, 1929 DOA: 05/06/2018  PCP: Katherina Mires, MD Patient coming from: home  Chief Complaint: sob  HPI: Dustin Leonard. is a 84 y.o. male with medical history significant for COPD not on home oxygen, CVA 2012, cardiomyopathy known left bundle branch block, asthma, tobacco use, prostate cancer, diabetes, paroxysmal A. Fib, heart failure  emergency department with the chief complaint worsening shortness of breath. Initial evaluation reveals acute respiratory failure likely with multiple etiologies specifically acute on chronic CHF, COPD exacerbation and A. Fib with RVR. [facility] are asked to admit  Information is obtained from the patient and his wife is at the bedside and the chart. Patient states he's been as usual state of health until 2-3 days ago he developed "a cold". He reports head congestion postnasal drip intermittent nonproductive cough. Wife also reports inaudible wheeze intermittently. He denies fever chills nausea vomiting. Associated symptoms do include worsening lower extremity edema and orthopnea. Wife states he had to "get out of the bed and sit up to breathe". He denies chest pain palpitations. He denies abdominal pain nausea vomiting diarrhea constipation melena bright red blood per rectum. He denies dysuria hematuria frequency or urgency.  ED Course: emergency department is afebrile blood pressure in the low end of normal EKG reveals A. Fib with RVR at a rate of 150 oxygen saturation level 87% on room air. He is provided with nebulizers, IV Lasix 40 mg, nitroglycerin drip and BiPAP. At the time of admission he is on nasal cannula without increased work of breathing and his [name] saturation levels greater than 90% on 3 L.  Review of Systems: As per HPI otherwise all other systems reviewed and are negative.   Ambulatory Status: ambulates independently is legally blind so needs assist with ADLs  Past  Medical History:  Diagnosis Date  . Asthma   . Cardiomyopathy ([place])    a. 2D echo 2012: EF 30-35%, diffuse HK, grade 1 DD, trivial pericardial effusion.  Marland Kitchen COPD (chronic obstructive pulmonary disease) ([place])   . Diabetes mellitus type 2 in nonobese (HCC)   . Former tobacco use   . LBBB (left bundle branch block)   . Prostate cancer ([place])   . Stroke [facility])     Past Surgical History:  Procedure Laterality Date  . CARDIOVERSION N/A 05/11/2017   Procedure: CARDIOVERSION;  Surgeon: Acie Fredrickson Wonda Cheng, MD;  Location: [facility];  Service: Cardiovascular;  Laterality: N/A;  . PROSTATE SURGERY    . TEE WITHOUT CARDIOVERSION N/A 05/11/2017   Procedure: TRANSESOPHAGEAL ECHOCARDIOGRAM (TEE);  Surgeon: Acie Fredrickson Wonda Cheng, MD;  Location: [facility] ENDOSCOPY;  Service: Cardiovascular;  Laterality: N/A;    Social History   Socioeconomic History  . Marital status: Married    Spouse name: Not on file  . Number of children: Not on file  . Years of education: Not on file  . Highest education level: Not on file  Occupational History  . Occupation: retired  Scientific laboratory technician  . Financial resource strain: Not on file  . Food insecurity:    Worry: Not on file    Inability: Not on file  . Transportation needs:    Medical: Not on file    Non-medical: Not on file  Tobacco Use  . Smoking status: Former Smoker    Types: Cigarettes    Last attempt to quit: 10/23/1957    Years since quitting: 60.5  . Smokeless tobacco: Never Used  . Tobacco comment: Smoked  1/2 ppd x 23 years  Substance and Sexual Activity  . Alcohol use: No  . Drug use: No  . Sexual activity: Not Currently    Partners: Female  Lifestyle  . Physical activity:    Days per week: Not on file    Minutes per session: Not on file  . Stress: Not on file  Relationships  . Social connections:    Talks on phone: Not on file    Gets together: Not on file    Attends religious service: Not on file    Active member of club or organization: Not on file      Attends meetings of clubs or organizations: Not on file    Relationship status: Not on file  . Intimate partner violence:    Fear of current or ex partner: Not on file    Emotionally abused: Not on file    Physically abused: Not on file    Forced sexual activity: Not on file  Other Topics Concern  . Not on file  Social History Narrative   Lives with wife, does ambulate at home without adjuncts, however when going out uses walker, cane.     No Known Allergies  Family History  Problem Relation Age of Onset  . Diabetes Maternal Aunt     Prior to Admission medications   Medication Sig Start Date End Date Taking? Authorizing Provider  aspirin 81 MG chewable tablet Chew 81 mg by mouth daily.   Yes [provider]  carvedilol (COREG) 6.25 MG tablet Take 6.25 mg by mouth 2 (two) times daily. 03/11/18  Yes [provider]  Cholecalciferol 1000 units tablet Take 1,000 Units by mouth daily.   Yes [provider]  clopidogrel (PLAVIX) 75 MG tablet Take 75 mg by mouth daily. 02/27/18  Yes [provider]  doxycycline (VIBRA-TABS) 100 MG tablet Take 100 mg by mouth 2 (two) times daily. Duration 10 days. Started on 04-29-18 04/29/18 05/09/18 Yes [provider]  furosemide (LASIX) 40 MG tablet Take 40 mg by mouth daily.  01/25/13  Yes [provider]  guaifenesin (ROBITUSSIN) 100 MG/5ML syrup Take 200 mg by mouth 3 (three) times daily as needed for cough.   Yes [provider]  hydroxypropyl methylcellulose / hypromellose (ISOPTO TEARS / GONIOVISC) 2.5 % ophthalmic solution Place 1 drop into both eyes as needed for dry eyes.   Yes [provider]  insulin detemir (LEVEMIR) 100 UNIT/ML injection Inject 0.2 mLs (20 Units total) into the skin 2 (two) times daily. Patient taking differently: Inject 27 Units into the skin 2 (two) times daily.  05/15/17  Yes Winfrey, Alcario Drought, MD  ipratropium-albuterol (DUONEB) 0.5-2.5 (3) MG/3ML SOLN Take  3 mLs by nebulization every 6 (six) hours as needed. Patient taking differently: Take 3 mLs by nebulization every 6 (six) hours as needed (Short of breath or wheezing).  07/03/16  Yes Hoffman, Jessica Ratliff, DO  lovastatin (MEVACOR) 40 MG tablet Take 80 mg by mouth at bedtime.  04/18/18  Yes [provider]  mometasone-formoterol (DULERA) 200-5 MCG/ACT AERO Inhale 2 puffs into the lungs 2 (two) times daily. 05/15/17  Yes Winfrey, Alcario Drought, MD  polyethylene glycol (MIRALAX / GLYCOLAX) packet Take 17 g by mouth daily as needed for mild constipation.   Yes [provider]  PROAIR HFA 108 (90 Base) MCG/ACT inhaler Inhale 2 puffs into the lungs every 6 (six) hours as needed for wheezing. 04/30/18  Yes [provider]  amiodarone ([place])  200 MG tablet Take 1 tablet (200 mg total) by mouth daily. Patient not taking: Reported on 05/06/2018 05/15/17   Kathrene Alu, MD  apixaban (ELIQUIS) 5 MG TABS tablet Take 1 tablet (5 mg total) by mouth 2 (two) times daily. Patient not taking: Reported on 05/06/2018 05/15/17   Kathrene Alu, MD  Artificial Tear Ointment (DRY EYES OP) Place 1 drop into the right eye daily as needed (dry eye).    [provider]  levalbuterol (XOPENEX) 1.25 MG/0.5ML nebulizer solution Take 1.25 mg by nebulization every 6 (six) hours as needed for shortness of breath. Patient not taking: Reported on 05/06/2018 05/15/17   Kathrene Alu, MD  metoprolol succinate (TOPROL-XL) 25 MG 24 hr tablet Take 1 tablet (25 mg total) by mouth daily. Patient not taking: Reported on 05/06/2018 05/16/17   Kathrene Alu, MD  ramipril (ALTACE) 2.5 MG capsule Take 1 capsule (2.5 mg total) by mouth daily. Patient not taking: Reported on 05/06/2018 05/16/17   Kathrene Alu, MD    Physical Exam: Vitals:   05/06/18 1415 05/06/18 1430 05/06/18 1500 05/06/18 1555  BP: 97/72 106/80 112/78   Pulse: (!) 41 84 65   Resp: 19 (!) 22 (!) 23   Temp:      TempSrc:        SpO2: 100% 100% 100% 100%     General:  Appears calm and comfortable,  and frail Eyes:  PERRL, EOMI, normal lids, iris ENT:  grossly normal hearing, lips & tongue, mucous membranes of his mouth are pink slightly dry Neck:  no LAD, masses or thyromegaly Cardiovascular:  Irregularly irregular, no m/r/g. 2+ LE edema bilaterally  Respiratory:  Mild increased work of breathing with conversation. Breath sounds diminished but edema here crackles up to mid lobe with a faint end-expiratory wheeze Abdomen:  soft, ntnd, NABS Skin:  no rash or induration seen on limited exam Musculoskeletal:  grossly normal tone BUE/BLE, good ROM, no bony abnormality Psychiatric:  grossly normal mood and affect, speech fluent and appropriate, AOx3 Neurologic:  CN 2-12 grossly intact, moves all extremities in coordinated fashion, sensation intact  Labs on Admission: I have personally reviewed following labs and imaging studies  CBC: Recent Labs  Lab 05/06/18 1248  WBC 9.1  HGB 12.9*  HCT 41.4  MCV 93.2  PLT 811*   Basic Metabolic Panel: Recent Labs  Lab 05/06/18 1248  NA 140  K 4.4  CL 104  CO2 27  GLUCOSE 195*  BUN 36*  CREATININE 1.65*  CALCIUM 8.7*   GFR: CrCl cannot be calculated (Unknown ideal weight.). Liver Function Tests: No results for input(s): AST, ALT, ALKPHOS, BILITOT, PROT, ALBUMIN in the last 168 hours. No results for input(s): LIPASE, AMYLASE in the last 168 hours. No results for input(s): AMMONIA in the last 168 hours. Coagulation Profile: No results for input(s): INR, PROTIME in the last 168 hours. Cardiac Enzymes: Recent Labs  Lab 05/06/18 1248  TROPONINI <0.03   BNP (last 3 results) No results for input(s): PROBNP in the last 8760 hours. HbA1C: No results for input(s): HGBA1C in the last 72 hours. CBG: No results for input(s): GLUCAP in the last 168 hours. Lipid Profile: No results for input(s): CHOL, HDL, LDLCALC, TRIG, CHOLHDL, LDLDIRECT in the last 72  hours. Thyroid Function Tests: No results for input(s): TSH, T4TOTAL, FREET4, T3FREE, THYROIDAB in the last 72 hours. Anemia Panel: No results for input(s): VITAMINB12, FOLATE, FERRITIN, TIBC, IRON, RETICCTPCT in the last 72 hours. Urine  analysis:    Component Value Date/Time   COLORURINE YELLOW 06/30/2016 1416   APPEARANCEUR TURBID (A) 06/30/2016 1416   LABSPEC 1.024 06/30/2016 1416   PHURINE 5.0 06/30/2016 1416   GLUCOSEU 500 (A) 06/30/2016 1416   HGBUR MODERATE (A) 06/30/2016 1416   BILIRUBINUR NEGATIVE 06/30/2016 1416   KETONESUR NEGATIVE 06/30/2016 1416   PROTEINUR 100 (A) 06/30/2016 1416   UROBILINOGEN 1.0 03/26/2014 2102   NITRITE NEGATIVE 06/30/2016 1416   LEUKOCYTESUR LARGE (A) 06/30/2016 1416    Creatinine Clearance: CrCl cannot be calculated (Unknown ideal weight.).  Sepsis Labs: _0 (procalcitonin:4,lacticidven:4) )No results found for this or any previous visit (from the past 240 hour(s)).   Radiological Exams on Admission: Dg Chest 2 View  Result Date: 05/06/2018 CLINICAL DATA:  Chest pain, shortness of breath, dry cough EXAM: CHEST - 2 VIEW COMPARISON:  05/09/2017 FINDINGS: Cardiomegaly. Increased markings in the lung bases could reflect atelectasis or scarring. No effusions. No acute bony abnormality. IMPRESSION: Cardiomegaly.  Bibasilar atelectasis or scarring. Electronically Signed   By: Rolm Baptise M.D.   On: 05/06/2018 10:48    EKG: Independently reviewed. Atrial fibrillation with rapid ventricular response with premature ventricular or aberrantly conducted complexes Right axis deviation Non-specific intra-ventricular conduction delay ST & T wave abnormality,  Assessment/Plan Principal Problem:   Acute respiratory failure (HCC) Active Problems:   Cardiomyopathy, ischemic   Atrial flutter with rapid ventricular response (HCC)   Acute exacerbation of chronic obstructive pulmonary disease (COPD) (HCC)   Acute on chronic combined systolic and  diastolic CHF (congestive heart failure) ([place])   Poorly controlled type 2 diabetes mellitus with renal complication (HCC)   Chronic kidney disease   #1. Acute respiratory failure with hypoxia. Likely multifactorial specificly acute on chronic heart failure in the setting of a COPD exacerbation leading to A. Fib with RVR. Patient initially on BiPAP in the emergency department. He is provided with a nitroglycerin drip IV Lasix and weaned off to 3 L. The time of admission he has no increased work of breathing and oxygen saturation level greater than 90% on 3 L. Chest x-ray reveals cardiomegaly by basilar atelectasis or scarring. BNP is a 81 EKG with atrial fibrillation and rapid ventricular response with PVCs. -admit -Continue oxygen supplementation -Monitor oxygen saturation level -Continue IV Lasix -Scheduled nebulizers -Continue Cardizem drip -resume home Coreg -Wean Cardizem drip as able -Cardiology consult  2. Acute on chronic combined systolic and diastolic heart failure. Echo a year ago reveals an EF of 40-45%.medications include Coreg, Lasix, amiodarone in the past.  -obtain a 2-D echo -Lasix as noted above -obtain echo -continue nitroglycerin drip -Continue home Coreg -Defer further management to cardiology  3. COPD with exacerbation. Patient reports a "cold" as the trigger. He's not on home medications. He does have nebulizers and inhalers. Chest x-ray as noted above. -See #1 -Scheduled meds -Dose steroids as of yet -Monitor oxygen saturation level -We'll resume home regimen when appropriate  #4. Atrial fibrillation with rapid ventricular response. Patient has a history of same. EKG showed A. Fib with RVR. Chest x-ray showed cardiomegaly with by basilar atelectasis that no pulmonary edema. provided Cardizem drip. At time of admission heart rate remained in the 150s -resume home beta blocker -Continue Cardizem drip -defer further management to cardiology  #5. Diabetes type 2.  Serum glucose 195. -Obtain hemoglobin A1c -Sliding scale insulin for optimal control  #6. Chronic kidney disease stage III. Creatinine 1.65. This appears to be at the high end of his range. -Hold nephrotoxins  as able -History urine output -Be met in the morning     DVT prophylaxis: eliquis  Code Status: limited  Family Communication: wife at bedside  Disposition Plan: tbd  Consults called: cardiology per edp, palliative care  Admission status: inpatient    Radene Gunning MD [facility]  If 7PM-7AM, please contact night-coverage [email or web address] Password [facility]  05/06/2018, 4:22 PM

## 2018-05-06 NOTE — Progress Notes (Signed)
Pt taken off bipap and placed on 3L [place]. Pt denies SOB, no increased WOB, VS within normal limits. RT will continue to monitor.

## 2018-05-06 NOTE — Consult Note (Addendum)
Cardiology Admission History and Physical:   Patient ID: Dustin Leonard.; MRN: [id number]; DOB: September 14, 1929   Admission date: 05/06/2018  Primary Care Provider: Katherina Mires, MD Primary Cardiologist: Pixie Casino, MD  Primary Electrophysiologist:  NA  Chief Complaint:  SOB  Patient Profile:   Dustin Leonard. is a 84 y.o. male with a history of hx of COPD, CVA 2012, and 2 prior strokes total), PA flutter  Cardiomyopathy dx at time of CVA, (no further work up) known LBBB, asthma, 20-30 yrs of tobacco use, prostate CA, DM and now presents in a fib with RVR.   Pt being seen today for the evaluation of acute systolic HF and a fib with RVR at the request of Dr. Tyrone Nine   History of Present Illness:   Mr. Dustin Leonard had cardiomyopathy in 2012, was seen by Dr. Terrence Dupont EF 30-35%, diffuse HK, G1DD, trivial pericardial effusion.  Has seen Sr. Spruill and Dr. Doylene Canard but no follow up.  In 2018 he was hospitalized for SOB with increased wheezing and found to be in a flutter with RVR.  Was in HF and amiodarone was added.  Had TEE DCCV 05/11/17 which was successful.  EF was 30-35%. [place] atheroma on aorta by TEE  Pt discharged on Eliquis, altace, toprol XL    He is seen by [facility] and is no longer on Eliquis, he is on Coreg 6.25. Mg BID, lasix 40 daily, plavix, ASA,  Between 05/18/17 and 8/17/198 his eliquis was stopped.  No record as to why. Amiodarone was stopped as well PCP stopped per wife.   Today pt presents with SOB pt thought acute asthma exacerbation with wheezing and tightness in his chest.     [name] 140, K+ 4.4, BUN 36, Cr 1.65  BNP 881 Troponin < 0.03 Hgb 12.9, Plts 129   CXR 2 View Cardiomegaly.  Bibasilar atelectasis or scarring  EKG a fib with RVR and LBBB -rate at 130.    Currently resp 22, HR 135, BP 112/78  He has had albuterol, lasix 40 IV and dilt drip Also IV NTG- he tells me he has not voided.  He is not aware that his heart is rapid.  No chest pain.     Past Medical  History:  Diagnosis Date  . Asthma   . Cardiomyopathy ([place])    a. 2D echo 2012: EF 30-35%, diffuse HK, grade 1 DD, trivial pericardial effusion.  Marland Kitchen COPD (chronic obstructive pulmonary disease) ([place])   . Diabetes mellitus type 2 in nonobese (HCC)   . Former tobacco use   . LBBB (left bundle branch block)   . Prostate cancer ([place])   . Stroke [facility])     Past Surgical History:  Procedure Laterality Date  . CARDIOVERSION N/A 05/11/2017   Procedure: CARDIOVERSION;  Surgeon: Acie Fredrickson Wonda Cheng, MD;  Location: [place];  Service: Cardiovascular;  Laterality: N/A;  . PROSTATE SURGERY    . TEE WITHOUT CARDIOVERSION N/A 05/11/2017   Procedure: TRANSESOPHAGEAL ECHOCARDIOGRAM (TEE);  Surgeon: Acie Fredrickson Wonda Cheng, MD;  Location: [facility] ENDOSCOPY;  Service: Cardiovascular;  Laterality: N/A;     Medications Prior to Admission: Prior to Admission medications   Medication Sig Start Date End Date Taking? Authorizing Provider  aspirin 81 MG chewable tablet Chew 81 mg by mouth daily.   Yes [provider]  carvedilol (COREG) 6.25 MG tablet Take 6.25 mg by mouth 2 (two) times daily. 03/11/18  Yes [provider]  Cholecalciferol 1000 units tablet Take 1,000 Units  by mouth daily.   Yes [provider]  clopidogrel (PLAVIX) 75 MG tablet Take 75 mg by mouth daily. 02/27/18  Yes [provider]  doxycycline (VIBRA-TABS) 100 MG tablet Take 100 mg by mouth 2 (two) times daily. Duration 10 days. Started on 04-29-18 04/29/18 05/09/18 Yes [provider]  furosemide (LASIX) 40 MG tablet Take 40 mg by mouth daily.  01/25/13  Yes [provider]  guaifenesin (ROBITUSSIN) 100 MG/5ML syrup Take 200 mg by mouth 3 (three) times daily as needed for cough.   Yes [provider]  hydroxypropyl methylcellulose / hypromellose (ISOPTO TEARS / GONIOVISC) 2.5 % ophthalmic solution Place 1 drop into both eyes as needed for dry eyes.   Yes [provider]  insulin detemir  (LEVEMIR) 100 UNIT/ML injection Inject 0.2 mLs (20 Units total) into the skin 2 (two) times daily. Patient taking differently: Inject 27 Units into the skin 2 (two) times daily.  05/15/17  Yes Winfrey, Alcario Drought, MD  ipratropium-albuterol (DUONEB) 0.5-2.5 (3) MG/3ML SOLN Take 3 mLs by nebulization every 6 (six) hours as needed. Patient taking differently: Take 3 mLs by nebulization every 6 (six) hours as needed (Short of breath or wheezing).  07/03/16  Yes Hoffman, Jessica Ratliff, DO  lovastatin (MEVACOR) 40 MG tablet Take 80 mg by mouth at bedtime.  04/18/18  Yes [provider]  mometasone-formoterol (DULERA) 200-5 MCG/ACT AERO Inhale 2 puffs into the lungs 2 (two) times daily. 05/15/17  Yes Winfrey, Alcario Drought, MD  polyethylene glycol (MIRALAX / GLYCOLAX) packet Take 17 g by mouth daily as needed for mild constipation.   Yes [provider]  PROAIR HFA 108 (90 Base) MCG/ACT inhaler Inhale 2 puffs into the lungs every 6 (six) hours as needed for wheezing. 04/30/18  Yes [provider]  amiodarone (PACERONE) 200 MG tablet Take 1 tablet (200 mg total) by mouth daily. Patient not taking: Reported on 05/06/2018 05/15/17   Kathrene Alu, MD  apixaban (ELIQUIS) 5 MG TABS tablet Take 1 tablet (5 mg total) by mouth 2 (two) times daily. Patient not taking: Reported on 05/06/2018 05/15/17   Kathrene Alu, MD  Artificial Tear Ointment (DRY EYES OP) Place 1 drop into the right eye daily as needed (dry eye).    [provider]  levalbuterol (XOPENEX) 1.25 MG/0.5ML nebulizer solution Take 1.25 mg by nebulization every 6 (six) hours as needed for shortness of breath. Patient not taking: Reported on 05/06/2018 05/15/17   Kathrene Alu, MD  metoprolol succinate (TOPROL-XL) 25 MG 24 hr tablet Take 1 tablet (25 mg total) by mouth daily. Patient not taking: Reported on 05/06/2018 05/16/17   Kathrene Alu, MD  ramipril (ALTACE) 2.5 MG capsule Take 1 capsule (2.5 mg total) by mouth  daily. Patient not taking: Reported on 05/06/2018 05/16/17   Kathrene Alu, MD   Is not on amiodarone or toprol or eliquis   Allergies:   No Known Allergies  Social History:   Social History   Socioeconomic History  . Marital status: Married    Spouse name: Not on file  . Number of children: Not on file  . Years of education: Not on file  . Highest education level: Not on file  Occupational History  . Occupation: retired  Scientific laboratory technician  . Financial resource strain: Not on file  . Food insecurity:    Worry: Not on file    Inability: Not on file  . Transportation needs:    Medical:  Not on file    Non-medical: Not on file  Tobacco Use  . Smoking status: Former Smoker    Types: Cigarettes    Last attempt to quit: 10/23/1957    Years since quitting: 60.5  . Smokeless tobacco: Never Used  . Tobacco comment: Smoked 1/2 ppd x 23 years  Substance and Sexual Activity  . Alcohol use: No  . Drug use: No  . Sexual activity: Not Currently    Partners: Female  Lifestyle  . Physical activity:    Days per week: Not on file    Minutes per session: Not on file  . Stress: Not on file  Relationships  . Social connections:    Talks on phone: Not on file    Gets together: Not on file    Attends religious service: Not on file    Active member of club or organization: Not on file    Attends meetings of clubs or organizations: Not on file    Relationship status: Not on file  . Intimate partner violence:    Fear of current or ex partner: Not on file    Emotionally abused: Not on file    Physically abused: Not on file    Forced sexual activity: Not on file  Other Topics Concern  . Not on file  Social History Narrative   Lives with wife, does ambulate at home without adjuncts, however when going out uses walker, cane.     Family History:  The patient's family history includes Diabetes in his maternal aunt.    ROS:  Please see the history of present illness.  General:no colds or  fevers, no weight changes Skin:no rashes or ulcers HEENT:no blurred vision, no congestion CV:see HPI PUL:see HPI GI:no diarrhea constipation or melena, no indigestion GU:no hematuria, no dysuria MS:no joint pain, no claudication Neuro:no syncope, no lightheadedness Endo:+ diabetes, no thyroid disease .     Physical Exam/Data:   Vitals:   05/06/18 1415 05/06/18 1430 05/06/18 1500 05/06/18 1555  BP: 97/72 106/80 112/78   Pulse: (!) 41 84 65   Resp: 19 (!) 22 (!) 23   Temp:      TempSrc:      SpO2: 100% 100% 100% 100%   No intake or output data in the 24 hours ending 05/06/18 1604 There were no vitals filed for this visit. There is no height or weight on file to calculate BMI.  General:  Well nourished, well developed, in no acute distress now off bipap HEENT: normal- though hard of hearing. Lymph: no adenopathy Neck: no to mild JVD Endocrine:  No thryomegaly Vascular: No carotid bruits; pedal pulses 2+ bilaterally  Cardiac: rapid and irregular; no murmur, gallup rub or click  Lungs:  Tight to auscultation bilaterally, + wheezing, +rhonchi + rales  Abd: soft, nontender, no hepatomegaly his wife feels more fluid in abd. Ext: 3+ lower ext edema to above his knees. Musculoskeletal:  No deformities, BUE and BLE strength normal and equal Skin: warm and dry  Neuro:  Alert and oriented follows commands, no focal abnormalities noted Psych:  Normal affect     Relevant CV Studies: Echo 05/10/17  Study Conclusions  - Left ventricle: The cavity size was normal. Wall thickness was   increased in a pattern of mild LVH. Systolic function was   moderately to severely reduced. The estimated ejection fraction   was in the range of 30% to 35%. Dyskinesis of the anteroseptal   myocardium and akinesis of the inferior  and inferoseptum. Doppler   parameters are consistent with high ventricular filling pressure. - Aortic valve: Transvalvular velocity was within the normal range.   There  was no stenosis. There was trivial regurgitation. - Mitral valve: Mildly calcified annulus. Transvalvular velocity   was within the normal range. There was no evidence for stenosis.   There was mild regurgitation. - Left atrium: The atrium was moderately dilated. - Right ventricle: The cavity size was normal. Wall thickness was   normal. Systolic function was normal. - Pulmonary arteries: Systolic pressure was mildly increased. PA   peak pressure: 46 mm Hg (S).   TEE 05/11/17  Study Conclusions  - Left ventricle: Systolic function was mildly to moderately   reduced. The estimated ejection fraction was in the range of 40%   to 45%. - Aortic valve: No evidence of vegetation. - Mitral valve: No evidence of vegetation. There was mild to   moderate regurgitation. - Left atrium: No evidence of thrombus in the atrial cavity or   appendage. - Tricuspid valve: No evidence of vegetation.  Impressions:  - The patient was successfully cardioverted after the procedure   Laboratory Data:  Chemistry Recent Labs  Lab 05/06/18 1248  NA 140  K 4.4  CL 104  CO2 27  GLUCOSE 195*  BUN 36*  CREATININE 1.65*  CALCIUM 8.7*  GFRNONAA 36*  GFRAA 41*  ANIONGAP 9    No results for input(s): PROT, ALBUMIN, AST, ALT, ALKPHOS, BILITOT in the last 168 hours. Hematology Recent Labs  Lab 05/06/18 1248  WBC 9.1  RBC 4.44  HGB 12.9*  HCT 41.4  MCV 93.2  MCH 29.1  MCHC 31.2  RDW 13.6  PLT 129*   Cardiac Enzymes Recent Labs  Lab 05/06/18 1248  TROPONINI <0.03   No results for input(s): TROPIPOC in the last 168 hours.  BNP Recent Labs  Lab 05/06/18 1248  BNP 881.6*    DDimer No results for input(s): DDIMER in the last 168 hours.  Radiology/Studies:  Dg Chest 2 View  Result Date: 05/06/2018 CLINICAL DATA:  Chest pain, shortness of breath, dry cough EXAM: CHEST - 2 VIEW COMPARISON:  05/09/2017 FINDINGS: Cardiomegaly. Increased markings in the lung bases could reflect  atelectasis or scarring. No effusions. No acute bony abnormality. IMPRESSION: Cardiomegaly.  Bibasilar atelectasis or scarring. Electronically Signed   By: Rolm Baptise M.D.   On: 05/06/2018 10:48    Assessment and Plan:   1. Acute respiratory failure on BiPAP -now off and resting comfortable no I&O noted  Dr. Debara Pickett to see.  2.  3. Acute systolic HF, with CM EF 58-09% last year. Increase lasix to 80 mg BID with SOB and lower ext edema, BNP 800  Echo has been ordered. Increase coreg tonight to 12.5 BID stop NTG, to improve BP and increase IV lasix. 4. Asthma/COPD this may be playing a role as well continue nebs. Per IM  5.         DM SSI per IM   6.         A fib with RVR on IV dilt at 12.5 - ? Change to amiodarone? - had difficulty slowing HR on last episode.  Place back on eliquis .  Though I do not know how long pt has been in a fib.   7.         CKD 3 Cr 1.65   Monitor.        For questions or updates, please contact [place] Please  consult [email or web address] for contact info under Cardiology/STEMI.    Signed, Cecilie Kicks, NP  05/06/2018 4:04 PM

## 2018-05-06 NOTE — ED Notes (Signed)
Placed pt on 2L of O2 

## 2018-05-06 NOTE — Progress Notes (Signed)
ANTICOAGULATION CONSULT NOTE - Initial Consult  Pharmacy Consult for Eliquis Indication: atrial fibrillation   Vital Signs: Temp: 98.1 F (36.7 C) (07/15 1000) Temp Source: Oral (07/15 1000) BP: 92/78 (07/15 1730) Pulse Rate: 107 (07/15 1730)  Labs: Recent Labs    05/06/18 1248  HGB 12.9*  HCT 41.4  PLT 129*  CREATININE 1.65*  TROPONINI <0.03    Medical History: Past Medical History:  Diagnosis Date  . Asthma   . Cardiomyopathy ([place])    a. 2D echo 2012: EF 30-35%, diffuse HK, grade 1 DD, trivial pericardial effusion.  Marland Kitchen COPD (chronic obstructive pulmonary disease) ([place])   . Diabetes mellitus type 2 in nonobese (HCC)   . Former tobacco use   . LBBB (left bundle branch block)   . Prostate cancer ([place])   . Stroke [facility]      Assessment: 84 yo male admitted with SOB and chest tightness. He has known AFib, he was previously on Eliquis and amiodarone. Both of these were self-discontinued for unknown reasons. Pt is back in AFib with RVR - planning to re-initiate Eliquis. The patient will need the lower Eliquis dose given his age and Scr. His SCr appears to fluctuate from 1.4 to 2.2 and is 1.6 today. He also has known thrombocytopenia at baseline.    Goal of Therapy:  Monitor platelets by anticoagulation protocol: Yes    Plan:  -Eliquis 2.5 mg po bid -Monitor renal fx -Monitor for bleeding with mild thrombocytopenia   Harvel Quale 05/06/2018,5:38 PM

## 2018-05-06 NOTE — ED Notes (Signed)
Dr Lorin Mercy paged, informed of BP and occasional PVCs

## 2018-05-06 NOTE — ED Provider Notes (Signed)
[facility] EMERGENCY DEPARTMENT Provider Note   CSN: [id number] Arrival date & time: 05/06/18  5701   History   Chief Complaint Chief Complaint  Patient presents with  . Asthma    HPI Dustin Leonard. is a 84 y.o. male with a medical history of asthma, COPD, CVA, prostate cancer, DMII, afib/aflutter on eliquis, and systolic HF who presents with 2-3 days of dyspnea, wheezing, orthopnea, and nocturnal paroxysmal dyspnea. He was seen by his PCP on 7/8 for milder symptoms of shortness of breath and was thought to have an overlap of COPD exacerbation and CHF exacerbation. At that time he was restarted on lasix and given a course of prednisone and doxycycline. He felt improved for a while, but 2-3 days ago had resurgence and worsening of his symptoms. He has been using his inhalers at home which bring him some relief. He endorses cough and leg swelling, but denies sputum production, fevers, chills, chest pain, abdominal pain, or dysuria.   Past Medical History:  Diagnosis Date  . Asthma   . Cardiomyopathy ([place])    a. 2D echo 2012: EF 30-35%, diffuse HK, grade 1 DD, trivial pericardial effusion.  Marland Kitchen COPD (chronic obstructive pulmonary disease) ([place])   . Diabetes mellitus type 2 in nonobese (HCC)   . Former tobacco use   . LBBB (left bundle branch block)   . Prostate cancer ([place])   . Stroke [facility])     Patient Active Problem List   Diagnosis Date Noted  . Acute respiratory failure ([place]) 05/06/2018  . Chronic kidney disease 05/06/2018  . Poorly controlled type 2 diabetes mellitus with renal complication ([place])   . Acute exacerbation of chronic obstructive pulmonary disease (COPD) ([place]) 05/10/2017  . Cardiomyopathy ([place]) 05/10/2017  . Acute on chronic combined systolic and diastolic CHF (congestive heart failure) ([place]) 05/10/2017  . Acute kidney injury superimposed on chronic kidney disease ([place]) 05/10/2017  . Demand ischemia ([place]) 05/10/2017  . Wheezing 05/10/2017  .  LBBB (left bundle branch block) 05/10/2017  . Asthma 05/10/2017  . Atrial flutter with rapid ventricular response ([place]) 05/09/2017  . COPD exacerbation ([place]) 06/30/2016  . History of TIA (transient ischemic attack) 06/30/2016  . UTI (lower urinary tract infection) 06/30/2016  . Type 2 diabetes mellitus with hyperglycemia, with long-term current use of insulin ([place]) 06/30/2016  . History of stroke 09/22/2014  . Cardiomyopathy, ischemic 04/13/2013  . GLAUCOMA 12/02/2007  . COPD with emphysema ([place]) 12/02/2007  . PROSTATE CANCER, HX OF 12/02/2007    Past Surgical History:  Procedure Laterality Date  . CARDIOVERSION N/A 05/11/2017   Procedure: CARDIOVERSION;  Surgeon: Acie Fredrickson Wonda Cheng, MD;  Location: [facility];  Service: Cardiovascular;  Laterality: N/A;  . PROSTATE SURGERY    . TEE WITHOUT CARDIOVERSION N/A 05/11/2017   Procedure: TRANSESOPHAGEAL ECHOCARDIOGRAM (TEE);  Surgeon: Acie Fredrickson Wonda Cheng, MD;  Location: [facility] ENDOSCOPY;  Service: Cardiovascular;  Laterality: N/A;        Home Medications    Prior to Admission medications   Medication Sig Start Date End Date Taking? Authorizing Provider  aspirin 81 MG chewable tablet Chew 81 mg by mouth daily.   Yes [provider]  carvedilol (COREG) 6.25 MG tablet Take 6.25 mg by mouth 2 (two) times daily. 03/11/18  Yes [provider]  Cholecalciferol 1000 units tablet Take 1,000 Units by mouth daily.   Yes [provider]  clopidogrel (PLAVIX) 75 MG tablet Take 75 mg by mouth daily. 02/27/18  Yes [provider]  doxycycline (VIBRA-TABS) 100 MG tablet Take 100 mg by mouth 2 (two) times daily. Duration 10 days. Started on 04-29-18 04/29/18 05/09/18 Yes [provider]  furosemide (LASIX) 40 MG tablet Take 40 mg by mouth daily.  01/25/13  Yes [provider]  guaifenesin (ROBITUSSIN) 100 MG/5ML syrup Take 200 mg by mouth 3 (three) times daily as needed for cough.   Yes [provider]    hydroxypropyl methylcellulose / hypromellose (ISOPTO TEARS / GONIOVISC) 2.5 % ophthalmic solution Place 1 drop into both eyes as needed for dry eyes.   Yes [provider]  insulin detemir (LEVEMIR) 100 UNIT/ML injection Inject 0.2 mLs (20 Units total) into the skin 2 (two) times daily. Patient taking differently: Inject 27 Units into the skin 2 (two) times daily.  05/15/17  Yes Winfrey, Alcario Drought, MD  ipratropium-albuterol (DUONEB) 0.5-2.5 (3) MG/3ML SOLN Take 3 mLs by nebulization every 6 (six) hours as needed. Patient taking differently: Take 3 mLs by nebulization every 6 (six) hours as needed (Short of breath or wheezing).  07/03/16  Yes Hoffman, Jessica Ratliff, DO  lovastatin (MEVACOR) 40 MG tablet Take 80 mg by mouth at bedtime.  04/18/18  Yes [provider]  mometasone-formoterol (DULERA) 200-5 MCG/ACT AERO Inhale 2 puffs into the lungs 2 (two) times daily. 05/15/17  Yes Winfrey, Alcario Drought, MD  polyethylene glycol (MIRALAX / GLYCOLAX) packet Take 17 g by mouth daily as needed for mild constipation.   Yes [provider]  PROAIR HFA 108 (90 Base) MCG/ACT inhaler Inhale 2 puffs into the lungs every 6 (six) hours as needed for wheezing. 04/30/18  Yes [provider]  amiodarone (PACERONE) 200 MG tablet Take 1 tablet (200 mg total) by mouth daily. Patient not taking: Reported on 05/06/2018 05/15/17   Kathrene Alu, MD  apixaban (ELIQUIS) 5 MG TABS tablet Take 1 tablet (5 mg total) by mouth 2 (two) times daily. Patient not taking: Reported on 05/06/2018 05/15/17   Kathrene Alu, MD  Artificial Tear Ointment (DRY EYES OP) Place 1 drop into the right eye daily as needed (dry eye).    [provider]  levalbuterol (XOPENEX) 1.25 MG/0.5ML nebulizer solution Take 1.25 mg by nebulization every 6 (six) hours as needed for shortness of breath. Patient not taking: Reported on 05/06/2018 05/15/17   Kathrene Alu, MD  metoprolol succinate (TOPROL-XL) 25 MG 24 hr  tablet Take 1 tablet (25 mg total) by mouth daily. Patient not taking: Reported on 05/06/2018 05/16/17   Kathrene Alu, MD  ramipril (ALTACE) 2.5 MG capsule Take 1 capsule (2.5 mg total) by mouth daily. Patient not taking: Reported on 05/06/2018 05/16/17   Kathrene Alu, MD    Family History Family History  Problem Relation Age of Onset  . Diabetes Maternal Aunt     Social History Social History   Tobacco Use  . Smoking status: Former Smoker    Types: Cigarettes    Last attempt to quit: 10/23/1957    Years since quitting: 60.5  . Smokeless tobacco: Never Used  . Tobacco comment: Smoked 1/2 ppd x 23 years  Substance Use Topics  . Alcohol use: No  . Drug use: No     Allergies   Patient has no known allergies.   Review of Systems Review of Systems  Constitutional: Negative for chills and fever.  HENT: Positive for rhinorrhea. Negative for sore throat.   Respiratory: Positive for cough, shortness of breath and wheezing.   Cardiovascular: Positive  for leg swelling. Negative for chest pain.  Gastrointestinal: Negative for abdominal pain, nausea and vomiting.  Genitourinary: Negative for difficulty urinating and dysuria.  Musculoskeletal: Negative for back pain and neck pain.  Skin: Negative for rash and wound.  Neurological: Negative for weakness and headaches.     Physical Exam Updated Vital Signs BP 112/78   Pulse 65   Temp 98.1 F (36.7 C) (Oral)   Resp (!) 23   SpO2 100%   Physical Exam  Constitutional: He appears well-developed and well-nourished. He appears distressed.  HENT:  Head: Normocephalic and atraumatic.  Eyes:  Right eye cataract.   Neck: Normal range of motion. Neck supple. JVD present.  Cardiovascular: Exam reveals no friction rub.  No murmur heard. Irregularly irregular and tachycardic.  Pulmonary/Chest: He is in respiratory distress. He has wheezes.  Bibasilar crackles  Abdominal: Soft. Bowel sounds are normal. He exhibits no  distension. There is no tenderness.  Musculoskeletal:  2+ BLE pitting edema  Neurological: He is alert.  Oriented to person and place, but not time.  Skin: Skin is warm and dry. Capillary refill takes less than 2 seconds. No rash noted.  Psychiatric: He has a normal mood and affect. His behavior is normal.     ED Treatments / Results  Labs (all labs ordered are listed, but only abnormal results are displayed) Labs Reviewed  CBC - Abnormal; Notable for the following components:      Result Value   Hemoglobin 12.9 (*)    Platelets 129 (*)    All other components within normal limits  BASIC METABOLIC PANEL - Abnormal; Notable for the following components:   Glucose, Bld 195 (*)    BUN 36 (*)    Creatinine, Ser 1.65 (*)    Calcium 8.7 (*)    GFR calc non Af Amer 36 (*)    GFR calc Af Amer 41 (*)    All other components within normal limits  BRAIN NATRIURETIC PEPTIDE - Abnormal; Notable for the following components:   B Natriuretic Peptide 881.6 (*)    All other components within normal limits  TROPONIN I    EKG EKG Interpretation  Date/Time:  Monday May 06 2018 10:04:37 EDT Ventricular Rate:  130 PR Interval:    QRS Duration: 124 QT Interval:  306 QTC Calculation: 450 R Axis:   117 Text Interpretation: Poor data quality, interpretation may be adversely affected Atrial fibrillation with rapid ventricular response with premature ventricular or aberrantly conducted complexes Right axis deviation Non-specific intra-ventricular conduction delay ST & T wave abnormality, consider inferior ischemia Abnormal ECG Since last tracing rate faster Confirmed by Deno Etienne [phone]) on 05/06/2018 12:28:40 PM  Radiology Dg Chest 2 View  Result Date: 05/06/2018 CLINICAL DATA:  Chest pain, shortness of breath, dry cough EXAM: CHEST - 2 VIEW COMPARISON:  05/09/2017 FINDINGS: Cardiomegaly. Increased markings in the lung bases could reflect atelectasis or scarring. No effusions. No acute bony  abnormality. IMPRESSION: Cardiomegaly.  Bibasilar atelectasis or scarring. Electronically Signed   By: Rolm Baptise M.D.   On: 05/06/2018 10:48    Procedures Procedures (including critical care time)  Medications Ordered in ED Medications  albuterol (PROVENTIL) (2.5 MG/3ML) 0.083% nebulizer solution (  Not Given 05/06/18 1020)  nitroGLYCERIN 50 mg in dextrose 5 % 250 mL (0.2 mg/mL) infusion (5 mcg/min Intravenous New Bag/Given 05/06/18 1313)  diltiazem (CARDIZEM) 100 mg in dextrose 5% 173mL (1 mg/mL) infusion (10 mg/hr Intravenous Rate/Dose Change 05/06/18 1513)  albuterol (PROVENTIL) (2.5 MG/3ML)  0.083% nebulizer solution 5 mg (5 mg Nebulization Given 05/06/18 1020)  furosemide (LASIX) injection 40 mg (40 mg Intravenous Given 05/06/18 1309)     Initial Impression / Assessment and Plan / ED Course  I have reviewed the triage vital signs and the nursing notes.  Pertinent labs & imaging results that were available during my care of the patient were reviewed by me and considered in my medical decision making (see chart for details).  Shaurya Rawdon. is a 84 y.o. male with a medical history of asthma, COPD, CVA, prostate cancer, DMII, afib/aflutter on eliquis, and systolic HF who presents with 2-3 days of dyspnea, wheezing, orthopnea, and nocturnal paroxysmal dyspnea despite recent lasix, antibiotics, and prednisone therapy. Upon arrival to the ED, pt is afebrile, blood pressure is 96/83, pulse 59, RR 22, and O2 sat 79% on room air. He was placed on 3L oxygen and given an albuterol neb which he reports helped his shortness of breath. After albuterol, patient was noted to be in afib and tachycardic to the 140s. On physical exam, patient has wheezing and crackles, JVD, and 2+ BLE pitting edema. Labs were significant for stable Cr of 1.65, BNP 881, negative trop. EKG showed afib with RVR. CXR showed cardiomegaly with bibasilar atelectasis/scarring, but no pulmonary edema. Based on his physical exam  findings, CHF exacerbation is more likely than COPD exacerbation, although both are likely contributing to his symptoms. Pt was placed on bipap for ongoing respiratory distress, started on a nitroglycerin drip, and given IV lasix. On reassessment, patient felt less short of breath. He remained normotensive, but in afib with RVR. Thus, he was started on a diltiazem drip with little improvement in his heart rate. Patient discussed with cardiology and hospitalist who will admit.   Final Clinical Impressions(s) / ED Diagnoses   Final diagnoses:  Acute on chronic congestive heart failure, unspecified heart failure type ([place])  Leg swelling  Shortness of breath  Atrial fibrillation with rapid ventricular response [facility])    ED Discharge Orders    None       Mikelle Myrick, Andree Elk, MD 05/06/18 [place], Dan, DO 05/06/18 1612

## 2018-05-07 ENCOUNTER — Other Ambulatory Visit (HOSPITAL_COMMUNITY): Payer: Medicare Other

## 2018-05-07 DIAGNOSIS — Z515 Encounter for palliative care: Secondary | ICD-10-CM

## 2018-05-07 LAB — BASIC METABOLIC PANEL
Anion gap: 7 (ref 5–15)
BUN: 36 mg/dL — AB (ref 8–23)
CALCIUM: 8.4 mg/dL — AB (ref 8.9–10.3)
CHLORIDE: 105 mmol/L (ref 98–111)
CO2: 28 mmol/L (ref 22–32)
CREATININE: 1.69 mg/dL — AB (ref 0.61–1.24)
GFR calc Af Amer: 40 mL/min — ABNORMAL LOW (ref >60)
GFR calc non Af Amer: 35 mL/min — ABNORMAL LOW (ref >60)
GLUCOSE: 63 mg/dL — AB (ref 70–99)
Potassium: 4.4 mmol/L (ref 3.5–5.1)
Sodium: 140 mmol/L (ref 135–145)

## 2018-05-07 LAB — GLUCOSE, CAPILLARY
GLUCOSE-CAPILLARY: 120 mg/dL — AB (ref 70–99)
GLUCOSE-CAPILLARY: 217 mg/dL — AB (ref 70–99)
GLUCOSE-CAPILLARY: 48 mg/dL — AB (ref 70–99)
Glucose-Capillary: 94 mg/dL (ref 70–99)
Glucose-Capillary: 180 mg/dL — ABNORMAL HIGH (ref 70–99)

## 2018-05-07 LAB — CBC
HCT: 38.7 % — ABNORMAL LOW (ref 39.0–52.0)
Hemoglobin: 12 g/dL — ABNORMAL LOW (ref 13.0–17.0)
MCH: 28.6 pg (ref 26.0–34.0)
MCHC: 31 g/dL (ref 30.0–36.0)
MCV: 92.1 fL (ref 78.0–100.0)
PLATELETS: 131 10*3/uL — AB (ref 150–400)
RBC: 4.2 MIL/uL — AB (ref 4.22–5.81)
RDW: 13.8 % (ref 11.5–15.5)
WBC: 9.1 10*3/uL (ref 4.0–10.5)

## 2018-05-07 LAB — MRSA PCR SCREENING: MRSA BY PCR: NEGATIVE

## 2018-05-07 LAB — TSH: TSH: 2.026 u[IU]/mL (ref 0.350–4.500)

## 2018-05-07 NOTE — Evaluation (Signed)
Physical Therapy Evaluation Patient Details Name: Dustin Leonard. MRN: [id number] DOB: [birth year] Today's Date: 05/07/2018   History of Present Illness  Dustin Leonard. is a 84 y.o. male with a history of hx of COPD, CVA 2012, and 2 prior strokes total), PA flutter  Cardiomyopathy dx at time of CVA, (no further work up) known LBBB, asthma, 20-30 yrs of tobacco use, prostate CA, DM and now presents in a fib with RVR.   Clinical Impression  Pt admitted with above diagnosis. Pt currently with functional limitations due to the deficits listed below (see PT Problem List). Pt was able to ambulate with min assist in room with RW.  Daughter states she feels her father is close to baseline and will do fine at home when ready.  She wants PT in hospital so that father maintains strength.  Will follow acutely.   Pt will benefit from skilled PT to increase their independence and safety with mobility to allow discharge to the venue listed below.      Follow Up Recommendations No PT follow up;Supervision/Assistance - 24 hour(daughter declines the need for PT)    Equipment Recommendations  None recommended by PT    Recommendations for Other Services       Precautions / Restrictions Precautions Precautions: Fall Restrictions Weight Bearing Restrictions: No      Mobility  Bed Mobility Overal bed mobility: Needs Assistance Bed Mobility: Supine to Sit     Supine to sit: Min assist     General bed mobility comments: Needed a little assist for elevation of trunk  Transfers Overall transfer level: Needs assistance Equipment used: Rolling walker (2 wheeled) Transfers: Sit to/from Stand Sit to Stand: Min assist         General transfer comment: Needed cues for hand placement and a little assist to power up.  Ambulation/Gait Ambulation/Gait assistance: Min assist Gait Distance (Feet): 35 Feet Assistive device: Rolling walker (2 wheeled) Gait Pattern/deviations: Step-through  pattern;Decreased stride length;Trunk flexed;Shuffle   Gait velocity interpretation: <1.31 ft/sec, indicative of household ambulator General Gait Details: Pt needs constant cues and assist to steer RW as well as verbal cues given that he is blind and is in unfamiliar environment.  Pt was able to manage posture and ambulate with RW support in room. Limited distance due to HR still in Afib.  BP 106/73 initially.  103/75 in sitting, 112/76 once in chair.  Stairs            Wheelchair Mobility    Modified Rankin (Stroke Patients Only)       Balance Overall balance assessment: Needs assistance Sitting-balance support: No upper extremity supported;Feet supported Sitting balance-Leahy Scale: Fair     Standing balance support: Bilateral upper extremity supported;During functional activity Standing balance-Leahy Scale: Poor Standing balance comment: relies on RW for support                             Pertinent Vitals/Pain Pain Assessment: No/denies pain    Home Living Family/patient expects to be discharged to:: Private residence Living Arrangements: Spouse/significant other;Children Available Help at Discharge: Family;Available 24 hours/day(wife and daughter) Type of Home: House Home Access: Stairs to enter Entrance Stairs-Rails: Right;Left;Can reach both Entrance Stairs-Number of Steps: 4 Home Layout: Two level;Able to live on main level with bedroom/bathroom;Laundry or work area in [place]: Environmental consultant - 2 wheels;Shower seat;Grab bars - tub/shower;Grab bars - toilet;Cane - quad Additional Comments: 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         Hand  Dominance   Dominant Hand: Right    Extremity/Trunk Assessment   Upper Extremity Assessment Upper Extremity Assessment: Defer to OT evaluation    Lower Extremity Assessment Lower Extremity Assessment: Generalized weakness    Cervical / Trunk Assessment Cervical / Trunk Assessment: Kyphotic  Communication   Communication: HOH  Cognition Arousal/Alertness: Awake/alert Behavior During Therapy: Flat affect Overall Cognitive Status: Within Functional Limits for tasks assessed                                        General Comments      Exercises General Exercises - Lower Extremity Ankle Circles/Pumps: AROM;Both;5 reps;Supine Heel Slides: AROM;Both;10 reps;Supine Other Exercises Other Exercises: discussed exercises wtih daughter and she states she will have pt do them to maintain strength.    Assessment/Plan    PT Assessment Patient needs continued PT services  PT Problem List Decreased activity tolerance;Decreased balance;Decreased mobility;Decreased knowledge of use of DME;Decreased safety awareness;Decreased knowledge of precautions       PT Treatment Interventions DME instruction;Gait training;Functional mobility training;Therapeutic activities;Therapeutic exercise;Balance training;Patient/family education;Stair training    PT Goals (Current goals can be found in the Care Plan section)  Acute Rehab PT Goals Patient Stated Goal: to  go home PT Goal Formulation: With patient Time For Goal Achievement: 05/21/18 Potential to Achieve Goals: Good    Frequency Min 3X/week   Barriers to discharge        Co-evaluation               AM-PAC PT "6 Clicks" Daily Activity  Outcome Measure Difficulty turning over in bed (including adjusting bedclothes, sheets and blankets)?: A Little Difficulty moving from lying on back to sitting on the side of the bed? : Unable Difficulty sitting down on and standing up from a chair with arms (e.g., wheelchair,  bedside commode, etc,.)?: Unable Help needed moving to and from a bed to chair (including a wheelchair)?: A Little Help needed walking in hospital room?: A Little Help needed climbing 3-5 steps with a railing? : Total 6 Click Score: 12    End of Session Equipment Utilized During Treatment: Gait belt Activity Tolerance: Patient limited by fatigue Patient left: in chair;with call bell/phone within reach;with chair alarm set;with family/visitor present Nurse Communication: Mobility status PT Visit Diagnosis: Muscle weakness (generalized) (M62.81)    Time: 8341-9622 PT Time Calculation (min) (ACUTE ONLY): 20 min   Charges:   PT Evaluation $PT Eval Moderate Complexity: 1 Mod     PT G Codes:        Dustin Leonard,PT Acute Rehabilitation [phone] (pager)   Denice Paradise 05/07/2018, 12:07 PM

## 2018-05-07 NOTE — Progress Notes (Signed)
Inpatient Diabetes Program Recommendations  AACE/ADA: New Consensus Statement on Inpatient Glycemic Control (2015)  Target Ranges:  Prepandial:   less than 140 mg/dL      Peak postprandial:   less than 180 mg/dL (1-2 hours)      Critically ill patients:  140 - 180 mg/dL   Lab Results  Component Value Date   GLUCAP 94 05/07/2018   HGBA1C 11.5 (H) 05/10/2017   Review of Glycemic Control  Diabetes history: DM 2 Outpatient Diabetes medications: Levemir 27 units BID Current orders for Inpatient glycemic control: Levemir 20 units qhs, Novolog 0-9 units tid, Novolog 0-5 units qhs  Inpatient Diabetes Program Recommendations:    Patient nauseated all night. Did not get Levemir. Had hypoglycemia in the 40's. Per RN 2 cups of juice given and a Glucerna this am since PO intake is poor right now. Patient's renal function elevated. Please consider d/cing Levemir for now until glucose is consistently above 180. Also Consider a Novolog custom Correction scale starting at 150 mg/dl.  -Custom Novolog correction scale 0-5 units       151-200  1 unit      201-250  2 units      251-300  3 units      301-350  4 units      351-400  5 units  Consider an updated A1c level to determine glucose control over the past 2-3 months.  Thanks,  Tama Headings RN, MSN, BC-ADM, [facility] Inpatient Diabetes Coordinator Team Pager [phone] (8a-5p)

## 2018-05-07 NOTE — Plan of Care (Signed)
  Problem: Pain Managment: Goal: General experience of comfort will improve Outcome: Progressing   

## 2018-05-07 NOTE — Consult Note (Signed)
Consultation Note Date: 05/07/2018   Patient Name: Dustin Leonard.  DOB: 1929/01/27  MRN: [id number]  Age / Sex: 84 y.o., male  PCP: Katherina Mires, MD Referring Physician: Georgette Shell, MD  Reason for Consultation: Establishing goals of care and Psychosocial/spiritual support  HPI/Patient Profile: 84 y.o. male  with past medical history of mixed heart failure, afib (s/p cardioversion in 2018), stage 3 CKD, COPD, CVA, prostate CA, and DM who was admitted on 05/06/2018 with acute respiratory failure secondary to heart failure exacerbation.  He had developed afib with RVR and soft blood pressures.  After cardiology consultation he was re-started on amiodarone and anticoagulation.  Plans are for repeat cardioversion later this week.  Clinical Assessment and Goals of Care:  I have reviewed medical records including EPIC notes, labs and imaging, received report from the care team, assessed the patient and then met at the bedside along with his daughter [name]  to discuss diagnosis prognosis, [place], EOL wishes, disposition and options.  I introduced Palliative Medicine as specialized medical care for people living with serious illness. It focuses on providing relief from the symptoms and stress of a serious illness. The goal is to improve quality of life for both the patient and the family.  We discussed a brief life review of the patient. He lives at home with his wife who is in good health.  His primary care takers are his wife and daughter [name].  He has a large extended family with many grandchildren and great grandchildren.  He has been very active in the church thru out his life.  [name] states "He built our church".  [name] and the rest of the family were there every day when it was first built.  He retired from being the [place] but remains the [employer] in his [place].  As far as functional and nutritional status he eats very well.  Prior to admission he fed himself and was able to walk without assistance in the house.  Outside of the house he utilizes a rolling walker.   Per [name] - we know daddy is really sick if he stops eating because he has always had a very good appetite.  We discussed [name] illness in general terms.  [name] seemed to have good understanding and acceptance of his heart disease, COPD, and CKD.  She understood that we are balancing his co-morbidities in the best way possible but he is fragile. [name] is aware of the planned cardioversion later this week and she is supportive of going forward with it.    We agreed to stay in touch over the course of his hospitalization.  If he progresses well he may discharge to  SNF for short term rehab, alternatively he may be a good candidate for hospice services at home.  We made plans to schedule a larger family meeting involving Mrs. Atiyeh and all 3 of their children.  Questions and concerns were addressed.  The family was encouraged to call  with questions or concerns.    Primary Decision Maker:  NEXT OF KIN  His 3 children [name] and her two brothers)    SUMMARY OF RECOMMENDATIONS    PMT will follow with you and monitor his progression.  If he does well he may d/c to SNF rehab.  He has been to [place] in the past.  If he does not progress well he is a good candidate for hospice services at home.  Recommend early out of bed/PT/OT in an attempt to maintain his functional status.  (He was walking at home prior to admission)   Code Status/Advance Care Planning:  DNR   Symptom Management:   Per primary team.  Would recommend getting him Out of bed as soon as OK with cardiology in an attempt to maintain his functional status.  Additional Recommendations (Limitations, Scope, Preferences):  Full Scope Treatment  Palliative Prophylaxis:    Aspiration  Psycho-social/Spiritual:   Desire for further Chaplaincy support:  Prognosis:   Difficult to determine.  Will know more as his hospitalization progresses.    Discharge Planning: To Be Determined      Primary Diagnoses: Present on Admission: . Acute respiratory failure ([place]) . Acute exacerbation of chronic obstructive pulmonary disease (COPD) ([place]) . Acute on chronic combined systolic and diastolic CHF (congestive heart failure) ([place]) . Atrial flutter with rapid ventricular response ([place]) . Cardiomyopathy, ischemic . 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 Chronic kidney disease . Atrial fibrillation with RVR ([name])   I have reviewed the medical record, interviewed the patient and family, and examined the patient. The following aspects are pertinent.  Past Medical History:  Diagnosis Date  . Asthma   . Cardiomyopathy ([place])    a. 2D echo 2012: EF 30-35%, diffuse HK, grade 1 DD, trivial pericardial effusion.  Marland Kitchen COPD (chronic obstructive pulmonary disease) ([place])   . Diabetes mellitus type 2 in nonobese (HCC)   . Former tobacco use   . LBBB (left bundle branch block)   . Prostate cancer ([place])   . Stroke [facility])    Social History   Socioeconomic History  . Marital status: Married    Spouse name: Not on file  . Number of children: Not on file  . Years of education: Not on file  . Highest education level: Not on file  Occupational History  . Occupation: retired  Scientific laboratory technician  . Financial resource strain: Not on file  . Food insecurity:    Worry: Not on file    Inability: Not on file  . Transportation needs:    Medical: Not on file    Non-medical: Not on file  Tobacco Use  . Smoking status: Former Smoker    Types: Cigarettes    Last attempt to quit: 10/23/1957    Years since quitting: 60.5  . Smokeless tobacco: Never Used  . Tobacco comment: Smoked 1/2 ppd x 23 years  Substance and Sexual Activity  . Alcohol use:  No  . Drug use: No  . Sexual activity: Not Currently    Partners: Female  Lifestyle  . Physical activity:    Days per week: Not on file    Minutes per session: Not on file  . Stress: Not on file  Relationships  . Social connections:    Talks on phone: Not on file    Gets together: Not on file    Attends religious service: Not on file    Active member of club or organization: Not  on file    Attends meetings of clubs or organizations: Not on file    Relationship status: Not on file  Other Topics Concern  . Not on file  Social History Narrative   Lives with wife, does ambulate at home without adjuncts, however when going out uses walker, cane.    Family History  Problem Relation Age of Onset  . Diabetes Maternal Aunt    Scheduled Meds: . apixaban  2.5 mg Oral BID  . carvedilol  12.5 mg Oral BID  . cholecalciferol  1,000 Units Oral Daily  . clopidogrel  75 mg Oral Daily  . furosemide  80 mg Intravenous BID  . insulin aspart  0-5 Units Subcutaneous QHS  . insulin aspart  0-9 Units Subcutaneous TID WC  . insulin detemir  20 Units Subcutaneous QHS  . ipratropium-albuterol  3 mL Nebulization TID  . pravastatin  80 mg Oral q1800   Continuous Infusions: . amiodarone 30 mg/hr (05/07/18 0723)   PRN Meds:.ondansetron (ZOFRAN) IV No Known Allergies Review of Systems Patient able to mumble yes/no answers but not able to give an ROS.  Physical Exam  Well developed male, awake, alert, being fed by his daughter and eating well CV irreg irreg Resp no distress, no w/c/r  Abdomen mild distention, NT Lower Ext with 2+ edema   Vital Signs: BP 106/78 (BP Location: Left Arm)   Pulse (!) 107   Temp 97.8 F (36.6 C) (Oral)   Resp (!) 23   Wt 93.6 kg (206 lb 5.6 oz)   SpO2 100%   BMI 28.78 kg/m  Pain Scale: 0-10   Pain Score: 0-No pain   SpO2: SpO2: 100 % O2 Device:SpO2: 100 % O2 Flow Rate: .O2 Flow Rate (L/min): 3 L/min  IO: Intake/output summary:   Intake/Output Summary  (Last 24 hours) at 05/07/2018 1009 Last data filed at 05/07/2018 0620 Gross per 24 hour  Intake 703.73 ml  Output 100 ml  Net 603.73 ml    LBM:   Baseline Weight: Weight: 94 kg (207 lb 3.7 oz) Most recent weight: Weight: 93.6 kg (206 lb 5.6 oz)     Palliative Assessment/Data: 30%     Time In: 11:00 Time Out: 11:50 Time Total: 50 min. Greater than 50%  of this time was spent counseling and coordinating care related to the above assessment and plan.  Signed by: Florentina Jenny, PA-C Palliative Medicine Pager: [phone]  Please contact Palliative Medicine Team phone at [phone] for questions and concerns.  For individual provider: See Shea Evans

## 2018-05-07 NOTE — Progress Notes (Addendum)
PROGRESS NOTE    [name]  WNI:[id number] DOB: 06-16-1929 DOA: 05/06/2018 PCP: Katherina Mires, MD   Brief Narrative: 84 y.o. male with medical history significant for COPD not on home oxygen, CVA 2012, cardiomyopathy known left bundle branch block, asthma, tobacco use, prostate cancer, diabetes, paroxysmal A. Fib, heart failure  emergency department with the chief complaint worsening shortness of breath. Initial evaluation reveals acute respiratory failure likely with multiple etiologies specifically acute on chronic CHF, COPD exacerbation and A. Fib with RVR. [facility] are asked to admit  Information is obtained from the patient and his wife is at the bedside and the chart. Patient states he's been as usual state of health until 2-3 days ago he developed "a cold". He reports head congestion postnasal drip intermittent nonproductive cough. Wife also reports inaudible wheeze intermittently. He denies fever chills nausea vomiting. Associated symptoms do include worsening lower extremity edema and orthopnea. Wife states he had to "get out of the bed and sit up to breathe". He denies chest pain palpitations. He denies abdominal pain nausea vomiting diarrhea constipation melena bright red blood per rectum. He denies dysuria hematuria frequency or urgency.  ED Course: emergency department is afebrile blood pressure in the low end of normal EKG reveals A. Fib with RVR at a rate of 150 oxygen saturation level 87% on room air. He is provided with nebulizers, IV Lasix 40 mg, nitroglycerin drip and BiPAP. At the time of admission he is on nasal cannula without increased work of breathing and his [name] saturation levels greater than 90% on 3 L.    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  2] COPD exacerbation mild continue nebulizer.mild wheezing this morning.continue nebulizer.not on steroids or antibiotics.  3] acute on chronic combined systolic and diastolic heart failure.echo pending for today.he had increased weight gain increasing lower extremity edema.he does not check weights at home.continue lasix 80 mg bid,coreg eliquis.  4] A. fib with RVR on amio drip plan for TEE DCCV this Thursday.  5] type 2 diabetes CKD stage III dc levemir due to hypoglycemia.continue ssi      DVT prophylaxis: eliquis Code Statusdnr Family Communication:dw wife and daughter Disposition Plan:  tbd Consultants: chmg  Procedures:none Antimicrobials: none Subjective:feels better   Objective: Vitals:   05/07/18 0447 05/07/18 0758 05/07/18 0830 05/07/18 1147  BP: 102/79 106/78  107/74  Pulse: 91 (!) 107  (!) 107  Resp:  (!) 23  (!) 23  Temp: 98 F (36.7 C) 97.8 F (36.6 C)  98.3 F (36.8 C)  TempSrc: Oral Oral  Oral  SpO2: 100% 100% 100% 98%  Weight: 93.6 kg (206 lb 5.6 oz)       Intake/Output Summary (Last 24 hours) at 05/07/2018 1332 Last data filed at 05/07/2018 1300 Gross per 24 hour  Intake 1063.73 ml  Output 500 ml  Net 563.73 ml   Filed Weights   05/06/18 2130 05/07/18 0447  Weight: 94 kg (207 lb 3.7  oz) 93.6 kg (206 lb 5.6 oz)    Examination:  General exam: Appears calm and comfortable  Respiratory system: [place] at bases auscultation. Respiratory effort normal. Cardiovascular system: S1 & S2 heard, RRR. No JVD, murmurs, rubs, gallops or clicks. No pedal  edema. Gastrointestinal system: Abdomen is nondistended, soft and nontender. No organomegaly or masses felt. Normal bowel sounds heard. Central nervous system: Alert and oriented. No focal neurological deficits. Extremities: 3 plus edema Skin: No rashes, lesions or ulcers Psychiatry: Judgement and insight appear normal. Mood & affect appropriate.     Data Reviewed: I have personally reviewed following labs and imaging studies  CBC: Recent Labs  Lab 05/06/18 1248 05/07/18 0637  WBC 9.1 9.1  HGB 12.9* 12.0*  HCT 41.4 38.7*  MCV 93.2 92.1  PLT 129* 416*   Basic Metabolic Panel: Recent Labs  Lab 05/06/18 1248 05/07/18 0637  NA 140 140  K 4.4 4.4  CL 104 105  CO2 27 28  GLUCOSE 195* 63*  BUN 36* 36*  CREATININE 1.65* 1.69*  CALCIUM 8.7* 8.4*   GFR: CrCl cannot be calculated (Unknown ideal weight.). Liver Function Tests: No results for input(s): AST, ALT, ALKPHOS, BILITOT, PROT, ALBUMIN in the last 168 hours. No results for input(s): LIPASE, AMYLASE in the last 168 hours. No results for input(s): AMMONIA in the last 168 hours. Coagulation Profile: No results for input(s): INR, PROTIME in the last 168 hours. Cardiac Enzymes: Recent Labs  Lab 05/06/18 1248  TROPONINI <0.03   BNP (last 3 results) No results for input(s): PROBNP in the last 8760 hours. HbA1C: No results for input(s): HGBA1C in the last 72 hours. CBG: Recent Labs  Lab 05/06/18 1707 05/06/18 2132 05/07/18 0040 05/07/18 0752 05/07/18 0924  GLUCAP 96 89 120* 48* 94   Lipid Profile: No results for input(s): CHOL, HDL, LDLCALC, TRIG, CHOLHDL, LDLDIRECT in the last 72 hours. Thyroid Function Tests: Recent Labs    05/06/18 2238  TSH 2.026   Anemia Panel: No results for input(s): VITAMINB12, FOLATE, FERRITIN, TIBC, IRON, RETICCTPCT in the last 72 hours. Sepsis Labs: No results for input(s): PROCALCITON, LATICACIDVEN in the last 168 hours.  Recent Results (from the past 240 hour(s))  MRSA PCR  Screening     Status: None   Collection Time: 05/07/18  4:26 AM  Result Value Ref Range Status   MRSA by PCR NEGATIVE NEGATIVE Final    Comment:        The GeneXpert MRSA Assay (FDA approved for NASAL specimens only), is one component of a comprehensive MRSA colonization surveillance program. It is not intended to diagnose MRSA infection nor to guide or monitor treatment for MRSA infections. Performed at [facility], [place] [address] [place], [place] [address]          Radiology Studies: Dg Chest 2 View  Result Date: 05/06/2018 CLINICAL DATA:  Chest pain, shortness of breath, dry cough EXAM: CHEST - 2 VIEW COMPARISON:  05/09/2017 FINDINGS: Cardiomegaly. Increased markings in the lung bases could reflect atelectasis or scarring. No effusions. No acute bony abnormality. IMPRESSION: Cardiomegaly.  Bibasilar atelectasis or scarring. Electronically Signed   By: Rolm Baptise M.D.   On: 05/06/2018 10:48        Scheduled Meds: . apixaban  2.5 mg Oral BID  . carvedilol  12.5 mg Oral BID  . cholecalciferol  1,000 Units Oral Daily  . clopidogrel  75 mg Oral Daily  . furosemide  80 mg Intravenous BID  . insulin aspart  0-5 Units Subcutaneous QHS  . insulin aspart  0-9 Units Subcutaneous TID WC  . insulin detemir  20 Units Subcutaneous QHS  . ipratropium-albuterol  3 mL Nebulization TID  . pravastatin  80 mg Oral q1800   Continuous Infusions: . amiodarone 30 mg/hr (05/07/18 0723)     LOS: 1 day     Georgette Shell, MD [facility]  If 7PM-7AM, please contact night-coverage [email or web address] Password [email or web address] 05/07/2018, 1:32 PM

## 2018-05-07 NOTE — Progress Notes (Signed)
DAILY PROGRESS NOTE   Patient Name: Dustin Leonard. Date of Encounter: 05/07/2018  Chief Complaint   Short of breath  Patient Profile   Dustin Leonard. is a 84 y.o. male with a history of hx of COPD, CVA 2012, and 2 prior strokes total), PA flutter  Cardiomyopathy dx at time of CVA, (no further work up) known LBBB, asthma, 20-30 yrs of tobacco use, prostate CA, DM and now presents in a fib with RVR.   Pt being seen today for the evaluation of acute systolic HF and a fib with RVR at the request of Dr. Tyrone Nine  Subjective   Noted to have hypotension overnight. Switched from cardizem to amiodarone for rate control - Eliquis restarted. BP improved. Breathing is better. Not much diuresis recorded, although weight is down 1 lb.  Objective   Vitals:   05/07/18 0005 05/07/18 0447 05/07/18 0758 05/07/18 0830  BP:  102/79 106/78   Pulse:  91 (!) 107   Resp:   (!) 23   Temp: 98.3 F (36.8 C) 98 F (36.7 C) 97.8 F (36.6 C)   TempSrc: Oral Oral Oral   SpO2:  100% 100% 100%  Weight:  206 lb 5.6 oz (93.6 kg)      Intake/Output Summary (Last 24 hours) at 05/07/2018 1048 Last data filed at 05/07/2018 1040 Gross per 24 hour  Intake 943.73 ml  Output 100 ml  Net 843.73 ml   Filed Weights   05/06/18 2130 05/07/18 0447  Weight: 207 lb 3.7 oz (94 kg) 206 lb 5.6 oz (93.6 kg)    Physical Exam   General appearance: alert and no distress Lungs: diminished breath sounds bilaterally Heart: irregularly irregular rhythm Extremities: edema 2+ bilateral pitting edema Neurologic: Mental status: Alert, oriented, thought content appropriate, mild right facial droop (old)  Inpatient Medications    Scheduled Meds: . apixaban  2.5 mg Oral BID  . carvedilol  12.5 mg Oral BID  . cholecalciferol  1,000 Units Oral Daily  . clopidogrel  75 mg Oral Daily  . furosemide  80 mg Intravenous BID  . insulin aspart  0-5 Units Subcutaneous QHS  . insulin aspart  0-9 Units Subcutaneous TID WC  .  insulin detemir  20 Units Subcutaneous QHS  . ipratropium-albuterol  3 mL Nebulization TID  . pravastatin  80 mg Oral q1800    Continuous Infusions: . amiodarone 30 mg/hr (05/07/18 0723)    PRN Meds: ondansetron (ZOFRAN) IV   Labs   Results for orders placed or performed during the hospital encounter of 05/06/18 (from the past 48 hour(s))  CBC     Status: Abnormal   Collection Time: 05/06/18 12:48 PM  Result Value Ref Range   WBC 9.1 4.0 - 10.5 K/uL   RBC 4.44 4.22 - 5.81 MIL/uL   Hemoglobin 12.9 (L) 13.0 - 17.0 g/dL   HCT 41.4 39.0 - 52.0 %   MCV 93.2 78.0 - 100.0 fL   MCH 29.1 26.0 - 34.0 pg   MCHC 31.2 30.0 - 36.0 g/dL   RDW 13.6 11.5 - 15.5 %   Platelets 129 (L) 150 - 400 K/uL    Comment: Performed at [facility], [place] [address] [place], [place] [address]  Basic metabolic panel     Status: Abnormal   Collection Time: 05/06/18 12:48 PM  Result Value Ref Range   Sodium 140 135 - 145 mmol/L   Potassium 4.4 3.5 - 5.1 mmol/L   Chloride 104 98 -  111 mmol/L    Comment: Please note change in reference range.   CO2 27 22 - 32 mmol/L   Glucose, Bld 195 (H) 70 - 99 mg/dL    Comment: Please note change in reference range.   BUN 36 (H) 8 - 23 mg/dL    Comment: Please note change in reference range.   Creatinine, Ser 1.65 (H) 0.61 - 1.24 mg/dL   Calcium 8.7 (L) 8.9 - 10.3 mg/dL   GFR calc non Af Amer 36 (L) >60 mL/min   GFR calc Af Amer 41 (L) >60 mL/min    Comment: (NOTE) The eGFR has been calculated using the CKD EPI equation. This calculation has not been validated in all clinical situations. eGFR's persistently <60 mL/min signify possible Chronic Kidney Disease.    Anion gap 9 5 - 15    Comment: Performed at [facility] [address], [place], [place] [address]  Brain natriuretic peptide     Status: Abnormal   Collection Time: 05/06/18 12:48 PM  Result Value Ref Range   B Natriuretic Peptide 881.6 (H) 0.0 - 100.0 pg/mL    Comment: Performed at [facility] [address], [place], [place] [address]  Troponin I     Status: None   Collection Time: 05/06/18 12:48 PM  Result Value Ref Range   Troponin I <0.03 <0.03 ng/mL    Comment: Performed at [facility] [address] [place], [place] [address]  CBG monitoring, ED     Status: None   Collection Time: 05/06/18  5:07 PM  Result Value Ref Range   Glucose-Capillary 96 70 - 99 mg/dL  Glucose, capillary     Status: None   Collection Time: 05/06/18  9:32 PM  Result Value Ref Range   Glucose-Capillary 89 70 - 99 mg/dL  TSH     Status: None   Collection Time: 05/06/18 10:38 PM  Result Value Ref Range   TSH 2.026 0.350 - 4.500 uIU/mL    Comment: Performed by a 3rd Generation assay with a functional sensitivity of <=0.01 uIU/mL. Performed at [facility], [place] [address] [place], [place] [address]   Glucose, capillary     Status: Abnormal   Collection Time: 05/07/18 12:40 AM  Result Value Ref Range   Glucose-Capillary 120 (H) 70 - 99 mg/dL  MRSA PCR Screening     Status: None   Collection Time: 05/07/18  4:26 AM  Result Value Ref Range   MRSA by PCR NEGATIVE NEGATIVE    Comment:        The GeneXpert MRSA Assay (FDA approved for NASAL specimens only), is one component of a comprehensive MRSA colonization surveillance program. It is not intended to diagnose MRSA infection nor to guide or monitor treatment for MRSA infections. Performed at [facility], [place] [address] [place], [place] [address]   Basic metabolic panel     Status: Abnormal   Collection Time: 05/07/18  6:37 AM  Result Value Ref Range   Sodium 140 135 - 145 mmol/L   Potassium 4.4 3.5 - 5.1 mmol/L   Chloride 105 98 - 111 mmol/L    Comment: Please note change in reference range.   CO2 28 22 - 32 mmol/L   Glucose, Bld 63 (L) 70 - 99 mg/dL    Comment: Please note change in reference range.   BUN 36 (H) 8 - 23 mg/dL    Comment: Please note change in reference  range.   Creatinine, Ser  1.69 (H) 0.61 - 1.24 mg/dL   Calcium 8.4 (L) 8.9 - 10.3 mg/dL   GFR calc non Af Amer 35 (L) >60 mL/min   GFR calc Af Amer 40 (L) >60 mL/min    Comment: (NOTE) The eGFR has been calculated using the CKD EPI equation. This calculation has not been validated in all clinical situations. eGFR's persistently <60 mL/min signify possible Chronic Kidney Disease.    Anion gap 7 5 - 15    Comment: Performed at [facility] [address] [place], [place] [address]  CBC     Status: Abnormal   Collection Time: 05/07/18  6:37 AM  Result Value Ref Range   WBC 9.1 4.0 - 10.5 K/uL   RBC 4.20 (L) 4.22 - 5.81 MIL/uL   Hemoglobin 12.0 (L) 13.0 - 17.0 g/dL   HCT 38.7 (L) 39.0 - 52.0 %   MCV 92.1 78.0 - 100.0 fL   MCH 28.6 26.0 - 34.0 pg   MCHC 31.0 30.0 - 36.0 g/dL   RDW 13.8 11.5 - 15.5 %   Platelets 131 (L) 150 - 400 K/uL    Comment: Performed at [facility], [place] [address], [place], [place] [address]  Glucose, capillary     Status: Abnormal   Collection Time: 05/07/18  7:52 AM  Result Value Ref Range   Glucose-Capillary 48 (L) 70 - 99 mg/dL  Glucose, capillary     Status: None   Collection Time: 05/07/18  9:24 AM  Result Value Ref Range   Glucose-Capillary 94 70 - 99 mg/dL    ECG   N/A  Telemetry   A-fib with CVR - Personally Reviewed  Radiology    Dg Chest 2 View  Result Date: 05/06/2018 CLINICAL DATA:  Chest pain, shortness of breath, dry cough EXAM: CHEST - 2 VIEW COMPARISON:  05/09/2017 FINDINGS: Cardiomegaly. Increased markings in the lung bases could reflect atelectasis or scarring. No effusions. No acute bony abnormality. IMPRESSION: Cardiomegaly.  Bibasilar atelectasis or scarring. Electronically Signed   By: Rolm Baptise M.D.   On: 05/06/2018 10:48    Cardiac Studies   Echo pending  Assessment   1. Principal Problem: 2.   Acute respiratory failure ([place]) 3. Active Problems: 4.   Cardiomyopathy, ischemic 5.   Atrial flutter with rapid ventricular  response ([place]) 6.   Acute exacerbation of chronic obstructive pulmonary disease (COPD) ([place]) 7.   Acute on chronic combined systolic and diastolic CHF (congestive heart failure) ([place]) 8.   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   Chronic kidney disease 10.   Atrial fibrillation with RVR ([place]) 11.   Plan   1. Mr. Whitby has better rate control on amiodarone, however, is not fully anticoagulated. Will continue Eliquis and schedule tentative TEE/DCCV on Thursday (after minimum 5 doses of Eliquis). Will need continued diuresis until then and a repeat echo which is pending today.  Time Spent Directly with Patient:  I have spent a total of 25 minutes with the patient reviewing hospital notes, telemetry, EKGs, labs and examining the patient as well as establishing an assessment and plan that was discussed personally with the patient.  > 50% of time was spent in direct patient care.  Length of Stay:  LOS: 1 day   Pixie Casino, MD, [facility], [place] Director of the Advanced Lipid Disorders &  Cardiovascular Risk Reduction Clinic  Diplomate of the [employer] of Clinical Lipidology Attending Cardiologist  Direct Dial: [phone]  Fax: [phone]  Website:  [email or web address] Holt 05/07/2018, 10:48 AM

## 2018-05-07 NOTE — Progress Notes (Signed)
Patient vomited a total of 6 times after coming to unit. Paged cards- zofran ordered prn.

## 2018-05-08 ENCOUNTER — Other Ambulatory Visit (HOSPITAL_COMMUNITY): Payer: Medicare Other

## 2018-05-08 DIAGNOSIS — J441 Chronic obstructive pulmonary disease with (acute) exacerbation: Secondary | ICD-10-CM

## 2018-05-08 DIAGNOSIS — I509 Heart failure, unspecified: Secondary | ICD-10-CM

## 2018-05-08 LAB — GLUCOSE, CAPILLARY
GLUCOSE-CAPILLARY: 129 mg/dL — AB (ref 70–99)
GLUCOSE-CAPILLARY: 142 mg/dL — AB (ref 70–99)
GLUCOSE-CAPILLARY: 162 mg/dL — AB (ref 70–99)
GLUCOSE-CAPILLARY: 251 mg/dL — AB (ref 70–99)
Glucose-Capillary: 149 mg/dL — ABNORMAL HIGH (ref 70–99)

## 2018-05-08 MED ORDER — IPRATROPIUM-ALBUTEROL 0.5-2.5 (3) MG/3ML IN SOLN
3.0000 mL | Freq: Four times a day (QID) | RESPIRATORY_TRACT | Status: DC
  Administered 2018-05-08 – 2018-05-09 (×4): 3 mL via RESPIRATORY_TRACT
  Filled 2018-05-08 (×4): qty 3

## 2018-05-08 MED ORDER — FUROSEMIDE 10 MG/ML IJ SOLN
80.0000 mg | Freq: Three times a day (TID) | INTRAMUSCULAR | Status: AC
  Administered 2018-05-08 – 2018-05-09 (×5): 80 mg via INTRAVENOUS
  Filled 2018-05-08 (×5): qty 8

## 2018-05-08 MED ORDER — IPRATROPIUM-ALBUTEROL 0.5-2.5 (3) MG/3ML IN SOLN: 3.0000 mL | Freq: Four times a day (QID) | RESPIRATORY_TRACT | Status: DC

## 2018-05-08 NOTE — Progress Notes (Signed)
Physical Therapy Treatment Patient Details Name: Dustin Leonard. MRN: [id number] DOB: 1929-07-04 Today's Date: 05/08/2018    History of Present Illness Dustin Leonard. is a 84 y.o. male with a history of hx of COPD, CVA 2012, and 2 prior strokes total), PA flutter  Cardiomyopathy dx at time of CVA, (no further work up) known LBBB, asthma, 20-30 yrs of tobacco use, prostate CA, DM and now presents in a fib with RVR.     PT Comments    Pt sleeping upon entry, awakened with gentle tactile stimulation. Pt agreeable to participate. Pt received on 3L/min O2, but trialed on room air in session, no lower than 92% AMB in room, and returns to 100% resting in bed at end of session. Pt able to progress aMB ~5-10 feet, but visible signs of fatigue at end of AMB.  Pt continues to require heavy physical assistance for balance deficits and heavy verbal cues for navigation and safe RW use. Physical assis required at end of session for RW use. PT still concerns regarding 4 steps to enter home at DC. PT would really do better with ramp to access home. Will continue to follow acutely.   Follow Up Recommendations  No PT follow up;Supervision/Assistance - 24 hour;Supervision for mobility/OOB(family declines PT )     Equipment Recommendations  None recommended by PT    Recommendations for Other Services       Precautions / Restrictions Precautions Precautions: Fall Restrictions Weight Bearing Restrictions: No    Mobility  Bed Mobility Overal bed mobility: Needs Assistance Bed Mobility: Supine to Sit     Supine to sit: Supervision     General bed mobility comments: heavy effor tneeded, but able to move without physical assistance.   Transfers Overall transfer level: Needs assistance Equipment used: Rolling walker (2 wheeled) Transfers: Sit to/from Stand Sit to Stand: Min assist;Mod assist;From elevated surface         General transfer comment: Performed multiple reps (6x) from EOB;  effort is easier from elevated surface, but ability to establish balance is still limited and requires ModA for stailization intermittently, eventually able to stand independently with RW.   Ambulation/Gait Ambulation/Gait assistance: Min assist;Mod assist Gait Distance (Feet): 40 Feet Assistive device: Rolling walker (2 wheeled)   Gait velocity: 0.63m/s  Gait velocity interpretation: <1.31 ft/sec, indicative of household ambulator General Gait Details: Inititally moving well followinf VC, slow in general, but upon return, more pausing, more delay in folowing commands, and poor navigation of RW. LOB during turns.    Stairs             Wheelchair Mobility    Modified Rankin (Stroke Patients Only)       Balance Overall balance assessment: Needs assistance         Standing balance support: Bilateral upper extremity supported;During functional activity Standing balance-Leahy Scale: Poor Standing balance comment: relies on RW for support and physical assistance during transisitons and turns.                             Cognition Arousal/Alertness: Awake/alert Behavior During Therapy: Flat affect Overall Cognitive Status: History of cognitive impairments - at baseline                                        Exercises      General Comments  Pertinent Vitals/Pain Pain Assessment: No/denies pain    Home Living                      Prior Function            PT Goals (current goals can now be found in the care plan section) Acute Rehab PT Goals Patient Stated Goal: to  go home PT Goal Formulation: With patient Time For Goal Achievement: 05/21/18 Potential to Achieve Goals: Good Progress towards PT goals: Progressing toward goals    Frequency    Min 3X/week      PT Plan Current plan remains appropriate    Co-evaluation              AM-PAC PT "6 Clicks" Daily Activity  Outcome Measure  Difficulty  turning over in bed (including adjusting bedclothes, sheets and blankets)?: A Little Difficulty moving from lying on back to sitting on the side of the bed? : A Lot Difficulty sitting down on and standing up from a chair with arms (e.g., wheelchair, bedside commode, etc,.)?: Unable Help needed moving to and from a bed to chair (including a wheelchair)?: A Little Help needed walking in hospital room?: A Lot Help needed climbing 3-5 steps with a railing? : A Lot 6 Click Score: 13    End of Session Equipment Utilized During Treatment: Gait belt Activity Tolerance: Patient limited by fatigue Patient left: in bed;with bed alarm set;with call bell/phone within reach Nurse Communication: Mobility status PT Visit Diagnosis: Muscle weakness (generalized) (M62.81)     Time: 7412-8786 PT Time Calculation (min) (ACUTE ONLY): 26 min  Charges:  $Therapeutic Activity: 23-37 mins                    G Codes:       2:44 PM, 05/30/18 Etta Grandchild, PT, DPT Physical Therapist - [place] [phone] (Pager)  [phone] (Office)      Xolani Degracia C May 30, 2018, 2:41 PM

## 2018-05-08 NOTE — Progress Notes (Addendum)
DAILY PROGRESS NOTE   Patient Name: Dustin Leonard. Date of Encounter: 05/08/2018  Chief Complaint   Wheezy today  Patient Profile   Dustin Leonard. is a 84 y.o. male with a history of hx of COPD, CVA 2012, and 2 prior strokes total), PA flutter  Cardiomyopathy dx at time of CVA, (no further work up) known LBBB, asthma, 20-30 yrs of tobacco use, prostate CA, DM and now presents in a fib with RVR.   Pt being seen today for the evaluation of acute systolic HF and a fib with RVR at the request of Dr. Tyrone Nine  Subjective   BP improved on amiodarone. Rate improved as well. Diuresed about 1L overnight. Weight is stable around 206-207 lbs. Wheezy today.  Objective   Vitals:   05/08/18 0000 05/08/18 0454 05/08/18 0801 05/08/18 0911  BP: 104/66 106/64 108/68 116/87  Pulse: 99 96 92 88  Resp: (!) _0 (!) 24  Temp: 98.4 F (36.9 C) 98.4 F (36.9 C) 98.4 F (36.9 C)   TempSrc: Oral Oral Oral   SpO2: 100% 100% 100% 100%  Weight:  207 lb 10.8 oz (94.2 kg)      Intake/Output Summary (Last 24 hours) at 05/08/2018 0926 Last data filed at 05/07/2018 1919 Gross per 24 hour  Intake 600 ml  Output 1325 ml  Net -725 ml   Filed Weights   05/06/18 2130 05/07/18 0447 05/08/18 0454  Weight: 207 lb 3.7 oz (94 kg) 206 lb 5.6 oz (93.6 kg) 207 lb 10.8 oz (94.2 kg)    Physical Exam   General appearance: alert and mild distress Lungs: diminished breath sounds bilaterally and wheezes bilaterally Heart: irregularly irregular rhythm Extremities: edema 1+ bilateral pitting edema Neurologic: Mental status: Alert, oriented, thought content appropriate, mild right facial droop (old)  Inpatient Medications    Scheduled Meds: . apixaban  2.5 mg Oral BID  . carvedilol  12.5 mg Oral BID  . cholecalciferol  1,000 Units Oral Daily  . clopidogrel  75 mg Oral Daily  . furosemide  80 mg Intravenous BID  . insulin aspart  0-5 Units Subcutaneous QHS  . insulin aspart  0-9 Units Subcutaneous  TID WC  . ipratropium-albuterol  3 mL Nebulization TID  . pravastatin  80 mg Oral q1800    Continuous Infusions: . amiodarone 30 mg/hr (05/08/18 0625)    PRN Meds: ondansetron (ZOFRAN) IV   Labs   Results for orders placed or performed during the hospital encounter of 05/06/18 (from the past 48 hour(s))  CBC     Status: Abnormal   Collection Time: 05/06/18 12:48 PM  Result Value Ref Range   WBC 9.1 4.0 - 10.5 K/uL   RBC 4.44 4.22 - 5.81 MIL/uL   Hemoglobin 12.9 (L) 13.0 - 17.0 g/dL   HCT 41.4 39.0 - 52.0 %   MCV 93.2 78.0 - 100.0 fL   MCH 29.1 26.0 - 34.0 pg   MCHC 31.2 30.0 - 36.0 g/dL   RDW 13.6 11.5 - 15.5 %   Platelets 129 (L) 150 - 400 K/uL    Comment: Performed at [facility], [place] [address], [place], [place] [address]  Basic metabolic panel     Status: Abnormal   Collection Time: 05/06/18 12:48 PM  Result Value Ref Range   Sodium 140 135 - 145 mmol/L   Potassium 4.4 3.5 - 5.1 mmol/L   Chloride 104 98 - 111 mmol/L    Comment: Please note change in  reference range.   CO2 27 22 - 32 mmol/L   Glucose, Bld 195 (H) 70 - 99 mg/dL    Comment: Please note change in reference range.   BUN 36 (H) 8 - 23 mg/dL    Comment: Please note change in reference range.   Creatinine, Ser 1.65 (H) 0.61 - 1.24 mg/dL   Calcium 8.7 (L) 8.9 - 10.3 mg/dL   GFR calc non Af Amer 36 (L) >60 mL/min   GFR calc Af Amer 41 (L) >60 mL/min    Comment: (NOTE) The eGFR has been calculated using the CKD EPI equation. This calculation has not been validated in all clinical situations. eGFR's persistently <60 mL/min signify possible Chronic Kidney Disease.    Anion gap 9 5 - 15    Comment: Performed at [facility] [address], [place], [place] [address]  Brain natriuretic peptide     Status: Abnormal   Collection Time: 05/06/18 12:48 PM  Result Value Ref Range   B Natriuretic Peptide 881.6 (H) 0.0 - 100.0 pg/mL    Comment: Performed at [facility] [address]  [place], [place] [address]  Troponin I     Status: None   Collection Time: 05/06/18 12:48 PM  Result Value Ref Range   Troponin I <0.03 <0.03 ng/mL    Comment: Performed at [facility] [address], [place], [place] [address]  CBG monitoring, ED     Status: None   Collection Time: 05/06/18  5:07 PM  Result Value Ref Range   Glucose-Capillary 96 70 - 99 mg/dL  Glucose, capillary     Status: None   Collection Time: 05/06/18  9:32 PM  Result Value Ref Range   Glucose-Capillary 89 70 - 99 mg/dL  TSH     Status: None   Collection Time: 05/06/18 10:38 PM  Result Value Ref Range   TSH 2.026 0.350 - 4.500 uIU/mL    Comment: Performed by a 3rd Generation assay with a functional sensitivity of <=0.01 uIU/mL. Performed at [facility], [place] [address], [place], [place] [address]   Glucose, capillary     Status: Abnormal   Collection Time: 05/07/18 12:40 AM  Result Value Ref Range   Glucose-Capillary 120 (H) 70 - 99 mg/dL  MRSA PCR Screening     Status: None   Collection Time: 05/07/18  4:26 AM  Result Value Ref Range   MRSA by PCR NEGATIVE NEGATIVE    Comment:        The GeneXpert MRSA Assay (FDA approved for NASAL specimens only), is one component of a comprehensive MRSA colonization surveillance program. It is not intended to diagnose MRSA infection nor to guide or monitor treatment for MRSA infections. Performed at [facility], [place] [address], [place], [place] [address]   Basic metabolic panel     Status: Abnormal   Collection Time: 05/07/18  6:37 AM  Result Value Ref Range   Sodium 140 135 - 145 mmol/L   Potassium 4.4 3.5 - 5.1 mmol/L   Chloride 105 98 - 111 mmol/L    Comment: Please note change in reference range.   CO2 28 22 - 32 mmol/L   Glucose, Bld 63 (L) 70 - 99 mg/dL    Comment: Please note change in reference range.   BUN 36 (H) 8 - 23 mg/dL    Comment: Please note change in reference range.   Creatinine, Ser 1.69 (H) 0.61 - 1.24  mg/dL   Calcium  8.4 (L) 8.9 - 10.3 mg/dL   GFR calc non Af Amer 35 (L) >60 mL/min   GFR calc Af Amer 40 (L) >60 mL/min    Comment: (NOTE) The eGFR has been calculated using the CKD EPI equation. This calculation has not been validated in all clinical situations. eGFR's persistently <60 mL/min signify possible Chronic Kidney Disease.    Anion gap 7 5 - 15    Comment: Performed at [facility] [address] [place], [place] [address]  CBC     Status: Abnormal   Collection Time: 05/07/18  6:37 AM  Result Value Ref Range   WBC 9.1 4.0 - 10.5 K/uL   RBC 4.20 (L) 4.22 - 5.81 MIL/uL   Hemoglobin 12.0 (L) 13.0 - 17.0 g/dL   HCT 38.7 (L) 39.0 - 52.0 %   MCV 92.1 78.0 - 100.0 fL   MCH 28.6 26.0 - 34.0 pg   MCHC 31.0 30.0 - 36.0 g/dL   RDW 13.8 11.5 - 15.5 %   Platelets 131 (L) 150 - 400 K/uL    Comment: Performed at [facility], [place] [address] [place], [place] [address]  Glucose, capillary     Status: Abnormal   Collection Time: 05/07/18  7:52 AM  Result Value Ref Range   Glucose-Capillary 48 (L) 70 - 99 mg/dL  Glucose, capillary     Status: None   Collection Time: 05/07/18  9:24 AM  Result Value Ref Range   Glucose-Capillary 94 70 - 99 mg/dL  Glucose, capillary     Status: Abnormal   Collection Time: 05/07/18 11:45 AM  Result Value Ref Range   Glucose-Capillary 149 (H) 70 - 99 mg/dL  Glucose, capillary     Status: Abnormal   Collection Time: 05/07/18  4:21 PM  Result Value Ref Range   Glucose-Capillary 180 (H) 70 - 99 mg/dL  Glucose, capillary     Status: Abnormal   Collection Time: 05/07/18  9:09 PM  Result Value Ref Range   Glucose-Capillary 217 (H) 70 - 99 mg/dL  Glucose, capillary     Status: Abnormal   Collection Time: 05/08/18  8:04 AM  Result Value Ref Range   Glucose-Capillary 129 (H) 70 - 99 mg/dL    ECG   N/A  Telemetry   A-fib with CVR - Personally Reviewed  Radiology    Dg Chest 2 View  Result Date: 05/06/2018 CLINICAL DATA:  Chest pain, shortness of  breath, dry cough EXAM: CHEST - 2 VIEW COMPARISON:  05/09/2017 FINDINGS: Cardiomegaly. Increased markings in the lung bases could reflect atelectasis or scarring. No effusions. No acute bony abnormality. IMPRESSION: Cardiomegaly.  Bibasilar atelectasis or scarring. Electronically Signed   By: Rolm Baptise M.D.   On: 05/06/2018 10:48    Cardiac Studies   Echo pending  Assessment   Principal Problem:   Acute respiratory failure (HCC) Active Problems:   Cardiomyopathy, ischemic   Atrial flutter with rapid ventricular response (HCC)   Acute exacerbation of chronic obstructive pulmonary disease (COPD) (HCC)   Acute on chronic combined systolic and diastolic CHF (congestive heart failure) ([place])   Poorly controlled type 2 diabetes mellitus with renal complication (HCC)   Chronic kidney disease   Atrial fibrillation with RVR ([place])   Palliative care encounter   Plan   1. Mr. Shiller has better rate control on amiodarone. He has had 4 doses of Eliquis as of this morning. Plan for TEE/DCCV tomorrow. He is  wheezy today - needs nebs QID and additional diuresis. Creatinine stable. Increase lasix to 80 mg IV TID. Continue IV amiodarone and likely switch to po amiodarone after cardioversion. Will d/c 2D TTE since it has not been performed and will have TEE tomorrow.  Time Spent Directly with Patient:  I have spent a total of 25 minutes with the patient reviewing hospital notes, telemetry, EKGs, labs and examining the patient as well as establishing an assessment and plan that was discussed personally with the patient.  > 50% of time was spent in direct patient care.  Length of Stay:  LOS: 2 days   Pixie Casino, MD, [facility], [place] Director of the Advanced Lipid Disorders &  Cardiovascular Risk Reduction Clinic Diplomate of the [employer] of Clinical Lipidology Attending Cardiologist  Direct Dial: [phone]  Fax: [phone]  Website:   [email or web address]  05/08/2018, 9:26 AM

## 2018-05-08 NOTE — Progress Notes (Signed)
PROGRESS NOTE  Dustin Leonard. OHY:[id number] DOB: 04/22/1929 DOA: 05/06/2018 PCP: Katherina Mires, MD  HPI/Recap of past 24 hours:  84 y.o.malewith medical history significantfor COPD not on home oxygen, CVA 2012, cardiomyopathy known left bundle branch block, asthma, tobacco use, prostate cancer, diabetes, paroxysmal A. Fib, heart failure  emergency department with the chief complaint worsening shortness of breath. Initial evaluation reveals acute respiratory failure likely with multiple etiologies specifically acute on chronic CHF, COPD exacerbation and A. Fib with RVR. [facility] are asked to admit  05/08/2018: Patient seen and examined at his bedside.  No new complaints but has audible wheezes.  Denies chest pain.   Assessment/Plan: Principal Problem:   Acute respiratory failure (HCC) Active Problems:   Cardiomyopathy, ischemic   Atrial flutter with rapid ventricular response (HCC)   Acute exacerbation of chronic obstructive pulmonary disease (COPD) (HCC)   Acute on chronic combined systolic and diastolic CHF (congestive heart failure) ([place])   Poorly controlled type 2 diabetes mellitus with renal complication (HCC)   Chronic kidney disease   Atrial fibrillation with RVR (HCC)   Palliative care encounter  Acute hypoxic respiratory failure mostly secondary to acute COPD exacerbation Continue breathing treatments around-the-clock Continue to supply O2 to maintain O2 saturation between 88 and 92%  A. fib/a flutter with RVR On amiodarone IV Cardiology following Started Eliquis for CVA prophylaxis  Chronic systolic CHF Last 2D echo with EF of 40 to 45% in 2018 Continue current medications  AKI on CKD 3 Baseline creatinine appears to be 1.3 Creatinine 1.69 Avoid nephrotoxic agents/hypotension Repeat BMP in the morning  Hyperlipidemia Continue pravastatin     Code Status: DNR  Family Communication: None at bedside  Disposition Plan: Home in 2 to 3  days when cardiology signs of   Consultants:  Cardiology  Procedures:  None  Antimicrobials:  None  DVT prophylaxis: Eliquis   Objective: Vitals:   05/08/18 0911 05/08/18 1153 05/08/18 1354 05/08/18 1437  BP: 116/87 103/72    Pulse: 88 100 97 (!) 108  Resp: (!) 24 (!) 22 (!) 21   Temp:  97.9 F (36.6 C)    TempSrc:  Oral    SpO2: 100% 100% 100% 92%  Weight:        Intake/Output Summary (Last 24 hours) at 05/08/2018 1541 Last data filed at 05/08/2018 1500 Gross per 24 hour  Intake 1826.5 ml  Output 2375 ml  Net -548.5 ml   Filed Weights   05/06/18 2130 05/07/18 0447 05/08/18 0454  Weight: 94 kg (207 lb 3.7 oz) 93.6 kg (206 lb 5.6 oz) 94.2 kg (207 lb 10.8 oz)    Exam:  . General: 84 y.o. year-old male well developed well nourished in no acute distress.  Somnolent but easily arousable to voices.. . Cardiovascular: Regular rate and rhythm with no rubs or gallops.  No thyromegaly or JVD noted.  2+ pitting edema in lower extremities bilaterally. Marland Kitchen Respiratory: Diffuse wheezes bilaterally.  Poor inspiratory effort.. . Abdomen: Soft nontender nondistended with normal bowel sounds x4 quadrants. . Psychiatry: Unable to assess mood due to somnolence.   Data Reviewed: CBC: Recent Labs  Lab 05/06/18 1248 05/07/18 0637  WBC 9.1 9.1  HGB 12.9* 12.0*  HCT 41.4 38.7*  MCV 93.2 92.1  PLT 129* 948*   Basic Metabolic Panel: Recent Labs  Lab 05/06/18 1248 05/07/18 0637  NA 140 140  K 4.4 4.4  CL 104 105  CO2 27 28  GLUCOSE 195* 63*  BUN 36*  36*  CREATININE 1.65* 1.69*  CALCIUM 8.7* 8.4*   GFR: CrCl cannot be calculated (Unknown ideal weight.). Liver Function Tests: No results for input(s): AST, ALT, ALKPHOS, BILITOT, PROT, ALBUMIN in the last 168 hours. No results for input(s): LIPASE, AMYLASE in the last 168 hours. No results for input(s): AMMONIA in the last 168 hours. Coagulation Profile: No results for input(s): INR, PROTIME in the last 168  hours. Cardiac Enzymes: Recent Labs  Lab 05/06/18 1248  TROPONINI <0.03   BNP (last 3 results) No results for input(s): PROBNP in the last 8760 hours. HbA1C: No results for input(s): HGBA1C in the last 72 hours. CBG: Recent Labs  Lab 05/07/18 1145 05/07/18 1621 05/07/18 2109 05/08/18 0804 05/08/18 1150  GLUCAP 149* 180* 217* 129* 142*   Lipid Profile: No results for input(s): CHOL, HDL, LDLCALC, TRIG, CHOLHDL, LDLDIRECT in the last 72 hours. Thyroid Function Tests: Recent Labs    05/06/18 2238  TSH 2.026   Anemia Panel: No results for input(s): VITAMINB12, FOLATE, FERRITIN, TIBC, IRON, RETICCTPCT in the last 72 hours. Urine analysis:    Component Value Date/Time   COLORURINE YELLOW 06/30/2016 1416   APPEARANCEUR TURBID (A) 06/30/2016 1416   LABSPEC 1.024 06/30/2016 1416   PHURINE 5.0 06/30/2016 1416   GLUCOSEU 500 (A) 06/30/2016 1416   HGBUR MODERATE (A) 06/30/2016 1416   BILIRUBINUR NEGATIVE 06/30/2016 1416   KETONESUR NEGATIVE 06/30/2016 1416   PROTEINUR 100 (A) 06/30/2016 1416   UROBILINOGEN 1.0 03/26/2014 2102   NITRITE NEGATIVE 06/30/2016 1416   LEUKOCYTESUR LARGE (A) 06/30/2016 1416   Sepsis Labs: @LABRCNTIP (procalcitonin:4,lacticidven:4)  ) Recent Results (from the past 240 hour(s))  MRSA PCR Screening     Status: None   Collection Time: 05/07/18  4:26 AM  Result Value Ref Range Status   MRSA by PCR NEGATIVE NEGATIVE Final    Comment:        The GeneXpert MRSA Assay (FDA approved for NASAL specimens only), is one component of a comprehensive MRSA colonization surveillance program. It is not intended to diagnose MRSA infection nor to guide or monitor treatment for MRSA infections. Performed at [facility], [place] [address] [place], [place] [address]       Studies: No results found.  Scheduled Meds: . apixaban  2.5 mg Oral BID  . carvedilol  12.5 mg Oral BID  . cholecalciferol  1,000 Units Oral Daily  . clopidogrel  75 mg Oral  Daily  . furosemide  80 mg Intravenous TID  . insulin aspart  0-5 Units Subcutaneous QHS  . insulin aspart  0-9 Units Subcutaneous TID WC  . ipratropium-albuterol  3 mL Nebulization Q6H  . pravastatin  80 mg Oral q1800    Continuous Infusions: . amiodarone 30 mg/hr (05/08/18 0625)     LOS: 2 days     Kayleen Memos, MD [facility] Pager [phone]  If 7PM-7AM, please contact night-coverage [email or web address] Password [facility] 05/08/2018, 3:41 PM

## 2018-05-08 NOTE — Plan of Care (Signed)
  Problem: Education: Goal: Knowledge of General Education information will improve Outcome: Progressing   

## 2018-05-08 NOTE — H&P (View-Only) (Signed)
PROGRESS NOTE  Dustin Leonard. IHK:[id number] DOB: [birth year] DOA: 05/06/2018 PCP: Katherina Mires, MD  HPI/Recap of past 24 hours:  84 y.o.malewith medical history significantfor COPD not on home oxygen, CVA 2012, cardiomyopathy known left bundle branch block, asthma, tobacco use, prostate cancer, diabetes, paroxysmal A. Fib, heart failure  emergency department with the chief complaint worsening shortness of breath. Initial evaluation reveals acute respiratory failure likely with multiple etiologies specifically acute on chronic CHF, COPD exacerbation and A. Fib with RVR. [facility] are asked to admit  05/08/2018: Patient seen and examined at his bedside.  No new complaints but has audible wheezes.  Denies chest pain.   Assessment/Plan: Principal Problem:   Acute respiratory failure (HCC) Active Problems:   Cardiomyopathy, ischemic   Atrial flutter with rapid ventricular response (HCC)   Acute exacerbation of chronic obstructive pulmonary disease (COPD) (HCC)   Acute on chronic combined systolic and diastolic CHF (congestive heart failure) ([place])   Poorly controlled type 2 diabetes mellitus with renal complication (HCC)   Chronic kidney disease   Atrial fibrillation with RVR (HCC)   Palliative care encounter  Acute hypoxic respiratory failure mostly secondary to acute COPD exacerbation Continue breathing treatments around-the-clock Continue to supply O2 to maintain O2 saturation between 88 and 92%  A. fib/a flutter with RVR On amiodarone IV Cardiology following Started Eliquis for CVA prophylaxis  Chronic systolic CHF Last 2D echo with EF of 40 to 45% in 2018 Continue current medications  AKI on CKD 3 Baseline creatinine appears to be 1.3 Creatinine 1.69 Avoid nephrotoxic agents/hypotension Repeat BMP in the morning  Hyperlipidemia Continue pravastatin     Code Status: DNR  Family Communication: None at bedside  Disposition Plan: Home in 2 to 3  days when cardiology signs of   Consultants:  Cardiology  Procedures:  None  Antimicrobials:  None  DVT prophylaxis: Eliquis   Objective: Vitals:   05/08/18 0911 05/08/18 1153 05/08/18 1354 05/08/18 1437  BP: 116/87 103/72    Pulse: 88 100 97 (!) 108  Resp: (!) 24 (!) 22 (!) 21   Temp:  97.9 F (36.6 C)    TempSrc:  Oral    SpO2: 100% 100% 100% 92%  Weight:        Intake/Output Summary (Last 24 hours) at 05/08/2018 1541 Last data filed at 05/08/2018 1500 Gross per 24 hour  Intake 1826.5 ml  Output 2375 ml  Net -548.5 ml   Filed Weights   05/06/18 2130 05/07/18 0447 05/08/18 0454  Weight: 94 kg (207 lb 3.7 oz) 93.6 kg (206 lb 5.6 oz) 94.2 kg (207 lb 10.8 oz)    Exam:  . General: 84 y.o. year-old male well developed well nourished in no acute distress.  Somnolent but easily arousable to voices.. . Cardiovascular: Regular rate and rhythm with no rubs or gallops.  No thyromegaly or JVD noted.  2+ pitting edema in lower extremities bilaterally. Marland Kitchen Respiratory: Diffuse wheezes bilaterally.  Poor inspiratory effort.. . Abdomen: Soft nontender nondistended with normal bowel sounds x4 quadrants. . Psychiatry: Unable to assess mood due to somnolence.   Data Reviewed: CBC: Recent Labs  Lab 05/06/18 1248 05/07/18 0637  WBC 9.1 9.1  HGB 12.9* 12.0*  HCT 41.4 38.7*  MCV 93.2 92.1  PLT 129* 756*   Basic Metabolic Panel: Recent Labs  Lab 05/06/18 1248 05/07/18 0637  NA 140 140  K 4.4 4.4  CL 104 105  CO2 27 28  GLUCOSE 195* 63*  BUN 36*  36*  CREATININE 1.65* 1.69*  CALCIUM 8.7* 8.4*   GFR: CrCl cannot be calculated (Unknown ideal weight.). Liver Function Tests: No results for input(s): AST, ALT, ALKPHOS, BILITOT, PROT, ALBUMIN in the last 168 hours. No results for input(s): LIPASE, AMYLASE in the last 168 hours. No results for input(s): AMMONIA in the last 168 hours. Coagulation Profile: No results for input(s): INR, PROTIME in the last 168  hours. Cardiac Enzymes: Recent Labs  Lab 05/06/18 1248  TROPONINI <0.03   BNP (last 3 results) No results for input(s): PROBNP in the last 8760 hours. HbA1C: No results for input(s): HGBA1C in the last 72 hours. CBG: Recent Labs  Lab 05/07/18 1145 05/07/18 1621 05/07/18 2109 05/08/18 0804 05/08/18 1150  GLUCAP 149* 180* 217* 129* 142*   Lipid Profile: No results for input(s): CHOL, HDL, LDLCALC, TRIG, CHOLHDL, LDLDIRECT in the last 72 hours. Thyroid Function Tests: Recent Labs    05/06/18 2238  TSH 2.026   Anemia Panel: No results for input(s): VITAMINB12, FOLATE, FERRITIN, TIBC, IRON, RETICCTPCT in the last 72 hours. Urine analysis:    Component Value Date/Time   COLORURINE YELLOW 06/30/2016 1416   APPEARANCEUR TURBID (A) 06/30/2016 1416   LABSPEC 1.024 06/30/2016 1416   PHURINE 5.0 06/30/2016 1416   GLUCOSEU 500 (A) 06/30/2016 1416   HGBUR MODERATE (A) 06/30/2016 1416   BILIRUBINUR NEGATIVE 06/30/2016 1416   KETONESUR NEGATIVE 06/30/2016 1416   PROTEINUR 100 (A) 06/30/2016 1416   UROBILINOGEN 1.0 03/26/2014 2102   NITRITE NEGATIVE 06/30/2016 1416   LEUKOCYTESUR LARGE (A) 06/30/2016 1416   Sepsis Labs: @LABRCNTIP (procalcitonin:4,lacticidven:4)  ) Recent Results (from the past 240 hour(s))  MRSA PCR Screening     Status: None   Collection Time: 05/07/18  4:26 AM  Result Value Ref Range Status   MRSA by PCR NEGATIVE NEGATIVE Final    Comment:        The GeneXpert MRSA Assay (FDA approved for NASAL specimens only), is one component of a comprehensive MRSA colonization surveillance program. It is not intended to diagnose MRSA infection nor to guide or monitor treatment for MRSA infections. Performed at [facility], [place] [address] [place],  [address]       Studies: No results found.  Scheduled Meds: . apixaban  2.5 mg Oral BID  . carvedilol  12.5 mg Oral BID  . cholecalciferol  1,000 Units Oral Daily  . clopidogrel  75 mg Oral  Daily  . furosemide  80 mg Intravenous TID  . insulin aspart  0-5 Units Subcutaneous QHS  . insulin aspart  0-9 Units Subcutaneous TID WC  . ipratropium-albuterol  3 mL Nebulization Q6H  . pravastatin  80 mg Oral q1800    Continuous Infusions: . amiodarone 30 mg/hr (05/08/18 0625)     LOS: 2 days     Kayleen Memos, MD [facility] Pager [phone]  If 7PM-7AM, please contact night-coverage [email or web address] Password [facility] 05/08/2018, 3:41 PM

## 2018-05-08 NOTE — Progress Notes (Signed)
   05/08/18 1100  Clinical Encounter Type  Visited With Patient and family together;Health care provider  Visit Type Initial  Referral From Nurse;Patient  Consult/Referral To Chaplain  Spiritual Encounters  Spiritual Needs Emotional;Prayer  Stress Factors  Patient Stress Factors Health changes   Responded to a SCC for prayer.  Patient was being moved from the bed to a chair.  One of his children, daughter was present.  Once situated the daughter stepped out for a few minutes.  The patient shared some about his faith story and he and his wife have been married 95 years.  One of their daughters died about 3-4 years ago.  He lost his ability to see 3-4 years ago.  He states it is hard to rely on other people for what he needs.  He expressed his real desire to get back to his home and his wife.  He really hopes that can happen soon, he began to cry in expressing this desire.  We prayed together.  Will follow and support as needed. Chaplain Katherene Ponto

## 2018-05-09 ENCOUNTER — Encounter (HOSPITAL_COMMUNITY)
Admission: EM | Disposition: A | Discharge status: Home / Self Care | Payer: Self-pay | Source: Home / Self Care | Attending: Internal Medicine

## 2018-05-09 ENCOUNTER — Other Ambulatory Visit: Payer: Self-pay

## 2018-05-09 ENCOUNTER — Encounter (HOSPITAL_COMMUNITY): Payer: Self-pay

## 2018-05-09 ENCOUNTER — Inpatient Hospital Stay (HOSPITAL_COMMUNITY): Payer: Medicare Other | Admitting: Certified Registered"

## 2018-05-09 ENCOUNTER — Inpatient Hospital Stay (HOSPITAL_COMMUNITY): Payer: Medicare Other

## 2018-05-09 DIAGNOSIS — I509 Heart failure, unspecified: Secondary | ICD-10-CM

## 2018-05-09 DIAGNOSIS — I4891 Unspecified atrial fibrillation: Secondary | ICD-10-CM

## 2018-05-09 DIAGNOSIS — I340 Nonrheumatic mitral (valve) insufficiency: Secondary | ICD-10-CM

## 2018-05-09 DIAGNOSIS — I351 Nonrheumatic aortic (valve) insufficiency: Secondary | ICD-10-CM

## 2018-05-09 HISTORY — PX: CARDIOVERSION: SHX1299

## 2018-05-09 HISTORY — PX: TEE WITHOUT CARDIOVERSION: SHX5443

## 2018-05-09 LAB — GLUCOSE, CAPILLARY
GLUCOSE-CAPILLARY: 167 mg/dL — AB (ref 70–99)
Glucose-Capillary: 192 mg/dL — ABNORMAL HIGH (ref 70–99)
Glucose-Capillary: 243 mg/dL — ABNORMAL HIGH (ref 70–99)
Glucose-Capillary: 245 mg/dL — ABNORMAL HIGH (ref 70–99)

## 2018-05-09 LAB — BASIC METABOLIC PANEL
Anion gap: 10 (ref 5–15)
BUN: 36 mg/dL — ABNORMAL HIGH (ref 8–23)
CO2: 28 mmol/L (ref 22–32)
Calcium: 8.2 mg/dL — ABNORMAL LOW (ref 8.9–10.3)
Chloride: 97 mmol/L — ABNORMAL LOW (ref 98–111)
Creatinine, Ser: 1.72 mg/dL — ABNORMAL HIGH (ref 0.61–1.24)
GFR calc Af Amer: 39 mL/min — ABNORMAL LOW (ref >60)
GFR calc non Af Amer: 34 mL/min — ABNORMAL LOW (ref >60)
Glucose, Bld: 179 mg/dL — ABNORMAL HIGH (ref 70–99)
Potassium: 3.9 mmol/L (ref 3.5–5.1)
Sodium: 135 mmol/L (ref 135–145)

## 2018-05-09 LAB — CBC
HCT: 39.3 % (ref 39.0–52.0)
HEMOGLOBIN: 12.4 g/dL — AB (ref 13.0–17.0)
MCH: 28.8 pg (ref 26.0–34.0)
MCHC: 31.6 g/dL (ref 30.0–36.0)
MCV: 91.4 fL (ref 78.0–100.0)
Platelets: 112 10*3/uL — ABNORMAL LOW (ref 150–400)
RBC: 4.3 MIL/uL (ref 4.22–5.81)
RDW: 13.6 % (ref 11.5–15.5)
WBC: 8.6 10*3/uL (ref 4.0–10.5)

## 2018-05-09 SURGERY — ECHOCARDIOGRAM, TRANSESOPHAGEAL
Anesthesia: General

## 2018-05-09 MED ORDER — LACTATED RINGERS IV SOLN
INTRAVENOUS | Status: DC | PRN
  Administered 2018-05-09: 09:00:00 via INTRAVENOUS

## 2018-05-09 MED ORDER — LACTATED RINGERS IV SOLN
INTRAVENOUS | Status: AC | PRN
  Administered 2018-05-09: 1000 mL via INTRAVENOUS

## 2018-05-09 MED ORDER — PHENYLEPHRINE HCL 10 MG/ML IJ SOLN
INTRAMUSCULAR | Status: DC | PRN
  Administered 2018-05-09 (×3): 80 ug via INTRAVENOUS
  Administered 2018-05-09 (×2): 120 ug via INTRAVENOUS
  Administered 2018-05-09: 80 ug via INTRAVENOUS
  Administered 2018-05-09: 120 ug via INTRAVENOUS

## 2018-05-09 MED ORDER — PROPOFOL 500 MG/50ML IV EMUL
INTRAVENOUS | Status: DC | PRN
  Administered 2018-05-09: 100 ug/kg/min via INTRAVENOUS

## 2018-05-09 MED ORDER — LIDOCAINE HCL (CARDIAC) PF 100 MG/5ML IV SOSY
PREFILLED_SYRINGE | INTRAVENOUS | Status: DC | PRN
  Administered 2018-05-09: 40 mg via INTRAVENOUS

## 2018-05-09 MED ORDER — AMIODARONE HCL 200 MG PO TABS
400.0000 mg | ORAL_TABLET | Freq: Every day | ORAL | Status: DC
  Administered 2018-05-09 – 2018-05-10 (×2): 400 mg via ORAL
  Filled 2018-05-09 (×2): qty 2

## 2018-05-09 MED ORDER — IPRATROPIUM-ALBUTEROL 0.5-2.5 (3) MG/3ML IN SOLN
3.0000 mL | Freq: Three times a day (TID) | RESPIRATORY_TRACT | Status: DC
  Administered 2018-05-10: 3 mL via RESPIRATORY_TRACT
  Filled 2018-05-09: qty 3

## 2018-05-09 NOTE — Anesthesia Postprocedure Evaluation (Signed)
Anesthesia Post Note  Patient: Dustin Leonard.  Procedure(s) Performed: TRANSESOPHAGEAL ECHOCARDIOGRAM (TEE) (N/A ) CARDIOVERSION (N/A )     Patient location during evaluation: PACU Anesthesia Type: General Level of consciousness: awake and alert Pain management: pain level controlled Vital Signs Assessment: post-procedure vital signs reviewed and stable Respiratory status: spontaneous breathing, nonlabored ventilation, respiratory function stable and patient connected to nasal cannula oxygen Cardiovascular status: blood pressure returned to baseline and stable Postop Assessment: no apparent nausea or vomiting Anesthetic complications: no    Last Vitals:  Vitals:   05/09/18 0945 05/09/18 0951  BP: (!) 87/44 (!) 95/49  Pulse: (!) 57 (!) 58  Resp: 15 17  Temp:    SpO2: 100% 99%    Last Pain:  Vitals:   05/09/18 0951  TempSrc:   PainSc: 0-No pain                 [name] S

## 2018-05-09 NOTE — Progress Notes (Signed)
DAILY PROGRESS NOTE   Patient Name: Dustin Leonard. Date of Encounter: 05/09/2018  Chief Complaint   Breathing better today  Patient Profile   Maureen Duesing. is a 84 y.o. male with a history of hx of COPD, CVA 2012, and 2 prior strokes total), PA flutter  Cardiomyopathy dx at time of CVA, (no further work up) known LBBB, asthma, 20-30 yrs of tobacco use, prostate CA, DM and now presents in a fib with RVR.   Pt being seen today for the evaluation of acute systolic HF and a fib with RVR at the request of Dr. Tyrone Nine  Subjective   Diuresed another 2.6L negative overnight. TEE today showed LVEF 30-35%. Successful DCCV - BP improved post-cardioversion. Says his breathing is better today - less wheezy.  Objective   Vitals:   05/09/18 0945 05/09/18 0951 05/09/18 1005 05/09/18 1014  BP: (!) 87/44 (!) 95/49 110/66 (!) 115/57  Pulse: (!) 57 (!) 58 60 66  Resp: 15 17 (!) 22 (!) 25  Temp:      TempSrc:      SpO2: 100% 99% 100% 100%  Weight:        Intake/Output Summary (Last 24 hours) at 05/09/2018 1035 Last data filed at 05/09/2018 6384 Gross per 24 hour  Intake 1203.67 ml  Output 3825 ml  Net -2621.33 ml   Filed Weights   05/07/18 0447 05/08/18 0454 05/09/18 0400  Weight: 206 lb 5.6 oz (93.6 kg) 207 lb 10.8 oz (94.2 kg) 201 lb 6.4 oz (91.4 kg)    Physical Exam   General appearance: alert, no distress and off oxygen Lungs: diminished breath sounds bilaterally and wheezes faint expiratory Heart: regular rate and rhythm Extremities: edema trace LE edema Neurologic: Mental status: Alert, oriented, thought content appropriate  Inpatient Medications    Scheduled Meds: . apixaban  2.5 mg Oral BID  . carvedilol  12.5 mg Oral BID  . cholecalciferol  1,000 Units Oral Daily  . clopidogrel  75 mg Oral Daily  . furosemide  80 mg Intravenous TID  . insulin aspart  0-5 Units Subcutaneous QHS  . insulin aspart  0-9 Units Subcutaneous TID WC  . ipratropium-albuterol  3 mL  Nebulization Q6H  . pravastatin  80 mg Oral q1800    Continuous Infusions: . amiodarone 30 mg/hr (05/08/18 1914)    PRN Meds: ondansetron (ZOFRAN) IV   Labs   Results for orders placed or performed during the hospital encounter of 05/06/18 (from the past 48 hour(s))  Glucose, capillary     Status: Abnormal   Collection Time: 05/07/18 11:45 AM  Result Value Ref Range   Glucose-Capillary 149 (H) 70 - 99 mg/dL  Glucose, capillary     Status: Abnormal   Collection Time: 05/07/18  4:21 PM  Result Value Ref Range   Glucose-Capillary 180 (H) 70 - 99 mg/dL  Glucose, capillary     Status: Abnormal   Collection Time: 05/07/18  9:09 PM  Result Value Ref Range   Glucose-Capillary 217 (H) 70 - 99 mg/dL  Glucose, capillary     Status: Abnormal   Collection Time: 05/08/18  8:04 AM  Result Value Ref Range   Glucose-Capillary 129 (H) 70 - 99 mg/dL  Glucose, capillary     Status: Abnormal   Collection Time: 05/08/18 11:50 AM  Result Value Ref Range   Glucose-Capillary 142 (H) 70 - 99 mg/dL  Glucose, capillary     Status: Abnormal   Collection Time: 05/08/18  4:41 PM  Result Value Ref Range   Glucose-Capillary 162 (H) 70 - 99 mg/dL  Glucose, capillary     Status: Abnormal   Collection Time: 05/08/18  8:21 PM  Result Value Ref Range   Glucose-Capillary 251 (H) 70 - 99 mg/dL  Basic metabolic panel     Status: Abnormal   Collection Time: 05/09/18  3:51 AM  Result Value Ref Range   Sodium 135 135 - 145 mmol/L   Potassium 3.9 3.5 - 5.1 mmol/L   Chloride 97 (L) 98 - 111 mmol/L    Comment: Please note change in reference range.   CO2 28 22 - 32 mmol/L   Glucose, Bld 179 (H) 70 - 99 mg/dL    Comment: Please note change in reference range.   BUN 36 (H) 8 - 23 mg/dL    Comment: Please note change in reference range.   Creatinine, Ser 1.72 (H) 0.61 - 1.24 mg/dL   Calcium 8.2 (L) 8.9 - 10.3 mg/dL   GFR calc non Af Amer 34 (L) >60 mL/min   GFR calc Af Amer 39 (L) >60 mL/min    Comment:  (NOTE) The eGFR has been calculated using the CKD EPI equation. This calculation has not been validated in all clinical situations. eGFR's persistently <60 mL/min signify possible Chronic Kidney Disease.    Anion gap 10 5 - 15    Comment: Performed at [place] [address], [place], [place] [address]  CBC     Status: Abnormal   Collection Time: 05/09/18  3:51 AM  Result Value Ref Range   WBC 8.6 4.0 - 10.5 K/uL   RBC 4.30 4.22 - 5.81 MIL/uL   Hemoglobin 12.4 (L) 13.0 - 17.0 g/dL   HCT 39.3 39.0 - 52.0 %   MCV 91.4 78.0 - 100.0 fL   MCH 28.8 26.0 - 34.0 pg   MCHC 31.6 30.0 - 36.0 g/dL   RDW 13.6 11.5 - 15.5 %   Platelets 112 (L) 150 - 400 K/uL    Comment: REPEATED TO VERIFY PLATELET COUNT CONFIRMED BY SMEAR Performed at [facility], [place] [address], [place], [place] [address]   Glucose, capillary     Status: Abnormal   Collection Time: 05/09/18  7:56 AM  Result Value Ref Range   Glucose-Capillary 167 (H) 70 - 99 mg/dL    ECG   N/A  Telemetry   Sinus rhythm - Personally Reviewed  Radiology    No results found.  Cardiac Studies   LV EF: 30% -   35%  ------------------------------------------------------------------- Indications:      Atrial fibrillation - 427.31.  ------------------------------------------------------------------- History:   PMH:   Stroke.  Chronic obstructive pulmonary disease. PMH:  Left Bundle Branch Block.  Risk factors:  Former tobacco use. Diabetes mellitus.  ------------------------------------------------------------------- Study Conclusions  - Left ventricle: Systolic function was moderately to severely   reduced. The estimated ejection fraction was in the range of 30%   to 35%. Diffuse hypokinesis and paradoxical septal motion. - Aortic valve: There was mild regurgitation. - Mitral valve: There was mild regurgitation. - Left atrium: The atrium was dilated. No evidence of thrombus in   the atrial cavity or  appendage. No evidence of thrombus in the   atrial cavity or appendage. - Right atrium: No evidence of thrombus in the atrial cavity or   appendage. - Atrial septum: No defect or patent foramen ovale was identified.  Impressions:  - Smoke seen in the left atrial appendage but  no thrombus was   indentified. TEE was followed by a successful cardioversion.  Assessment   Principal Problem:   Acute respiratory failure (HCC) Active Problems:   Cardiomyopathy, ischemic   Atrial flutter with rapid ventricular response (HCC)   Acute exacerbation of chronic obstructive pulmonary disease (COPD) (HCC)   Acute on chronic combined systolic and diastolic CHF (congestive heart failure) ([place])   Poorly controlled type 2 diabetes mellitus with renal complication (HCC)   Chronic kidney disease   Atrial fibrillation with RVR ([place])   Palliative care encounter   Plan   1. Mr. Poole had successful DCCV this am - LVEF 30-35%. Excellent diuresis overnight. Off oxygen and breathing improved. Minimal expiratory wheeze today. Creatinine essentially stable. Weight down to 201 lb (from 207). Would continue IV diuretics today and likely switch to po tomorrow. D/c amiodarone gtts and switch to po amiodarone today. Continue to mobilize with PT - address possible home health needs. Possible d/c tomorrow if rhythm is stable overnight. Continue Eliquis.  Time Spent Directly with Patient:  I have spent a total of 25 minutes with the patient reviewing hospital notes, telemetry, EKGs, labs and examining the patient as well as establishing an assessment and plan that was discussed personally with the patient.  > 50% of time was spent in direct patient care.  Length of Stay:  LOS: 3 days   Pixie Casino, MD, [facility], [place] Director of the Advanced Lipid Disorders &  Cardiovascular Risk Reduction Clinic Diplomate of the American Board of Clinical Lipidology Attending  Cardiologist  Direct Dial: [phone]  Fax: [phone]  Website:  [email or web address] Osgood Garik Diamant 05/09/2018, 10:35 AM

## 2018-05-09 NOTE — Progress Notes (Signed)
Checked on Dustin Leonard.  His procedure was successful and he reports he is comfortable.  I talked with [name] (dtr) on the phone.  Both patient and daughter are looking forward to his discharge home.  [name] politely refuses physical therapy.  She states "I'm there every day and I get him up".  He appears to have excellent support at home.  His daughter is understanding of his health conditions.     He is not yet eligible for hospice services in the home as he is eating well, breathing well and out of bed with an EF of greater than 15%.  Palliative will sign off for now.   Please call us back if the patient declines or if we can be of assistance.  Florentina Jenny, PA-C Palliative Medicine Pager: [phone]  15 min.

## 2018-05-09 NOTE — Anesthesia Preprocedure Evaluation (Signed)
Anesthesia Evaluation  Patient identified by MRN, date of birth, ID band Patient awake    Reviewed: Allergy & Precautions, NPO status , Patient's Chart, lab work & pertinent test results  Airway Mallampati: II  TM Distance: >3 FB Neck ROM: Full    Dental no notable dental hx.    Pulmonary COPD, former smoker,    Pulmonary exam normal breath sounds clear to auscultation       Cardiovascular +CHF  Normal cardiovascular exam+ dysrhythmias Atrial Fibrillation  Rhythm:Irregular Rate:Normal     Neuro/Psych CVA negative psych ROS   GI/Hepatic negative GI ROS, Neg liver ROS,   Endo/Other  diabetes  Renal/GU negative Renal ROS  negative genitourinary   Musculoskeletal negative musculoskeletal ROS (+)   Abdominal   Peds negative pediatric ROS (+)  Hematology negative hematology ROS (+)   Anesthesia Other Findings   Reproductive/Obstetrics negative OB ROS                             Anesthesia Physical Anesthesia Plan  ASA: III  Anesthesia Plan: General   Post-op Pain Management:    Induction: Intravenous  PONV Risk Score and Plan: 0  Airway Management Planned: Mask  Additional Equipment:   Intra-op Plan:   Post-operative Plan:   Informed Consent: I have reviewed the patients History and Physical, chart, labs and discussed the procedure including the risks, benefits and alternatives for the proposed anesthesia with the patient or authorized representative who has indicated his/her understanding and acceptance.   Dental advisory given  Plan Discussed with: CRNA and Surgeon  Anesthesia Plan Comments: (MAC  If TEE only, )        Anesthesia Quick Evaluation

## 2018-05-09 NOTE — Progress Notes (Signed)
PT Cancellation Note  Patient Details Name: Dustin Leonard. MRN: [id number] DOB: 1929-05-24   Cancelled Treatment:    Reason Eval/Treat Not Completed: Patient at procedure or test/unavailable(Pt currently in Endo for procedure. Will check back as able.)   Denice Paradise 05/09/2018, 8:30 AM [facility] [phone] (pager)

## 2018-05-09 NOTE — Progress Notes (Addendum)
PROGRESS NOTE  [name] JGO:[id number] DOB: 1929/05/27 DOA: 05/06/2018 PCP: Katherina Mires, MD  HPI/Recap of past 24 hours:  84 y.o.malewith medical history significantfor COPD not on home oxygen, CVA 2012, cardiomyopathy, known left bundle branch block, asthma, prostate cancer, diabetes, paroxysmal A. Fib, chronic systolic CHF who presents to emergency department from home with the chief complaint worsening shortness of breath. Initial evaluation reveals acute respiratory failure likely with multiple etiologies specifically acute on chronic systolic CHF, COPD exacerbation, and A. Fib with RVR. [facility] asked to admit.  Cardiology consulted and followed.  A. fib RVR and hypoxia resolved.  05/09/2018: Patient seen and examined at his bedside.  He is breathing comfortably at room air and saturating between 98 and 100%.  Cardioversion done successfully today by Dr. Meda Coffee.  Amiodarone drip switched to p.o.   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 fib/a flutter with RVR, resolved Post cardioversion on 05/09/2018 Amiodarone IV switched to po Cardiology following C/w Eliquis for CVA prophylaxis  Acute on Chronic systolic CHF Last 2D echo with EF of 40 to 45% in 2018 Continue current medications TEE done on  05/09/18 for cardioversion revealed EF 30-35% with diffuse hypokinesis and paradoxical septal motion Continue cardiac medications Continue strict I's and O's Continue daily weight  AKI on CKD 3, persistent Baseline creatinine appears to be 1.3 Creatinine 1.72 from 1.69 Repeat BMP in the morning  Hyperlipidemia Continue pravastatin     Code Status: DNR  Family Communication: None at bedside  Disposition Plan: Possibly tomorrow 05/10/2018 or when cardiology signs off.   Consultants:  Cardiology  Procedures:  None  Antimicrobials:  None  DVT prophylaxis: Eliquis   Objective: Vitals:   05/09/18 1014 05/09/18 1120 05/09/18 1357 05/09/18 1644  BP: (!) 115/57 113/69  114/64  Pulse: 66 65  64  Resp: (!) 25 (!) 22  17  Temp:  97.6 F (36.4 C)  98.5 F (36.9 C)  TempSrc:  Oral  Oral  SpO2: 100% 100% 99% 100%  Weight:        Intake/Output Summary (Last 24 hours) at 05/09/2018 1656 Last data filed at 05/09/2018 5597 Gross per 24 hour  Intake 797 ml  Output 2375 ml  Net -1578 ml   Filed Weights   05/07/18 0447 05/08/18 0454 05/09/18 0400  Weight: 93.6 kg (206 lb 5.6 oz) 94.2 kg (207 lb 10.8 oz) 91.4 kg (201 lb 6.4 oz)    Exam:  . General: 84 y.o. year-old male well-developed well-nourished in no acute distress.  Alert but minimally talkative . Cardiovascular: Regular rate and rhythm with no rubs or gallops.  No JVD or thyromegaly.  1+ pitting edema in lower extremities bilaterally. Marland Kitchen Respiratory: Mild rales at bases.  Poor inspiratory effort. . Abdomen: Soft nontender nondistended with normal bowel sounds x4 quadrants. . Psychiatry: Mood is appropriate for condition and setting.  Data Reviewed: CBC: Recent Labs  Lab 05/06/18 1248 05/07/18 0637 05/09/18 0351  WBC 9.1 9.1 8.6  HGB 12.9* 12.0* 12.4*  HCT 41.4 38.7* 39.3  MCV 93.2 92.1 91.4  PLT 129* 131* 568*   Basic Metabolic Panel: Recent Labs  Lab 05/06/18 1248 05/07/18 0637 05/09/18 0351    NA 140 140 135  K 4.4 4.4 3.9  CL 104 105 97*  CO2 27 28 28   GLUCOSE 195* 63* 179*  BUN 36* 36* 36*  CREATININE 1.65* 1.69* 1.72*  CALCIUM 8.7* 8.4* 8.2*   GFR: CrCl cannot be calculated (Unknown ideal weight.). Liver Function Tests: No results for input(s): AST, ALT, ALKPHOS, BILITOT, PROT, ALBUMIN in the last 168 hours. No results for input(s): LIPASE, AMYLASE in the last 168 hours. No results for input(s): AMMONIA in the last 168 hours. Coagulation Profile: No results for input(s): INR, PROTIME in the last 168 hours. Cardiac Enzymes: Recent Labs  Lab 05/06/18 1248  TROPONINI <0.03   BNP (last 3 results) No results for input(s): PROBNP in the last 8760 hours. HbA1C: No results for input(s): HGBA1C in the last 72 hours. CBG: Recent Labs  Lab 05/08/18 1641 05/08/18 2021 05/09/18 0756 05/09/18 1117 05/09/18 1643  GLUCAP 162* 251* 167* 192* 243*   Lipid Profile: No results for input(s): CHOL, HDL, LDLCALC, TRIG, CHOLHDL, LDLDIRECT in the last 72 hours. Thyroid Function Tests: Recent Labs    05/06/18 2238  TSH 2.026   Anemia Panel: No results for input(s): VITAMINB12, FOLATE, FERRITIN, TIBC, IRON, RETICCTPCT in the last 72 hours. Urine analysis:    Component Value Date/Time   COLORURINE YELLOW 06/30/2016 1416   APPEARANCEUR TURBID (A) 06/30/2016 1416   LABSPEC 1.024 06/30/2016 1416   PHURINE 5.0 06/30/2016 1416   GLUCOSEU 500 (A) 06/30/2016 1416   HGBUR MODERATE (A) 06/30/2016 1416   BILIRUBINUR NEGATIVE 06/30/2016 1416   KETONESUR NEGATIVE 06/30/2016 1416   PROTEINUR 100 (A) 06/30/2016 1416   UROBILINOGEN 1.0 03/26/2014 2102   NITRITE NEGATIVE 06/30/2016 1416   LEUKOCYTESUR LARGE (A) 06/30/2016 1416   Sepsis Labs: @LABRCNTIP (procalcitonin:4,lacticidven:4)  ) Recent Results (from the past 240 hour(s))  MRSA PCR Screening     Status: None   Collection Time: 05/07/18  4:26 AM  Result Value Ref Range Status   MRSA by PCR NEGATIVE NEGATIVE Final     Comment:        The GeneXpert MRSA Assay (FDA approved for NASAL specimens only), is one component of a comprehensive MRSA colonization surveillance program. It is not intended to diagnose MRSA infection nor to guide or monitor treatment for MRSA infections. Performed at [facility], [place] [address], [place], [place] [address]       Studies: No results found.  Scheduled Meds: . amiodarone  400 mg Oral Daily  . apixaban  2.5 mg Oral BID  . carvedilol  12.5 mg Oral BID  . cholecalciferol  1,000 Units Oral Daily  . clopidogrel  75 mg Oral Daily  . furosemide  80 mg Intravenous TID  . insulin aspart  0-5 Units Subcutaneous QHS  . insulin aspart  0-9 Units Subcutaneous TID WC  . ipratropium-albuterol  3 mL Nebulization Q6H  . pravastatin  80 mg Oral q1800    Continuous Infusions:    LOS: 3 days     Kayleen Memos, MD [facility] Pager [phone]  If 7PM-7AM, please contact night-coverage [email or web address] Password [facility] 05/09/2018, 4:56 PM

## 2018-05-09 NOTE — Transfer of Care (Signed)
Immediate Anesthesia Transfer of Care Note  Patient: Dustin Leonard.  Procedure(s) Performed: TRANSESOPHAGEAL ECHOCARDIOGRAM (TEE) (N/A ) CARDIOVERSION (N/A )  Patient Location: Endoscopy Unit  Anesthesia Type:MAC  Level of Consciousness: drowsy and patient cooperative  Airway & Oxygen Therapy: Patient Spontanous Breathing and Patient connected to nasal cannula oxygen  Post-op Assessment: Report given to RN, Post -op Vital signs reviewed and stable and Patient moving all extremities X 4  Post vital signs: Reviewed and stable  Last Vitals:  Vitals Value Taken Time  BP    Temp    Pulse    Resp    SpO2      Last Pain:  Vitals:   05/09/18 0828  TempSrc: Oral  PainSc: 0-No pain      Patients Stated Pain Goal: 0 (83/37/44 5146)  Complications: No apparent anesthesia complications

## 2018-05-09 NOTE — Interval H&P Note (Signed)
History and Physical Interval Note:  05/09/2018 7:19 AM  Dustin Leonard.  has presented today for surgery, with the diagnosis of AFIB  The various methods of treatment have been discussed with the patient and family. After consideration of risks, benefits and other options for treatment, the patient has consented to  Procedure(s): TRANSESOPHAGEAL ECHOCARDIOGRAM (TEE) (N/A) CARDIOVERSION (N/A) as a surgical intervention .  The patient's history has been reviewed, patient examined, no change in status, stable for surgery.  I have reviewed the patient's chart and labs.  Questions were answered to the patient's satisfaction.     [name]

## 2018-05-09 NOTE — Plan of Care (Signed)
  Problem: Clinical Measurements: Goal: Ability to maintain clinical measurements within normal limits will improve Outcome: Progressing   

## 2018-05-09 NOTE — CV Procedure (Signed)
     Transesophageal Echocardiogram Note  Briceson Broadwater [id number] 1929-01-04  Procedure: Transesophageal Echocardiogram Indications: atrial fibrillation  Procedure Details Consent: Obtained Time Out: Verified patient identification, verified procedure, site/side was marked, verified correct patient position, special equipment/implants available, Radiology Safety Procedures followed,  medications/allergies/relevent history reviewed, required imaging and test results available.  Performed  Medications: Propofol 225 mg administered by anesthesia staff   - Left ventricle: Systolic function was moderately to severely   reduced. The estimated ejection fraction was in the range of 30%   to 35%. Diffuse hypokinesis and paradoxical septal motion. - Aortic valve: There was mild regurgitation. - Mitral valve: There was mild regurgitation. - Left atrium: The atrium was dilated. No evidence of thrombus in   the atrial cavity or appendage. No evidence of thrombus in the   atrial cavity or appendage. - Right atrium: No evidence of thrombus in the atrial cavity or   appendage. - Atrial septum: No defect or patent foramen ovale was identified.  Impressions:  - Smoke seen in the left atrial appendage but no thrombus was   indentified. TEE was followed by a successful cardioversion.  Complications: No apparent complications Patient did tolerate procedure well.  [name], MD, [facility] 05/09/2018, 11:12 AM        Cardioversion Note  Pericles Carmicheal [id number] March 27, 1929  Procedure: DC Cardioversion Indications: atrial fibrillation  Procedure Details Consent: Obtained Time Out: Verified patient identification, verified procedure, site/side was marked, verified correct patient position, special equipment/implants available, Radiology Safety Procedures followed,  medications/allergies/relevent history reviewed, required imaging and test results available.   Performed  The patient has been on adequate anticoagulation.  The patient received IV propofol 225 mg administered by anesthesia staff for sedation.  Synchronous cardioversion was performed at 120 joules.  The cardioversion was successful.  Complications: No apparent complications Patient did tolerate procedure well.   [name], MD, [facility] 05/09/2018, 11:12 AM

## 2018-05-09 NOTE — Progress Notes (Signed)
Call placed to Dr. Meda Coffee, advised BP 75/36 in endoscopy recovery. Advised amiodarone still infusing. Verbal order received for 535mL bolus from bag of LR already hanging, Amiodarone to remain on at this time. She will come to assess. BP at this time with bolus infusing 87/44.

## 2018-05-09 NOTE — Progress Notes (Addendum)
  Echocardiogram Echocardiogram Transesophageal has been performed.  Dustin Leonard G [name] 05/09/2018, 10:07 AM

## 2018-05-10 ENCOUNTER — Encounter (HOSPITAL_COMMUNITY): Payer: Self-pay | Admitting: Cardiology

## 2018-05-10 DIAGNOSIS — M7989 Other specified soft tissue disorders: Secondary | ICD-10-CM

## 2018-05-10 LAB — CBC
HCT: 37.1 % — ABNORMAL LOW (ref 39.0–52.0)
Hemoglobin: 11.8 g/dL — ABNORMAL LOW (ref 13.0–17.0)
MCH: 29.1 pg (ref 26.0–34.0)
MCHC: 31.8 g/dL (ref 30.0–36.0)
MCV: 91.4 fL (ref 78.0–100.0)
Platelets: 97 10*3/uL — ABNORMAL LOW (ref 150–400)
RBC: 4.06 MIL/uL — ABNORMAL LOW (ref 4.22–5.81)
RDW: 13.4 % (ref 11.5–15.5)
WBC: 7.5 10*3/uL (ref 4.0–10.5)

## 2018-05-10 LAB — GLUCOSE, CAPILLARY
GLUCOSE-CAPILLARY: 188 mg/dL — AB (ref 70–99)
Glucose-Capillary: 192 mg/dL — ABNORMAL HIGH (ref 70–99)

## 2018-05-10 LAB — BASIC METABOLIC PANEL
Anion gap: 11 (ref 5–15)
BUN: 31 mg/dL — ABNORMAL HIGH (ref 8–23)
CO2: 30 mmol/L (ref 22–32)
Calcium: 8.2 mg/dL — ABNORMAL LOW (ref 8.9–10.3)
Chloride: 98 mmol/L (ref 98–111)
Creatinine, Ser: 1.73 mg/dL — ABNORMAL HIGH (ref 0.61–1.24)
GFR calc Af Amer: 39 mL/min — ABNORMAL LOW (ref >60)
GFR calc non Af Amer: 34 mL/min — ABNORMAL LOW (ref >60)
Glucose, Bld: 171 mg/dL — ABNORMAL HIGH (ref 70–99)
Potassium: 3.9 mmol/L (ref 3.5–5.1)
Sodium: 139 mmol/L (ref 135–145)

## 2018-05-10 MED ORDER — FUROSEMIDE 80 MG PO TABS: 80.0000 mg | ORAL_TABLET | Freq: Every day | ORAL | 0 refills | Status: DC

## 2018-05-10 MED ORDER — APIXABAN 2.5 MG PO TABS: 2.5000 mg | ORAL_TABLET | Freq: Two times a day (BID) | ORAL | 0 refills | Status: DC

## 2018-05-10 MED ORDER — PANTOPRAZOLE SODIUM 40 MG PO TBEC: 40.0000 mg | DELAYED_RELEASE_TABLET | Freq: Every day | ORAL | 0 refills | Status: DC

## 2018-05-10 MED ORDER — PNEUMOCOCCAL VAC POLYVALENT 25 MCG/0.5ML IJ INJ: 0.5000 mL | INJECTION | INTRAMUSCULAR | Status: DC

## 2018-05-10 MED ORDER — IPRATROPIUM-ALBUTEROL 0.5-2.5 (3) MG/3ML IN SOLN: 3.0000 mL | Freq: Four times a day (QID) | RESPIRATORY_TRACT | Status: DC | PRN

## 2018-05-10 MED ORDER — FUROSEMIDE 80 MG PO TABS
80.0000 mg | ORAL_TABLET | Freq: Every day | ORAL | Status: DC
  Administered 2018-05-10: 80 mg via ORAL
  Filled 2018-05-10: qty 1

## 2018-05-10 MED ORDER — AMIODARONE HCL 200 MG PO TABS: 200.0000 mg | ORAL_TABLET | Freq: Every day | ORAL | 0 refills | Status: DC

## 2018-05-10 MED ORDER — CARVEDILOL 12.5 MG PO TABS: 12.5000 mg | ORAL_TABLET | Freq: Two times a day (BID) | ORAL | 0 refills | Status: DC

## 2018-05-10 NOTE — Discharge Instructions (Signed)
Follow with Primary MD Katherina Mires, MD in 7 days   Get CBC, CMP, 2 view Chest X ray checked  by Primary MD in 5-7 days    Activity: As tolerated with Full fall precautions use walker/cane & assistance as needed  Disposition Home    Diet:   Low carbohydrate - Heart Healthy with strict 1.5 L/day total fluid restriction.  For Heart failure patients - Check your Weight same time everyday, if you gain over 2 pounds, or you develop in leg swelling, experience more shortness of breath or chest pain, call your Primary MD immediately. Follow Cardiac Low Salt Diet and 1.5 lit/day fluid restriction.  Special Instructions: If you have smoked or chewed Tobacco  in the last 2 yrs please stop smoking, stop any regular Alcohol  and or any Recreational drug use.  On your next visit with your primary care physician please Get Medicines reviewed and adjusted.  Please request your Prim.MD to go over all Hospital Tests and Procedure/Radiological results at the follow up, please get all Hospital records sent to your Prim MD by signing hospital release before you go home.  If you experience worsening of your admission symptoms, develop shortness of breath, life threatening emergency, suicidal or homicidal thoughts you must seek medical attention immediately by calling 911 or calling your MD immediately  if symptoms less severe.  You Must read complete instructions/literature along with all the possible adverse reactions/side effects for all the Medicines you take and that have been prescribed to you. Take any new Medicines after you have completely understood and accpet all the possible adverse reactions/side effects.       Information on my medicine - ELIQUIS (apixaban)  This medication education was reviewed with me or my healthcare representative as part of my discharge preparation.  The pharmacist that spoke with me during my hospital stay was:  Ronna Polio, [facility]  Why was Eliquis prescribed for  you? Eliquis was prescribed for you to reduce the risk of a blood clot forming that can cause a stroke if you have a medical condition called atrial fibrillation (a type of irregular heartbeat).  What do You need to know about Eliquis ? Take your Eliquis TWICE DAILY - one tablet in the morning and one tablet in the evening with or without food. If you have difficulty swallowing the tablet whole please discuss with your pharmacist how to take the medication safely.  Take Eliquis exactly as prescribed by your doctor and DO NOT stop taking Eliquis without talking to the doctor who prescribed the medication.  Stopping may increase your risk of developing a stroke.  Refill your prescription before you run out.  After discharge, you should have regular check-up appointments with your healthcare provider that is prescribing your Eliquis.  In the future your dose may need to be changed if your kidney function or weight changes by a significant amount or as you get older.  What do you do if you miss a dose? If you miss a dose, take it as soon as you remember on the same day and resume taking twice daily.  Do not take more than one dose of ELIQUIS at the same time to make up a missed dose.  Important Safety Information A possible side effect of Eliquis is bleeding. You should call your healthcare provider right away if you experience any of the following: ? Bleeding from an injury or your nose that does not stop. ? Unusual colored urine (red or  dark brown) or unusual colored stools (red or black). ? Unusual bruising for unknown reasons. ? A serious fall or if you hit your head (even if there is no bleeding).  Some medicines may interact with Eliquis and might increase your risk of bleeding or clotting while on Eliquis. To help avoid this, consult your healthcare provider or pharmacist prior to using any new prescription or non-prescription medications, including herbals, vitamins, non-steroidal  anti-inflammatory drugs (NSAIDs) and supplements.  This website has more information on Eliquis (apixaban): http://www.eliquis.com/eliquis/home

## 2018-05-10 NOTE — Discharge Summary (Signed)
Dustin Leonard. XTA:[id number] DOB: 11-19-1928 DOA: 05/06/2018  PCP: Katherina Mires, MD  Admit date: 05/06/2018  Discharge date: 05/10/2018  Admitted From: Home  Disposition:  Home ( refused HHPT)   Recommendations for Outpatient Follow-up:   Follow up with PCP in 1-2 weeks  PCP Please obtain BMP/CBC, 2 view CXR in 1week,  (see Discharge instructions)   PCP Please follow up on the following pending results:    Home Health: Refused   Equipment/Devices: None  Consultations: Cards Discharge Condition: Fair   CODE STATUS: DNR   Diet Recommendation:  Heart Healthy - Low Carb   Chief Complaint  Patient presents with  . Asthma     Brief history of present illness from the day of admission and additional interim summary    84 y.o.malewith medical history significantfor COPD not on home oxygen, CVA 2012, cardiomyopathy, known left bundle branch block, asthma, prostate cancer, diabetes, paroxysmal A. Fib, chronic systolic CHF who presents to emergency department from home with the chief complaint worsening shortness of breath. Initial evaluation reveals acute respiratory failure likely with multiple etiologies specifically acute on chronic systolic CHF, COPD exacerbation, and A. Fib with RVR.                                                                 Hospital Course    1. Acute Hypoxic Resp Failure -due to acute on chronic systolic CHF EF 40 to 65% complicated by chronic A. fib and RVR here.  By cardiology, diuresed with IV Lasix, also required cardioversion and currently in sinus, he initially received IV amiodarone along with Coreg, now on p.o. amiodarone, Coreg, low-dose ACE inhibitor along with oral Lasix.  Now close to baseline, still has edema in his legs but lungs are clear, continue oral Lasix,  follow with primary care physician and primary cardiologist in 7 to 10 days for close monitoring of BMP, weight and diuretics.  He is symptom-free now, although he qualified for home health PT and RN he refused both.  His [place] vas 2 score is at least 4.  Continue Eliquis for anticoagulation.   2.  ARF on CKD 4.  Baseline creatinine close to 1.4.  Improved with diuresis.  Close to baseline.  PCP to monitor.  3.  Dyslipidemia.  On statin continue.  4.  Ischemic cardiomyopathy with chronic systolic heart failure EF 40 to 45%.,  Continue Plavix and statin as well for secondary prevention along with medications dictated above.  5.  COPD.  No acute issues.  6.  Hypertension.  Continue home regimen.  7.  DM type II.  Continue home regimen of insulin.    Discharge diagnosis     Principal Problem:   Acute respiratory failure (HCC) Active Problems:   Cardiomyopathy, ischemic   Atrial flutter  with rapid ventricular response (HCC)   Acute exacerbation of chronic obstructive pulmonary disease (COPD) (HCC)   Acute on chronic combined systolic and diastolic CHF (congestive heart failure) ([place])   Poorly controlled type 2 diabetes mellitus with renal complication (HCC)   Chronic kidney disease   Atrial fibrillation with rapid ventricular response ([place])   Palliative care encounter   Acute on chronic congestive heart failure (HCC)   Leg swelling    Discharge instructions    Discharge Instructions    Diet - low sodium heart healthy   Complete by:  As directed    Discharge instructions   Complete by:  As directed    Follow with Primary MD Katherina Mires, MD in 7 days   Get CBC, CMP, 2 view Chest X ray checked  by Primary MD in 5-7 days    Activity: As tolerated with Full fall precautions use walker/cane & assistance as needed  Disposition Home    Diet:   Low carbohydrate - Heart Healthy with strict 1.5 L/day total fluid restriction.  For Heart failure patients - Check your Weight same  time everyday, if you gain over 2 pounds, or you develop in leg swelling, experience more shortness of breath or chest pain, call your Primary MD immediately. Follow Cardiac Low Salt Diet and 1.5 lit/day fluid restriction.  Special Instructions: If you have smoked or chewed Tobacco  in the last 2 yrs please stop smoking, stop any regular Alcohol  and or any Recreational drug use.  On your next visit with your primary care physician please Get Medicines reviewed and adjusted.  Please request your Prim.MD to go over all Hospital Tests and Procedure/Radiological results at the follow up, please get all Hospital records sent to your Prim MD by signing hospital release before you go home.  If you experience worsening of your admission symptoms, develop shortness of breath, life threatening emergency, suicidal or homicidal thoughts you must seek medical attention immediately by calling 911 or calling your MD immediately  if symptoms less severe.  You Must read complete instructions/literature along with all the possible adverse reactions/side effects for all the Medicines you take and that have been prescribed to you. Take any new Medicines after you have completely understood and accpet all the possible adverse reactions/side effects.   Increase activity slowly   Complete by:  As directed       Discharge Medications   Allergies as of 05/10/2018   No Known Allergies     Medication List    STOP taking these medications   aspirin 81 MG chewable tablet   doxycycline 100 MG tablet Commonly known as:  VIBRA-TABS   metoprolol succinate 25 MG 24 hr tablet Commonly known as:  TOPROL-XL     TAKE these medications   amiodarone 200 MG tablet Commonly known as:  PACERONE Take 1 tablet (200 mg total) by mouth daily. Take 200 mg twice a day for 6 days then 200 mg daily What changed:  additional instructions   apixaban 2.5 MG Tabs tablet Commonly known as:  ELIQUIS Take 1 tablet (2.5 mg total) by  mouth 2 (two) times daily. What changed:    medication strength  how much to take   carvedilol 12.5 MG tablet Commonly known as:  COREG Take 1 tablet (12.5 mg total) by mouth 2 (two) times daily. What changed:    medication strength  how much to take   Cholecalciferol 1000 units tablet Take 1,000 Units by mouth daily.  clopidogrel 75 MG tablet Commonly known as:  PLAVIX Take 75 mg by mouth daily.   DRY EYES OP Place 1 drop into the right eye daily as needed (dry eye).   furosemide 80 MG tablet Commonly known as:  LASIX Take 1 tablet (80 mg total) by mouth daily. What changed:    medication strength  how much to take   guaifenesin 100 MG/5ML syrup Commonly known as:  ROBITUSSIN Take 200 mg by mouth 3 (three) times daily as needed for cough.   hydroxypropyl methylcellulose / hypromellose 2.5 % ophthalmic solution Commonly known as:  ISOPTO TEARS / GONIOVISC Place 1 drop into both eyes as needed for dry eyes.   insulin detemir 100 UNIT/ML injection Commonly known as:  LEVEMIR Inject 0.2 mLs (20 Units total) into the skin 2 (two) times daily. What changed:  how much to take   ipratropium-albuterol 0.5-2.5 (3) MG/3ML Soln Commonly known as:  DUONEB Take 3 mLs by nebulization every 6 (six) hours as needed. What changed:  reasons to take this   levalbuterol 1.25 MG/0.5ML nebulizer solution Commonly known as:  XOPENEX Take 1.25 mg by nebulization every 6 (six) hours as needed for shortness of breath.   lovastatin 40 MG tablet Commonly known as:  MEVACOR Take 80 mg by mouth at bedtime.   mometasone-formoterol 200-5 MCG/ACT Aero Commonly known as:  DULERA Inhale 2 puffs into the lungs 2 (two) times daily.   pantoprazole 40 MG tablet Commonly known as:  PROTONIX Take 1 tablet (40 mg total) by mouth daily.   polyethylene glycol packet Commonly known as:  MIRALAX / GLYCOLAX Take 17 g by mouth daily as needed for mild constipation.   PROAIR HFA 108 (90  Base) MCG/ACT inhaler Generic drug:  albuterol Inhale 2 puffs into the lungs every 6 (six) hours as needed for wheezing.   ramipril 2.5 MG capsule Commonly known as:  ALTACE Take 1 capsule (2.5 mg total) by mouth daily.       Follow-up Information    Katherina Mires, MD. Schedule an appointment as soon as possible for a visit in 1 week(s).   Specialty:  Family Medicine Contact information: [place] [address] [phone]        Dixie Dials, MD. Schedule an appointment as soon as possible for a visit in 1 week(s).   Specialty:  Cardiology Contact information: [place] [address] [phone]           Major procedures and Radiology Reports - PLEASE review detailed and final reports thoroughly  -      Dg Chest 2 View  Result Date: 05/06/2018 CLINICAL DATA:  Chest pain, shortness of breath, dry cough EXAM: CHEST - 2 VIEW COMPARISON:  05/09/2017 FINDINGS: Cardiomegaly. Increased markings in the lung bases could reflect atelectasis or scarring. No effusions. No acute bony abnormality. IMPRESSION: Cardiomegaly.  Bibasilar atelectasis or scarring. Electronically Signed   By: Rolm Baptise M.D.   On: 05/06/2018 10:48    Micro Results    Recent Results (from the past 240 hour(s))  MRSA PCR Screening     Status: None   Collection Time: 05/07/18  4:26 AM  Result Value Ref Range Status   MRSA by PCR NEGATIVE NEGATIVE Final    Comment:        The GeneXpert MRSA Assay (FDA approved for NASAL specimens only), is one component of a comprehensive MRSA colonization surveillance program. It is not intended to diagnose  MRSA infection nor to guide or monitor treatment for MRSA infections. Performed at [facility], [place] [address], [place], [place] [address]     Today   Subjective   Giani Betzold today has no headache,no chest abdominal pain,no new weakness tingling or numbness, feels much better wants to  go home today.     Objective   Blood pressure (!) 122/57, pulse 71, temperature 98.8 F (37.1 C), temperature source Oral, resp. rate (!) 24, weight 90 kg (198 lb 6.4 oz), SpO2 99 %.   Intake/Output Summary (Last 24 hours) at 05/10/2018 1235 Last data filed at 05/10/2018 0520 Gross per 24 hour  Intake 582 ml  Output 1700 ml  Net -1118 ml    Exam Awake Alert,   No new F.N deficits, Normal affect [place].AT,PERRAL Supple Neck,No JVD, No cervical lymphadenopathy appriciated.  Symmetrical Chest wall movement, Good air movement bilaterally, CTAB RRR,No Gallops,Rubs or new Murmurs, No Parasternal Heave +ve B.Sounds, Abd Soft, Non tender, No organomegaly appriciated, No rebound -guarding or rigidity. No Cyanosis, Clubbing, 1+ LEG edema, No new Rash or bruise   Data Review   CBC w Diff:  Lab Results  Component Value Date   WBC 7.5 05/10/2018   HGB 11.8 (L) 05/10/2018   HCT 37.1 (L) 05/10/2018   PLT 97 (L) 05/10/2018   LYMPHOPCT 27 05/09/2017   MONOPCT 13 05/09/2017   EOSPCT 3 05/09/2017   BASOPCT 0 05/09/2017    CMP:  Lab Results  Component Value Date   NA 139 05/10/2018   NA 144 07/06/2016   K 3.9 05/10/2018   CL 98 05/10/2018   CO2 30 05/10/2018   BUN 31 (H) 05/10/2018   BUN 23 (A) 07/06/2016   CREATININE 1.73 (H) 05/10/2018   GLU 152 07/06/2016   PROT 6.4 (L) 05/09/2017   ALBUMIN 3.3 (L) 05/09/2017   BILITOT 0.9 05/09/2017   ALKPHOS 77 05/09/2017   AST 14 (L) 05/09/2017   ALT 10 (L) 05/09/2017  .   Total Time in preparing paper work, data evaluation and todays exam - 17 minutes  Lala Lund M.D on 05/10/2018 at 12:35 PM  [facility]   Office  [phone]

## 2018-05-10 NOTE — Progress Notes (Signed)
Net negative overnight. Now on po meds - start lasix 80 mg po daily. Vitals stable - maintaining sinus rhythm. No further cardiac suggestions at this time.  At the end of our conversation today, the patient indicated that he has in fact been a patient of Dr. Doylene Canard over the past year.  There is no clear documentation in the chart of this and they had not made that apparent.  We will reach out to Dr. Doylene Canard as the family wishes to follow-up with him.  [facility] will sign off.   Medication Recommendations:  Amiodarone 400 mg daily x 7 days total, then decrease to 200 mg daily. Eliquis 2.5 mg BID, Coreg 12.5 mg BID, Plavix 75 mg daily (no aspirin), Lasix 80 mg daily, pravastatin 80 mg daily Other recommendations (labs, testing, etc):  None Follow up as an outpatient:  Follow-up with Dr. Doylene Canard per family wishes  Pixie Casino, MD, [facility], [place] Director of the [place] of the American Board of Clinical Lipidology Attending Cardiologist   Direct Dial: [phone]  Fax: [phone]  Website:  [email or web address]

## 2018-06-12 ENCOUNTER — Emergency Department (HOSPITAL_COMMUNITY)
Admission: EM | Admit: 2018-06-12 | Discharge: 2018-06-12 | Disposition: A | Discharge status: Home / Self Care | Payer: Medicare Other | Attending: Emergency Medicine | Admitting: Emergency Medicine

## 2018-06-12 ENCOUNTER — Emergency Department (HOSPITAL_COMMUNITY): Payer: Medicare Other

## 2018-06-12 DIAGNOSIS — E119 Type 2 diabetes mellitus without complications: Secondary | ICD-10-CM | POA: Insufficient documentation

## 2018-06-12 DIAGNOSIS — Z87891 Personal history of nicotine dependence: Secondary | ICD-10-CM | POA: Insufficient documentation

## 2018-06-12 DIAGNOSIS — I5043 Acute on chronic combined systolic (congestive) and diastolic (congestive) heart failure: Secondary | ICD-10-CM | POA: Insufficient documentation

## 2018-06-12 DIAGNOSIS — R079 Chest pain, unspecified: Secondary | ICD-10-CM | POA: Diagnosis not present

## 2018-06-12 DIAGNOSIS — Z79899 Other long term (current) drug therapy: Secondary | ICD-10-CM | POA: Diagnosis not present

## 2018-06-12 DIAGNOSIS — J441 Chronic obstructive pulmonary disease with (acute) exacerbation: Secondary | ICD-10-CM | POA: Diagnosis not present

## 2018-06-12 LAB — CBC WITH DIFFERENTIAL/PLATELET
Abs Immature Granulocytes: 0 10*3/uL (ref 0.0–0.1)
Basophils Absolute: 0 10*3/uL (ref 0.0–0.1)
Basophils Relative: 1 %
EOS ABS: 0.1 10*3/uL (ref 0.0–0.7)
Eosinophils Relative: 2 %
HCT: 39.2 % (ref 39.0–52.0)
Hemoglobin: 12.2 g/dL — ABNORMAL LOW (ref 13.0–17.0)
IMMATURE GRANULOCYTES: 1 %
LYMPHS ABS: 2.6 10*3/uL (ref 0.7–4.0)
LYMPHS PCT: 40 %
MCH: 28.8 pg (ref 26.0–34.0)
MCHC: 31.1 g/dL (ref 30.0–36.0)
MCV: 92.7 fL (ref 78.0–100.0)
Monocytes Absolute: 0.8 10*3/uL (ref 0.1–1.0)
Monocytes Relative: 12 %
NEUTROS PCT: 44 %
Neutro Abs: 3 10*3/uL (ref 1.7–7.7)
Platelets: 121 10*3/uL — ABNORMAL LOW (ref 150–400)
RBC: 4.23 MIL/uL (ref 4.22–5.81)
RDW: 13.2 % (ref 11.5–15.5)
WBC: 6.6 10*3/uL (ref 4.0–10.5)

## 2018-06-12 LAB — I-STAT TROPONIN, ED: Troponin i, poc: 0.01 ng/mL (ref 0.00–0.08)

## 2018-06-12 LAB — BASIC METABOLIC PANEL
ANION GAP: 7 (ref 5–15)
BUN: 22 mg/dL (ref 8–23)
CO2: 29 mmol/L (ref 22–32)
CREATININE: 1.66 mg/dL — AB (ref 0.61–1.24)
Calcium: 8.7 mg/dL — ABNORMAL LOW (ref 8.9–10.3)
Chloride: 104 mmol/L (ref 98–111)
GFR calc non Af Amer: 35 mL/min — ABNORMAL LOW (ref >60)
GFR, EST AFRICAN AMERICAN: 41 mL/min — AB (ref >60)
Glucose, Bld: 162 mg/dL — ABNORMAL HIGH (ref 70–99)
POTASSIUM: 4.3 mmol/L (ref 3.5–5.1)
SODIUM: 140 mmol/L (ref 135–145)

## 2018-06-12 MED ORDER — NITROGLYCERIN 0.4 MG SL SUBL: 0.4000 mg | SUBLINGUAL_TABLET | SUBLINGUAL | 0 refills | Status: DC | PRN

## 2018-06-12 MED ORDER — ASPIRIN 81 MG PO CHEW: 324.0000 mg | CHEWABLE_TABLET | Freq: Once | ORAL | Status: DC

## 2018-06-12 MED ORDER — ACETAMINOPHEN 500 MG PO TABS
1000.0000 mg | ORAL_TABLET | Freq: Once | ORAL | Status: AC
  Administered 2018-06-12: 1000 mg via ORAL
  Filled 2018-06-12: qty 2

## 2018-06-12 MED ORDER — NITROGLYCERIN 0.4 MG SL SUBL
0.4000 mg | SUBLINGUAL_TABLET | SUBLINGUAL | Status: DC | PRN
Start: 2018-06-12 — End: 2018-06-12
  Filled 2018-06-12: qty 1

## 2018-06-12 MED ORDER — NITROGLYCERIN 0.4 MG/HR TD PT24
0.4000 mg | MEDICATED_PATCH | Freq: Every day | TRANSDERMAL | Status: DC
  Filled 2018-06-12: qty 1

## 2018-06-12 NOTE — ED Notes (Signed)
ED Provider at bedside. 

## 2018-06-12 NOTE — ED Notes (Signed)
Patient verbalizes understanding of discharge instructions. Opportunity for questioning and answers were provided. Pt discharged from ED. 

## 2018-06-12 NOTE — Discharge Instructions (Addendum)
RETURN TO ER IF YOUR CHEST PAIN OR BREATHING PROBLEMS WORSEN. FOLLOW UP WITH DR. Morley IN THE CLINIC.

## 2018-06-12 NOTE — Consult Note (Signed)
Referring Physician:  Fayez Sturgell. is an 84 y.o. male.                       Chief Complaint: Chest pain  HPI: 84 year old male with PMH of chest pain, paroxysmal atrial fibrillation with CHA2DS2VASc score of 5, Type 2 DM, COPD, Stroke and dilated and ischemic cardiomyopathy has recurrent chest pain. Currently he is free of chest pain and denies hospital admission or 24 hour observation. Wife and daughter are here with patient and they are in agreement and want medical therapy for now.  Past Medical History:  Diagnosis Date  . Asthma   . Cardiomyopathy ([place])    a. 2D echo 2012: EF 30-35%, diffuse HK, grade 1 DD, trivial pericardial effusion.  Marland Kitchen COPD (chronic obstructive pulmonary disease) ([place])   . Diabetes mellitus type 2 in nonobese (HCC)   . Former tobacco use   . LBBB (left bundle branch block)   . Prostate cancer ([place])   . Stroke [facility])       Past Surgical History:  Procedure Laterality Date  . CARDIOVERSION N/A 05/11/2017   Procedure: CARDIOVERSION;  Surgeon: Acie Fredrickson Wonda Cheng, MD;  Location: [facility] ENDOSCOPY;  Service: Cardiovascular;  Laterality: N/A;  . CARDIOVERSION N/A 05/09/2018   Procedure: CARDIOVERSION;  Surgeon: Dorothy Spark, MD;  Location[facility] ENDOSCOPY;  Service: Cardiovascular;  Laterality: N/A;  . PROSTATE SURGERY    . TEE WITHOUT CARDIOVERSION N/A 05/11/2017   Procedure: TRANSESOPHAGEAL ECHOCARDIOGRAM (TEE);  Surgeon: Acie Fredrickson Wonda Cheng, MD;  Location: [facility] ENDOSCOPY;  Service: Cardiovascular;  Laterality: N/A;  . TEE WITHOUT CARDIOVERSION N/A 05/09/2018   Procedure: TRANSESOPHAGEAL ECHOCARDIOGRAM (TEE);  Surgeon: Dorothy Spark, MD;  Location: [facility] ENDOSCOPY;  Service: Cardiovascular;  Laterality: N/A;    Family History  Problem Relation Age of Onset  . Diabetes Maternal Aunt    Social History:  reports that he quit smoking about 60 years ago. His smoking use included cigarettes. He has never used smokeless tobacco. He reports that he does not drink alcohol  or use drugs.  Allergies: No Known Allergies   (Not in a hospital admission)  Results for orders placed or performed during the hospital encounter of 06/12/18 (from the past 48 hour(s))  Basic metabolic panel     Status: Abnormal   Collection Time: 06/12/18  3:30 AM  Result Value Ref Range   Sodium 140 135 - 145 mmol/L   Potassium 4.3 3.5 - 5.1 mmol/L   Chloride 104 98 - 111 mmol/L   CO2 29 22 - 32 mmol/L   Glucose, Bld 162 (H) 70 - 99 mg/dL   BUN 22 8 - 23 mg/dL   Creatinine, Ser 1.66 (H) 0.61 - 1.24 mg/dL   Calcium 8.7 (L) 8.9 - 10.3 mg/dL   GFR calc non Af Amer 35 (L) >60 mL/min   GFR calc Af Amer 41 (L) >60 mL/min    Comment: (NOTE) The eGFR has been calculated using the CKD EPI equation. This calculation has not been validated in all clinical situations. eGFR's persistently <60 mL/min signify possible Chronic Kidney Disease.    Anion gap 7 5 - 15    Comment: Performed at [facility] [address] [place], [place] [address]  CBC with Differential     Status: Abnormal   Collection Time: 06/12/18  3:30 AM  Result Value Ref Range   WBC 6.6 4.0 - 10.5 K/uL   RBC 4.23 4.22 - 5.81  MIL/uL   Hemoglobin 12.2 (L) 13.0 - 17.0 g/dL   HCT 39.2 39.0 - 52.0 %   MCV 92.7 78.0 - 100.0 fL   MCH 28.8 26.0 - 34.0 pg   MCHC 31.1 30.0 - 36.0 g/dL   RDW 13.2 11.5 - 15.5 %   Platelets 121 (L) 150 - 400 K/uL   Neutrophils Relative % 44 %   Neutro Abs 3.0 1.7 - 7.7 K/uL   Lymphocytes Relative 40 %   Lymphs Abs 2.6 0.7 - 4.0 K/uL   Monocytes Relative 12 %   Monocytes Absolute 0.8 0.1 - 1.0 K/uL   Eosinophils Relative 2 %   Eosinophils Absolute 0.1 0.0 - 0.7 K/uL   Basophils Relative 1 %   Basophils Absolute 0.0 0.0 - 0.1 K/uL   Immature Granulocytes 1 %   Abs Immature Granulocytes 0.0 0.0 - 0.1 K/uL    Comment: Performed at [facility] [address], [place], [place] [address]  I-stat troponin, ED     Status: None   Collection Time: 06/12/18  3:47 AM  Result Value  Ref Range   Troponin i, poc 0.01 0.00 - 0.08 ng/mL   Comment 3            Comment: Due to the release kinetics of cTnI, a negative result within the first hours of the onset of symptoms does not rule out myocardial infarction with certainty. If myocardial infarction is still suspected, repeat the test at appropriate intervals.    Dg Chest 2 View  Result Date: 06/12/2018 CLINICAL DATA:  Shortness of breath for 1 day.  History of COPD. EXAM: CHEST - 2 VIEW COMPARISON:  Chest radiograph May 06, 2018 FINDINGS: Cardiac silhouette is mildly enlarged. Calcified aortic arch. No pleural effusion or focal consolidation. Mild hyperinflation. No pneumothorax. Soft tissue planes and included osseous structures are non suspicious. Upper thoracic dextroscoliosis. IMPRESSION: 1. Mild hyperinflation without focal consolidation. 2. Mild cardiomegaly. 3.  Aortic Atherosclerosis (ICD10-I70.0). Electronically Signed   By: Elon Alas M.D.   On: 06/12/2018 02:49    Review Of Systems Constitutional: No fever, chills, weight loss or gain. Eyes: Positive vision change, wears glasses. No discharge or pain. Ears: Positive hearing loss, No tinnitus. Respiratory: No asthma, COPD, pneumonias. Positive shortness of breath. No hemoptysis. Cardiovascular: Positive chest pain, palpitation, leg edema. Gastrointestinal: Positive nausea, vomiting, diarrhea, constipation. No GI bleed. No hepatitis. Genitourinary: No dysuria, hematuria, kidney stone. No incontinance. Neurological: No headache, stroke, seizures.  Psychiatry: No psych facility admission for anxiety, depression, suicide. No detox. Skin: No rash. Musculoskeletal: Positive joint pain, no fibromyalgia. No neck pain, back pain. Lymphadenopathy: No lymphadenopathy. Hematology: No anemia or easy bruising.   Blood pressure 129/61, pulse (!) 57, temperature 98.2 F (36.8 C), temperature source Oral, resp. rate 17, SpO2 100 %. There is no height or weight on  file to calculate BMI. General appearance: alert, cooperative, appears stated age and no distress Head: Normocephalic, atraumatic. Eyes: Brown eyes, pink conjunctiva, corneas clear. PERRL, EOM's intact. Neck: No adenopathy, no carotid bruit, no JVD, supple, symmetrical, trachea midline and thyroid not enlarged. Resp: Clear to auscultation bilaterally. Cardio: Regular rate and rhythm, S1, S2 normal, II/VI systolic murmur, no click, rub or gallop GI: Soft, non-tender; bowel sounds normal; no organomegaly. Extremities: Trace edema, cyanosis or clubbing. Skin: Warm and dry.  Neurologic: Alert and oriented X 3, thick speech, normal strength.   Assessment/Plan Unstable angina Hyperlipidemia; Dilated and ischemic cardiomyopathy Paroxysmal atrial fibrillation Type 2 DM  S/P stroke COPD S/P prostate cancer  Place in observation Patient refuses. Add NTG patch for chest pain control. F/U in office in 5 days.  Birdie Riddle, MD  06/12/2018, 6:27 AM

## 2018-06-12 NOTE — ED Provider Notes (Signed)
[facility] DEPARTMENT Provider Note   CSN: [id number] Arrival date & time: 06/12/18  0135     History   Chief Complaint Chief Complaint  Patient presents with  . Chest Pain    HPI Dustin Leonard. is a 84 y.o. male.  84 year old male extensive past medical history including CHF, COPD, type 2 diabetes mellitus,-year-old flutter on Eliquis who presents with chest pain.  Around 11:30 PM tonight, the patient began having chest pain while at rest.  He is not able to characterize the pain but states that it was severe enough that he wanted to come to the hospital.  The pain eventually resolved and currently he denies any pain.  He had associated nausea, no use of breath or diaphoresis.  He denies any fevers or cough/cold symptoms.  He is compliant with medications.  No recent travel.  No change in lower extremity edema.  Had aspirin by EMS prior to arrival.  The history is provided by the patient.  Chest Pain      Past Medical History:  Diagnosis Date  . Asthma   . Cardiomyopathy ([place])    a. 2D echo 2012: EF 30-35%, diffuse HK, grade 1 DD, trivial pericardial effusion.  Marland Kitchen COPD (chronic obstructive pulmonary disease) ([place])   . Diabetes mellitus type 2 in nonobese (H[facility])   . Former tobacco use   . LBBB (left bundle branch block)   . Prostate cancer ([place])   . Stroke [facility])     Patient Active Problem List   Diagnosis Date Noted  . Leg swelling   . Acute on chronic congestive heart failure ([place])   . Palliative care encounter   . Acute respiratory failure ([place]) 05/06/2018  . Chronic kidney disease 05/06/2018  . Atrial fibrillation with rapid ventricular response ([place]) 05/06/2018  . Poorly controlled type 2 diabetes mellitus with renal complication ([place])   . Acute exacerbation of chronic obstructive pulmonary disease (COPD) ([place]) 05/10/2017  . Cardiomyopathy ([place]) 05/10/2017  . Acute on chronic combined systolic and diastolic CHF (congestive heart  failure) ([place]) 05/10/2017  . Acute kidney injury superimposed on chronic kidney disease ([place]) 05/10/2017  . Demand ischemia ([place]) 05/10/2017  . Wheezing 05/10/2017  . LBBB (left bundle branch block) 05/10/2017  . Asthma 05/10/2017  . Atrial flutter with rapid ventricular response ([place]) 05/09/2017  . COPD exacerbation ([place]) 06/30/2016  . History of TIA (transient ischemic attack) 06/30/2016  . UTI (lower urinary tract infection) 06/30/2016  . Type 2 diabetes mellitus with hyperglycemia, with long-term current use of insulin ([place]) 06/30/2016  . History of stroke 09/22/2014  . Cardiomyopathy, ischemic 04/13/2013  . GLAUCOMA 12/02/2007  . COPD with emphysema ([place]) 12/02/2007  . PROSTATE CANCER, HX OF 12/02/2007    Past Surgical History:  Procedure Laterality Date  . CARDIOVERSION N/A 05/11/2017   Procedure: CARDIOVERSION;  Surgeon: Acie Fredrickson Wonda Cheng, MD;  Location[facility] ENDOSCOPY;  Service: Cardiovascular;  Laterality: N/A;  . CARDIOVERSION N/A 05/09/2018   Procedure: CARDIOVERSION;  Surgeon: Dorothy Spark, MD;  Location: [facility] ENDOSCOPY;  Service: Cardiovascular;  Laterality: N/A;  . PROSTATE SURGERY    . TEE WITHOUT CARDIOVERSION N/A 05/11/2017   Procedure: TRANSESOPHAGEAL ECHOCARDIOGRAM (TEE);  Surgeon: Acie Fredrickson Wonda Cheng, MD;  Location: [facility] ENDOSCOPY;  Service: Cardiovascular;  Laterality: N/A;  . TEE WITHOUT CARDIOVERSION N/A 05/09/2018   Procedure: TRANSESOPHAGEAL ECHOCARDIOGRAM (TEE);  Surgeon: Dorothy Spark, MD;  Location: [facility];  Service: Cardiovascular;  Laterality: N/A;  Home Medications    Prior to Admission medications   Medication Sig Start Date End Date Taking? Authorizing Provider  amiodarone (PACERONE) 200 MG tablet Take 1 tablet (200 mg total) by mouth daily. Take 200 mg twice a day for 6 days then 200 mg daily Patient taking differently: Take 100 mg by mouth daily.  05/10/18  Yes Thurnell Lose, MD  amLODipine (NORVASC) 2.5 MG tablet Take 2.5 mg by  mouth daily.   Yes [provider]  apixaban (ELIQUIS) 2.5 MG TABS tablet Take 1 tablet (2.5 mg total) by mouth 2 (two) times daily. 05/10/18  Yes Thurnell Lose, MD  Artificial Tear Ointment (DRY EYES OP) Place 1 drop into the right eye daily as needed (dry eye).   Yes [provider]  carvedilol (COREG) 12.5 MG tablet Take 1 tablet (12.5 mg total) by mouth 2 (two) times daily. 05/10/18  Yes Thurnell Lose, MD  Cholecalciferol 1000 units tablet Take 1,000 Units by mouth daily.   Yes [provider]  clopidogrel (PLAVIX) 75 MG tablet Take 75 mg by mouth daily. 02/27/18  Yes [provider]  guaifenesin (ROBITUSSIN) 100 MG/5ML syrup Take 200 mg by mouth 3 (three) times daily as needed for cough.   Yes [provider]  hydroxypropyl methylcellulose / hypromellose (ISOPTO TEARS / GONIOVISC) 2.5 % ophthalmic solution Place 1 drop into both eyes as needed for dry eyes.   Yes [provider]  insulin detemir (LEVEMIR) 100 UNIT/ML injection Inject 0.2 mLs (20 Units total) into the skin 2 (two) times daily. Patient taking differently: Inject 27 Units into the skin 2 (two) times daily.  05/15/17  Yes Winfrey, Alcario Drought, MD  ipratropium-albuterol (DUONEB) 0.5-2.5 (3) MG/3ML SOLN Take 3 mLs by nebulization every 6 (six) hours as needed. Patient taking differently: Take 3 mLs by nebulization every 6 (six) hours as needed (Short of breath or wheezing).  07/03/16  Yes Hoffman, Jessica Ratliff, DO  lovastatin (MEVACOR) 40 MG tablet Take 80 mg by mouth at bedtime.  04/18/18  Yes [provider]  polyethylene glycol (MIRALAX / GLYCOLAX) packet Take 17 g by mouth daily as needed for mild constipation.   Yes [provider]  PROAIR HFA 108 (90 Base) MCG/ACT inhaler Inhale 2 puffs into the lungs every 6 (six) hours as needed for wheezing. 04/30/18  Yes [provider]  furosemide (LASIX) 80 MG tablet Take 1 tablet (80 mg total) by mouth  daily. Patient taking differently: Take 80 mg by mouth 3 (three) times a week.  05/10/18   Thurnell Lose, MD  levalbuterol (XOPENEX) 1.25 MG/0.5ML nebulizer solution Take 1.25 mg by nebulization every 6 (six) hours as needed for shortness of breath. Patient not taking: Reported on 05/06/2018 05/15/17   Kathrene Alu, MD  mometasone-formoterol [facility]) 200-5 MCG/ACT AERO Inhale 2 puffs into the lungs 2 (two) times daily. Patient not taking: Reported on 06/12/2018 05/15/17   Kathrene Alu, MD  nitroGLYCERIN (NITROSTAT) 0.4 MG SL tablet Place 1 tablet (0.4 mg total) under the tongue every 5 (five) minutes as needed for chest pain. 06/12/18   Calin Fantroy, Wenda Overland, MD  pantoprazole (PROTONIX) 40 MG tablet Take 1 tablet (40 mg total) by mouth daily. Patient not taking: Reported on 06/12/2018 05/10/18   Thurnell Lose, MD  ramipril (ALTACE) 2.5 MG capsule Take 1 capsule (2.5 mg total) by mouth daily. Patient not taking: Reported on 05/06/2018 05/16/17   Kathrene Alu, MD  Family History Family History  Problem Relation Age of Onset  . Diabetes Maternal Aunt     Social History Social History   Tobacco Use  . Smoking status: Former Smoker    Types: Cigarettes    Last attempt to quit: 10/23/1957    Years since quitting: 60.6  . Smokeless tobacco: Never Used  . Tobacco comment: Smoked 1/2 ppd x 23 years  Substance Use Topics  . Alcohol use: No  . Drug use: No     Allergies   Patient has no known allergies.   Review of Systems Review of Systems  Cardiovascular: Positive for chest pain.   All other systems reviewed and are negative except that which was mentioned in HPI   Physical Exam Updated Vital Signs BP 129/61   Pulse (!) 57   Temp 98.2 F (36.8 C) (Oral)   Resp 17   SpO2 100%   Physical Exam  Constitutional: He is oriented to person, place, and time. He appears well-developed and well-nourished. No distress.  HENT:  Head: Normocephalic and atraumatic.   Moist mucous membranes  Eyes: Pupils are equal, round, and reactive to light. Conjunctivae are normal.  Neck: Neck supple.  Cardiovascular: Normal rate, regular rhythm and normal heart sounds.  No murmur heard. Pulmonary/Chest: Effort normal and breath sounds normal.  Abdominal: Soft. Bowel sounds are normal. He exhibits no distension. There is no tenderness.  Musculoskeletal:       Right lower leg: He exhibits edema.       Left lower leg: He exhibits edema.  Mild BLE edema to ankles  Neurological: He is alert and oriented to person, place, and time.  Fluent speech  Skin: Skin is warm and dry.  Psychiatric: He has a normal mood and affect. Judgment normal.  Nursing note and vitals reviewed.    ED Treatments / Results  Labs (all labs ordered are listed, but only abnormal results are displayed) Labs Reviewed  BASIC METABOLIC PANEL - Abnormal; Notable for the following components:      Result Value   Glucose, Bld 162 (*)    Creatinine, Ser 1.66 (*)    Calcium 8.7 (*)    GFR calc non Af Amer 35 (*)    GFR calc Af Amer 41 (*)    All other components within normal limits  CBC WITH DIFFERENTIAL/PLATELET - Abnormal; Notable for the following components:   Hemoglobin 12.2 (*)    Platelets 121 (*)    All other components within normal limits  I-STAT TROPONIN, ED    EKG EKG Interpretation  Date/Time:  Wednesday June 12 2018 01:53:48 EDT Ventricular Rate:  60 PR Interval:  180 QRS Duration: 154 QT Interval:  468 QTC Calculation: 468 R Axis:   41 Text Interpretation:  Normal sinus rhythm Left bundle branch block Abnormal ECG similar to previous Confirmed by Theotis Burrow [phone]) on 06/12/2018 2:01:46 AM   Radiology Dg Chest 2 View  Result Date: 06/12/2018 CLINICAL DATA:  Shortness of breath for 1 day.  History of COPD. EXAM: CHEST - 2 VIEW COMPARISON:  Chest radiograph May 06, 2018 FINDINGS: Cardiac silhouette is mildly enlarged. Calcified aortic arch. No pleural  effusion or focal consolidation. Mild hyperinflation. No pneumothorax. Soft tissue planes and included osseous structures are non suspicious. Upper thoracic dextroscoliosis. IMPRESSION: 1. Mild hyperinflation without focal consolidation. 2. Mild cardiomegaly. 3.  Aortic Atherosclerosis (ICD10-I70.0). Electronically Signed   By: Elon Alas M.D.   On: 06/12/2018 02:49    Procedures Procedures (including  critical care time)  Medications Ordered in ED Medications  nitroGLYCERIN (NITRODUR - Dosed in mg/24 hr) patch 0.4 mg (0 mg Transdermal Hold 06/12/18 0652)  acetaminophen (TYLENOL) tablet 1,000 mg (1,000 mg Oral Given 06/12/18 0323)     Initial Impression / Assessment and Plan / ED Course  I have reviewed the triage vital signs and the nursing notes.  Pertinent labs & imaging results that were available during my care of the patient were reviewed by me and considered in my medical decision making (see chart for details).     PT comfortable and chest pain-free on arrival. VSS, EKG without acute ischemic changes.  He later complained of left shoulder pain but this resolved as well.  Lab work today shows negative troponin, creatinine 1.6, reassuring CBC.  X-ray negative acute.  Because the patient follows with Dr. Doylene Canard in the clinic, I contacted him to discuss presentation. He evaluated pt in ED and discussed obs admission for chest pain work up. PT and family have chosen medical therapy only for now given his age.  I think this is a reasonable plan given his age and comorbidities.  They understand options and risks. He will see Dr. Doylene Canard in clinic in 5 days and I have written him for NTG to use as needed, cautioned on side effects.  Patient and family voiced understanding of return precautions.  Final Clinical Impressions(s) / ED Diagnoses   Final diagnoses:  Chest pain, unspecified type    ED Discharge Orders         Ordered    nitroGLYCERIN (NITROSTAT) 0.4 MG SL tablet  Every 5  min PRN     06/12/18 0708           Kahdijah Errickson, Wenda Overland, MD 06/12/18 [phone]

## 2018-06-12 NOTE — ED Notes (Signed)
Patient transported to X-ray 

## 2018-06-12 NOTE — ED Notes (Signed)
Patient denies chest pain at this time-Monique,RN

## 2018-06-12 NOTE — ED Triage Notes (Signed)
Patient arrives via EMS from home with family; Patient is a&o x 4 on arrival. Pt c/o chest pain for about 10 mins at 2330 last night; patient reports new change in medication dosage that caused some n/v last am; Patient denies pain on arrival; pt received 324 ASA en route; pt has hx of Afib; pt on Plavix; Patient use cane/walker at home for [facility]

## 2019-12-07 ENCOUNTER — Ambulatory Visit: Payer: Medicare Other | Attending: Internal Medicine

## 2019-12-07 DIAGNOSIS — Z23 Encounter for immunization: Secondary | ICD-10-CM | POA: Insufficient documentation

## 2019-12-07 NOTE — Progress Notes (Signed)
   [id number] Vaccination Clinic  Name:  Cotey Ham.    MRN: [id number] DOB: [birth year]  12/07/2019  Mr. Aber was observed post Covid-19 immunization for 15 minutes without incidence. He was provided with Vaccine Information Sheet and instruction to access the V-Safe system.   Mr. Hockersmith was instructed to call 911 with any severe reactions post vaccine: Marland Kitchen Difficulty breathing  . Swelling of your face and throat  . A fast heartbeat  . A bad rash all over your body  . Dizziness and weakness    Immunizations Administered    Name Date Dose VIS Date Route   Pfizer COVID-19 Vaccine 12/07/2019 10:16 AM 0.3 mL 10/03/2019 Intramuscular   Manufacturer: [place]   Lot: [id number]   [place]: [id number]

## 2019-12-30 ENCOUNTER — Ambulatory Visit: Payer: Medicare Other | Attending: Internal Medicine

## 2019-12-30 DIAGNOSIS — Z23 Encounter for immunization: Secondary | ICD-10-CM | POA: Insufficient documentation

## 2019-12-30 NOTE — Progress Notes (Signed)
   [id number] Vaccination Clinic  Name:  Dustin Leonard.    MRN: [id number] DOB: 1929-04-15  12/30/2019  [name] was observed post Covid-19 immunization for 15 minutes without incident. He was provided with Vaccine Information Sheet and instruction to access the V-Safe system.   [name] was instructed to call 911 with any severe reactions post vaccine: Marland Kitchen Difficulty breathing  . Swelling of face and throat  . A fast heartbeat  . A bad rash all over body  . Dizziness and weakness   Immunizations Administered    Name Date Dose VIS Date Route   Pfizer COVID-19 Vaccine 12/30/2019 12:00 PM 0.3 mL 10/03/2019 Intramuscular   Manufacturer: [place]   Lot: [id number]   [place]: [id number]

## 2022-06-23 DEATH — deceased
